# Patient Record
Sex: Male | Born: 1955
Health system: Southern US, Community
[De-identification: ages and names within clinical notes are randomized; demographics above are authoritative.]

## PROBLEM LIST (undated history)

## (undated) DIAGNOSIS — K219 Gastro-esophageal reflux disease without esophagitis: Secondary | ICD-10-CM

## (undated) DIAGNOSIS — E785 Hyperlipidemia, unspecified: Secondary | ICD-10-CM

## (undated) DIAGNOSIS — E1149 Type 2 diabetes mellitus with other diabetic neurological complication: Secondary | ICD-10-CM

## (undated) DIAGNOSIS — F419 Anxiety disorder, unspecified: Secondary | ICD-10-CM

## (undated) DIAGNOSIS — J3089 Other allergic rhinitis: Secondary | ICD-10-CM

## (undated) DIAGNOSIS — D61818 Other pancytopenia: Secondary | ICD-10-CM

## (undated) DIAGNOSIS — K8020 Calculus of gallbladder without cholecystitis without obstruction: Secondary | ICD-10-CM

## (undated) HISTORY — DX: Other pancytopenia: D61.818

## (undated) HISTORY — DX: Type 2 diabetes mellitus with other diabetic neurological complication: E11.49

## (undated) HISTORY — DX: Calculus of gallbladder without cholecystitis without obstruction: K80.20

## (undated) HISTORY — DX: Hyperlipidemia, unspecified: E78.5

## (undated) HISTORY — DX: Anxiety disorder, unspecified: F41.9

## (undated) HISTORY — DX: Gastro-esophageal reflux disease without esophagitis: K21.9

## (undated) HISTORY — PX: OTHER SURGICAL HISTORY: SHX169

## (undated) HISTORY — DX: Other allergic rhinitis: J30.89

## (undated) SURGERY — Surgical Case
Anesthesia: *Unknown

---

## 2004-12-12 ENCOUNTER — Ambulatory Visit: Payer: Self-pay | Admitting: Family Medicine

## 2004-12-15 ENCOUNTER — Ambulatory Visit: Payer: Self-pay | Admitting: Internal Medicine

## 2004-12-25 ENCOUNTER — Ambulatory Visit: Payer: Self-pay | Admitting: Rheumatology

## 2006-04-17 ENCOUNTER — Ambulatory Visit: Payer: Self-pay | Admitting: Gastroenterology

## 2008-04-14 ENCOUNTER — Ambulatory Visit: Payer: Self-pay | Admitting: Unknown Physician Specialty

## 2008-04-15 ENCOUNTER — Ambulatory Visit: Payer: Self-pay | Admitting: Gastroenterology

## 2009-07-29 ENCOUNTER — Emergency Department: Payer: Self-pay | Admitting: Emergency Medicine

## 2009-08-08 ENCOUNTER — Emergency Department: Payer: Self-pay | Admitting: Emergency Medicine

## 2010-05-29 IMAGING — CT CT HEAD WITHOUT CONTRAST
2 series · 16 of 30 positions shown, 20 images · non-contrast
Comparison: none

REASON FOR EXAM: fall AMS
COMMENTS:   May transport without cardiac monitor

[Series 2: without · axial · non-contrast · 0.45mm/px · z∈[-175,-55]mm · 13 of 30 slices shown, 17 images]
[im 3/30  brain]
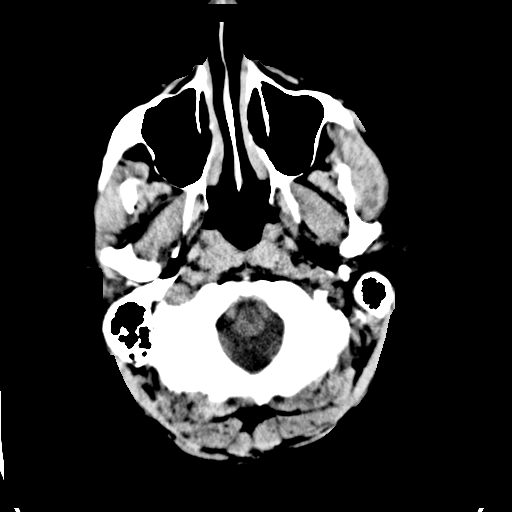
[im 3/30  bone]
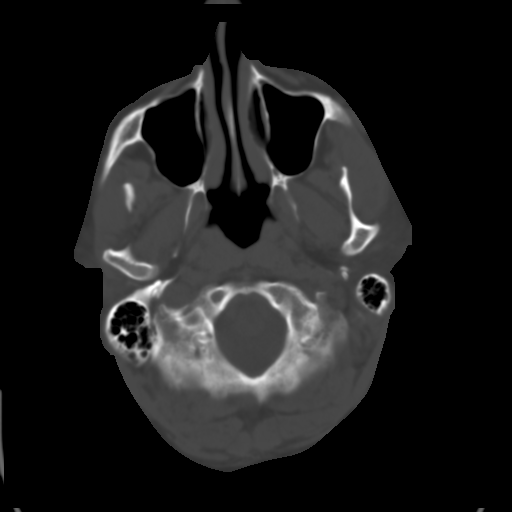
[im 5/30  brain]
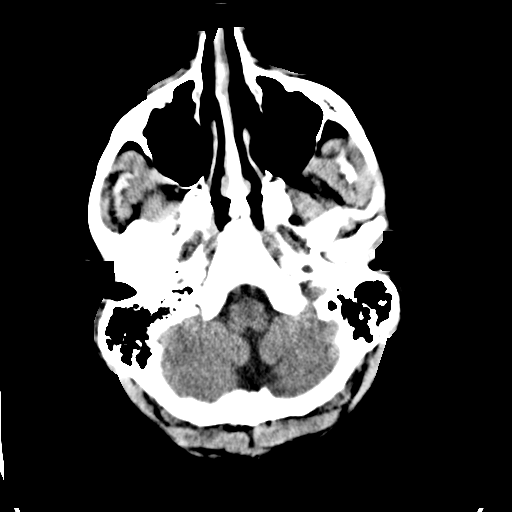
[im 7/30  brain]
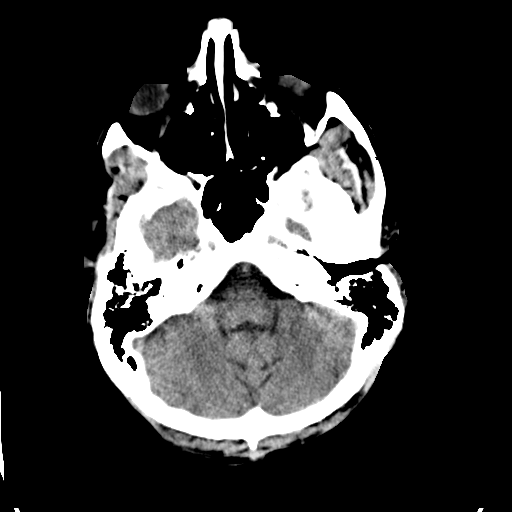
[im 9/30  brain]
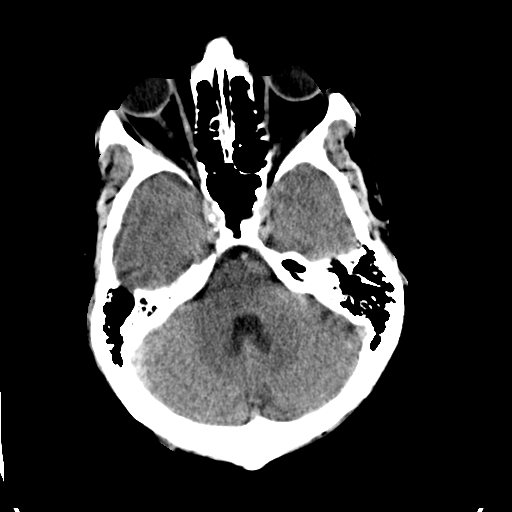
[im 11/30  brain]
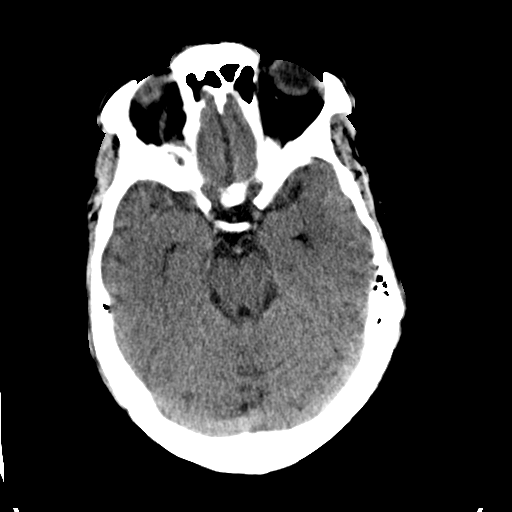
[im 11/30  bone]
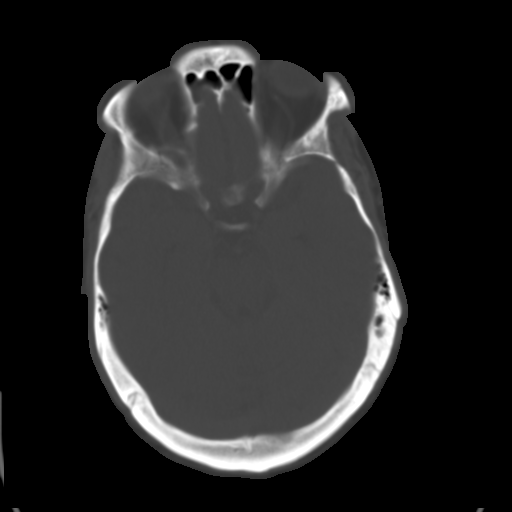
[im 13/30  brain]
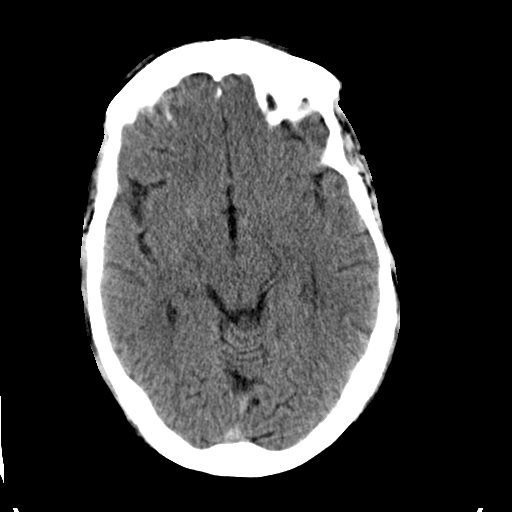
[im 15/30  brain]
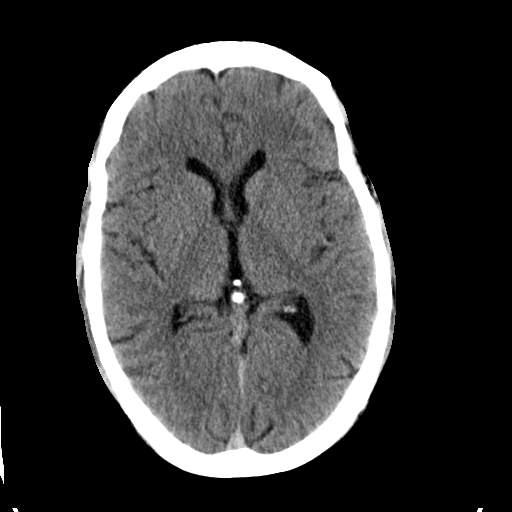
[im 17/30  brain]
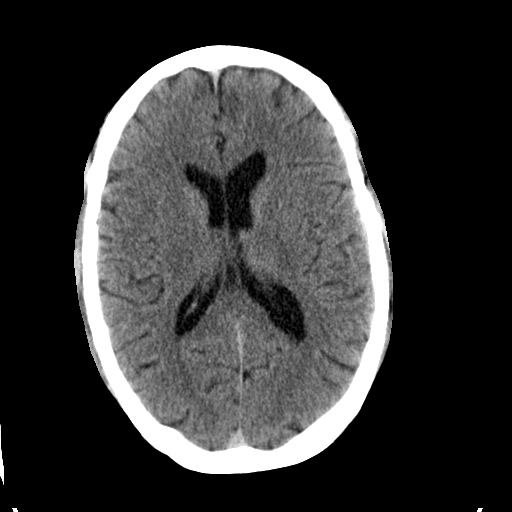
[im 19/30  brain]
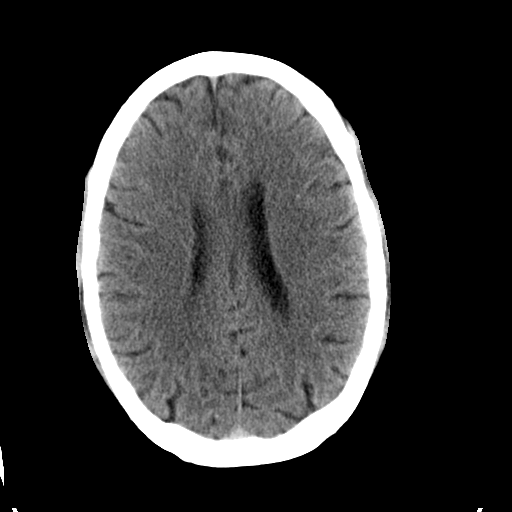
[im 19/30  bone]
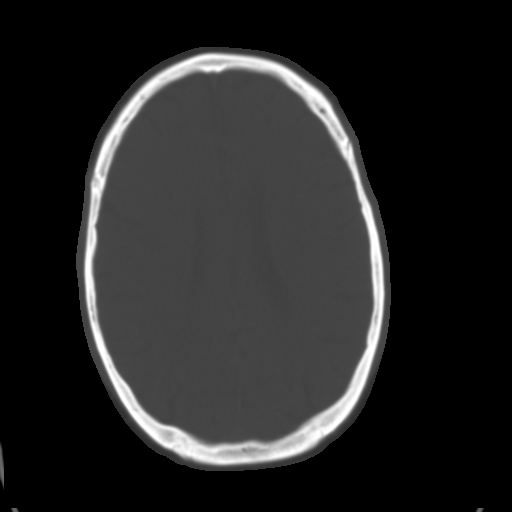
[im 21/30  brain]
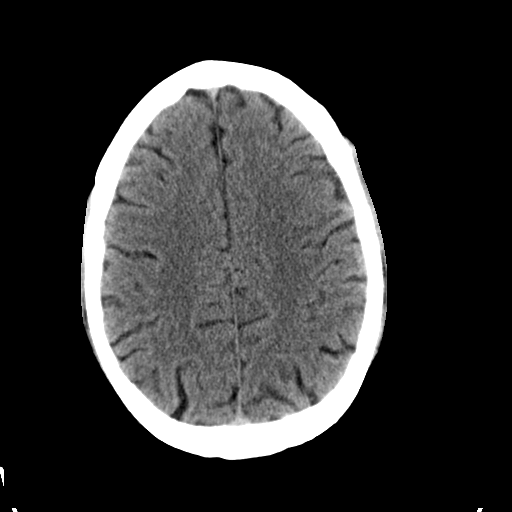
[im 23/30  brain]
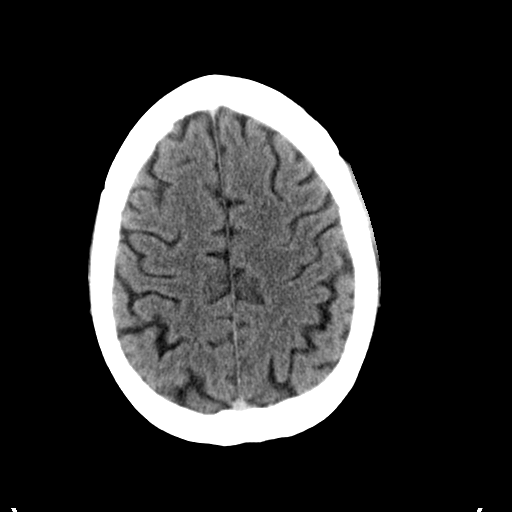
[im 25/30  brain]
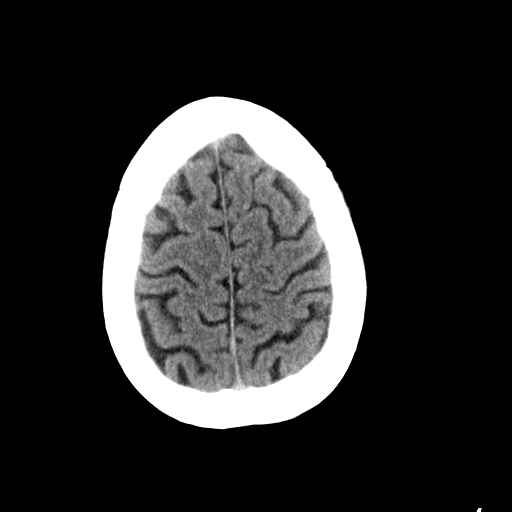
[im 27/30  brain]
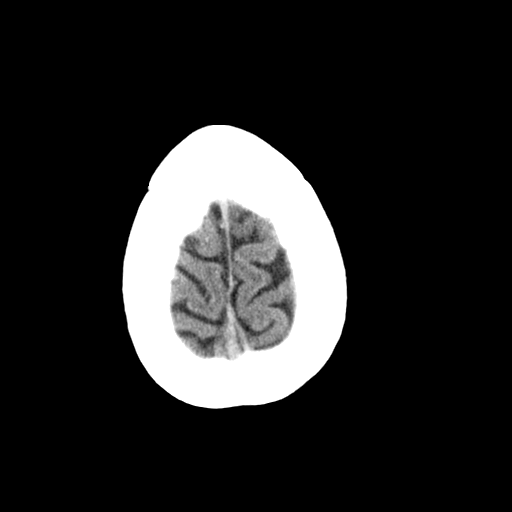
[im 27/30  bone]
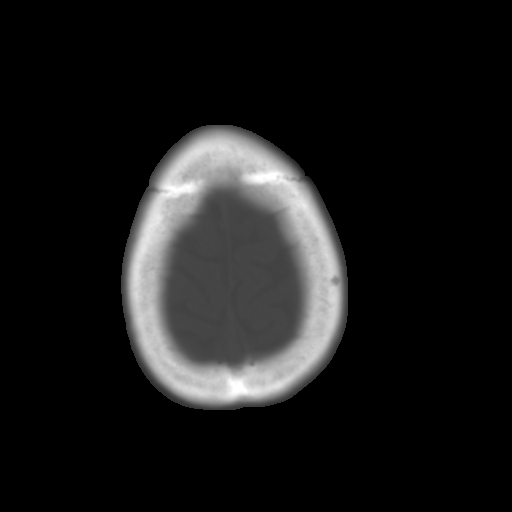

[Series 3: bone · axial · 0.45mm/px · z∈[-175,-135]mm · 3 of 30 slices shown]
[im 3/30  bone]
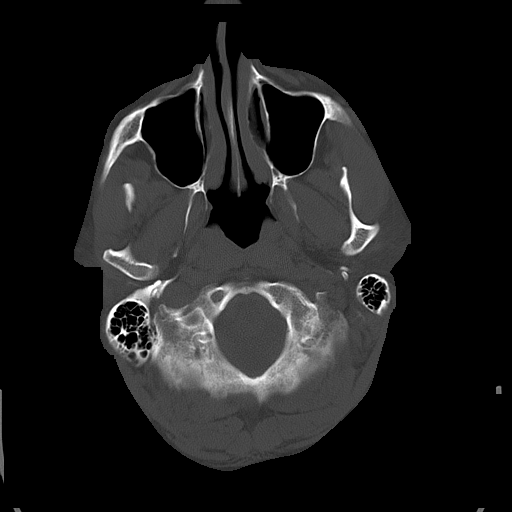
[im 7/30  bone]
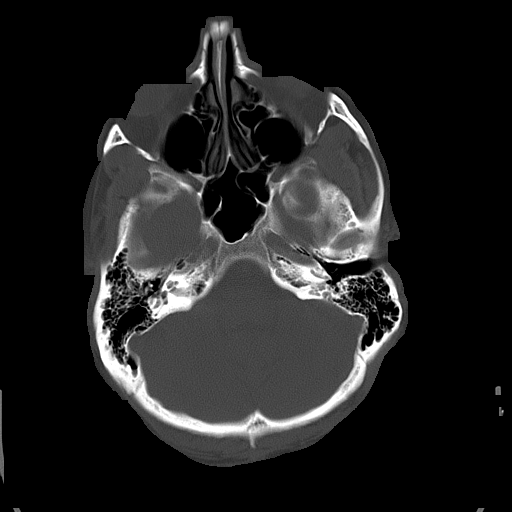
[im 11/30  bone]
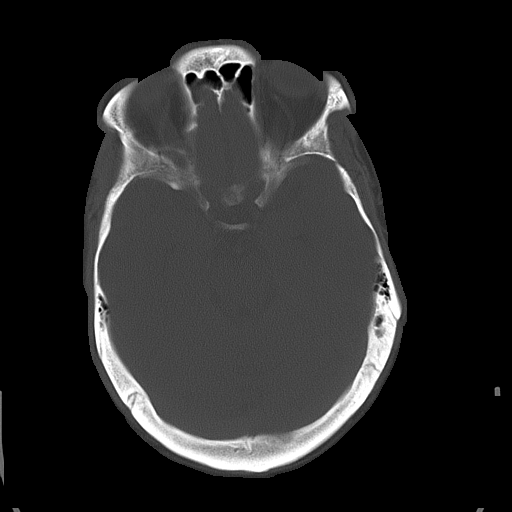

[16 of 30 positions shown; findings below may reference images not displayed]

PROCEDURE:     CT  - CT HEAD WITHOUT CONTRAST  - July 29, 2009  [DATE]

RESULT:     Noncontrast emergent CT of the brain is performed. The patient
has no previous exam for comparison.

The ventricles and sulci are normal. There is no hemorrhage. There is no
focal mass, mass-effect or midline shift. There is no evidence of edema or
territorial infarct. The bone windows demonstrate normal aeration of the
paranasal sinuses and mastoid air cells. There is no skull fracture
demonstrated.
IMPRESSION: 1. No acute intracranial abnormality.

## 2012-03-05 DIAGNOSIS — K4020 Bilateral inguinal hernia, without obstruction or gangrene, not specified as recurrent: Secondary | ICD-10-CM | POA: Insufficient documentation

## 2012-03-05 DIAGNOSIS — D400 Neoplasm of uncertain behavior of prostate: Secondary | ICD-10-CM | POA: Insufficient documentation

## 2012-03-05 DIAGNOSIS — R972 Elevated prostate specific antigen [PSA]: Secondary | ICD-10-CM | POA: Insufficient documentation

## 2012-03-05 DIAGNOSIS — N138 Other obstructive and reflux uropathy: Secondary | ICD-10-CM | POA: Insufficient documentation

## 2012-03-05 DIAGNOSIS — N200 Calculus of kidney: Secondary | ICD-10-CM | POA: Insufficient documentation

## 2012-12-13 DIAGNOSIS — F411 Generalized anxiety disorder: Secondary | ICD-10-CM | POA: Insufficient documentation

## 2014-10-08 LAB — LIPID PANEL
HDL: 37 mg/dL (ref 35–70)
LDL CALC: 99 mg/dL

## 2014-10-25 LAB — BASIC METABOLIC PANEL: POTASSIUM: 5.2 mmol/L (ref 3.4–5.3)

## 2014-11-01 ENCOUNTER — Ambulatory Visit
Admit: 2014-11-01 | Disposition: A | Discharge status: Home / Self Care | Payer: Self-pay | Attending: Family Medicine | Admitting: Family Medicine

## 2014-11-06 ENCOUNTER — Other Ambulatory Visit: Payer: Self-pay

## 2014-11-06 DIAGNOSIS — E875 Hyperkalemia: Secondary | ICD-10-CM | POA: Insufficient documentation

## 2014-11-06 DIAGNOSIS — N200 Calculus of kidney: Secondary | ICD-10-CM | POA: Insufficient documentation

## 2014-11-06 DIAGNOSIS — N529 Male erectile dysfunction, unspecified: Secondary | ICD-10-CM | POA: Insufficient documentation

## 2014-11-06 DIAGNOSIS — Z87442 Personal history of urinary calculi: Secondary | ICD-10-CM | POA: Insufficient documentation

## 2014-11-06 DIAGNOSIS — N401 Enlarged prostate with lower urinary tract symptoms: Secondary | ICD-10-CM

## 2014-11-06 DIAGNOSIS — K219 Gastro-esophageal reflux disease without esophagitis: Secondary | ICD-10-CM | POA: Insufficient documentation

## 2014-11-06 DIAGNOSIS — N138 Other obstructive and reflux uropathy: Secondary | ICD-10-CM | POA: Insufficient documentation

## 2014-11-06 DIAGNOSIS — J309 Allergic rhinitis, unspecified: Secondary | ICD-10-CM | POA: Insufficient documentation

## 2014-11-06 DIAGNOSIS — F1021 Alcohol dependence, in remission: Secondary | ICD-10-CM | POA: Insufficient documentation

## 2014-11-06 DIAGNOSIS — I10 Essential (primary) hypertension: Secondary | ICD-10-CM | POA: Insufficient documentation

## 2015-01-11 ENCOUNTER — Other Ambulatory Visit: Payer: Self-pay | Admitting: Family Medicine

## 2015-01-13 ENCOUNTER — Ambulatory Visit (INDEPENDENT_AMBULATORY_CARE_PROVIDER_SITE_OTHER): Payer: BLUE CROSS/BLUE SHIELD | Admitting: Family Medicine

## 2015-01-13 ENCOUNTER — Encounter: Payer: Self-pay | Admitting: Family Medicine

## 2015-01-13 ENCOUNTER — Other Ambulatory Visit: Payer: Self-pay | Admitting: Family Medicine

## 2015-01-13 VITALS — BP 133/81 | HR 89 | Temp 98.3°F | Resp 16 | Ht 72.0 in | Wt 204.6 lb

## 2015-01-13 DIAGNOSIS — E1149 Type 2 diabetes mellitus with other diabetic neurological complication: Secondary | ICD-10-CM

## 2015-01-13 DIAGNOSIS — K8020 Calculus of gallbladder without cholecystitis without obstruction: Secondary | ICD-10-CM

## 2015-01-13 DIAGNOSIS — E1140 Type 2 diabetes mellitus with diabetic neuropathy, unspecified: Secondary | ICD-10-CM

## 2015-01-13 DIAGNOSIS — I10 Essential (primary) hypertension: Secondary | ICD-10-CM | POA: Diagnosis not present

## 2015-01-13 DIAGNOSIS — E1169 Type 2 diabetes mellitus with other specified complication: Secondary | ICD-10-CM

## 2015-01-13 DIAGNOSIS — E785 Hyperlipidemia, unspecified: Secondary | ICD-10-CM

## 2015-01-13 LAB — POCT GLYCOSYLATED HEMOGLOBIN (HGB A1C): HEMOGLOBIN A1C: 6.7

## 2015-01-13 MED ORDER — INSULIN GLARGINE 100 UNIT/ML SOLOSTAR PEN: 28.0000 [IU] | PEN_INJECTOR | Freq: Every day | SUBCUTANEOUS | Status: DC

## 2015-01-13 NOTE — Progress Notes (Signed)
Subjective:    Patient ID: Richard Columbia Sr., male    DOB: 1955-11-19, 60 y.o.   MRN: [id number]  HPI: Richard DETERDING Sr. is a 59 y.o. male presenting on 01/13/2015 for Diabetes; Hyperlipidemia; and Hypertension  Pt was found to have a gallstones on liver ultrasound. He is having some nausea and vomitting after fatty meals. He would like a referral to a surgeon today.   Diabetes He presents for his follow-up diabetic visit. He has type 2 diabetes mellitus. His disease course has been improving. There are no hypoglycemic associated symptoms. Pertinent negatives for hypoglycemia include no headaches. Pertinent negatives for diabetes include no blurred vision, no chest pain, no polydipsia, no polyphagia, no polyuria and no visual change. His weight is stable. He is following a diabetic diet. His home blood glucose trend is decreasing steadily. His overall blood glucose range is 130-140 mg/dl. An ACE inhibitor/angiotensin II receptor blocker is being taken. He does not see a podiatrist.Eye exam is current.  Hyperlipidemia This is a chronic problem. Exacerbating diseases include liver disease. Pertinent negatives include no chest pain, leg pain, myalgias or shortness of breath.  Hypertension This is a chronic problem. The problem has been gradually improving since onset. The problem is controlled. Pertinent negatives include no blurred vision, chest pain, headaches, peripheral edema or shortness of breath. Past treatments include ACE inhibitors. The current treatment provides moderate improvement.    Past Medical History  Diagnosis Date  . Depression   . Hyperlipidemia     Current Outpatient Prescriptions on File Prior to Visit  Medication Sig  . allopurinol (ZYLOPRIM) 300 MG tablet Take 1 tablet by mouth daily.  Marland Kitchen aspirin 81 MG tablet Take 1 tablet by mouth daily at 6 (six) AM.  . benazepril (LOTENSIN) 40 MG tablet Take 1 tablet by mouth daily at 6 (six) AM.  . calcium carbonate (OS-CAL) 600  MG TABS tablet Take by mouth.  . cyanocobalamin 1000 MCG tablet Take by mouth.  . DULoxetine (CYMBALTA) 30 MG capsule Take by mouth.  . Liraglutide 18 MG/3ML SOPN Inject into the skin.  . Magnesium Gluconate 500 (27 MG) MG TABS Take by mouth.  . Melatonin 5 MG TABS Take by mouth.  . metFORMIN (GLUCOPHAGE) 1000 MG tablet Take by mouth.  . MULTIPLE VITAMIN PO Take by mouth.  . Omeprazole 20 MG TBEC Take by mouth.  . tamsulosin (FLOMAX) 0.4 MG CAPS capsule Take by mouth.  . benazepril (LOTENSIN) 40 MG tablet Take 1 tablet by mouth daily.   No current facility-administered medications on file prior to visit.    Review of Systems  Constitutional: Negative for fever and chills.  Eyes: Negative for blurred vision.  Respiratory: Negative for chest tightness, shortness of breath and wheezing.   Cardiovascular: Negative for chest pain.  Gastrointestinal: Positive for nausea and abdominal pain. Negative for vomiting.  Endocrine: Negative for cold intolerance, heat intolerance, polydipsia, polyphagia and polyuria.  Musculoskeletal: Negative for myalgias.  Neurological: Negative for light-headedness, numbness and headaches.  Psychiatric/Behavioral: Negative.    Per HPI unless specifically indicated above     Objective:    BP 133/81 mmHg  Pulse 89  Temp(Src) 98.3 F (36.8 C) (Oral)  Resp 16  Ht 6' (1.829 m)  Wt 204 lb 9.6 oz (92.806 kg)  BMI 27.74 kg/m2  Wt Readings from Last 3 Encounters:  01/13/15 204 lb 9.6 oz (92.806 kg)  10/04/14 208 lb 6.1 oz (94.521 kg)    Physical Exam  Constitutional:  He is oriented to person, place, and time. He appears well-developed and well-nourished. No distress.  Neck: Normal range of motion. Neck supple. No thyromegaly present.  Cardiovascular: Normal rate and regular rhythm.  Exam reveals no gallop and no friction rub.   No murmur heard. Pulmonary/Chest: Effort normal and breath sounds normal.  Abdominal: Soft. Bowel sounds are normal. There is no  splenomegaly or hepatomegaly. There is tenderness. There is positive Murphy's sign (mildy uncomfortable.). There is no rebound.  Musculoskeletal: Normal range of motion. He exhibits no edema or tenderness.  Lymphadenopathy:    He has no cervical adenopathy.  Neurological: He is alert and oriented to person, place, and time.  Skin: Skin is warm and dry. He is not diaphoretic.   Diabetic Foot Exam - Simple   Simple Foot Form  Diabetic Foot exam was performed with the following findings:  Yes 01/13/2015  8:58 AM  Visual Inspection  No deformities, no ulcerations, no other skin breakdown bilaterally:  Yes  Sensation Testing  See comments:  Yes  Pulse Check  Posterior Tibialis and Dorsalis pulse intact bilaterally:  Yes  Comments  Decreased sensation to vibration bilateral feet. Decreased sensation to monofilament, 3-5 toes bilateral.      Results for orders placed or performed in visit on 01/13/15  POCT HgB A1C  Result Value Ref Range   Hemoglobin A1C 6.7       Assessment & Plan:   Problem List Items Addressed This Visit      Cardiovascular and Mediastinum   Essential (primary) hypertension    Controlled. Continue current regimen. On ACE for renal protection.       Relevant Medications   furosemide (LASIX) 20 MG tablet     Endocrine   DM (diabetes mellitus), type 2 with neurological complications - Primary    Increase Lantus to 28 units. A1c is doing well at 6.7%. Continue diet and lifestyle changes.  Eye exam done in April.  Foot exam done in march and today.   Pt has peripheral neuropathy- discussed gabapentin but patient declined.  RTC 3 mos.       Relevant Medications   Insulin Glargine (LANTUS SOLOSTAR) 100 UNIT/ML Solostar Pen   Other Relevant Orders   POCT HgB A1C (Completed)   Comprehensive Metabolic Panel (CMET)   Lipid Profile     Other   Hyperlipidemia associated with type 2 diabetes mellitus    Statin stopped due to elevated liver enzymes and liver  disease. Check lipid panel today.       Relevant Medications   furosemide (LASIX) 20 MG tablet   Insulin Glargine (LANTUS SOLOSTAR) 100 UNIT/ML Solostar Pen    Other Visit Diagnoses    Calculus of gallbladder without cholecystitis without obstruction         Gallstones with some nausea after fatty meals. Pt requesting referral for surgical consult. Referred to [facility].    Relevant Orders    Ambulatory referral to General Surgery       Meds ordered this encounter  Medications  . acetaminophen (TYLENOL) 500 MG tablet    Sig: Frequency:   Dosage:0.0     Instructions:  Note:  . furosemide (LASIX) 20 MG tablet    Sig: Take 10 mg by mouth daily.    Refill:  0  . Insulin Glargine (LANTUS SOLOSTAR) 100 UNIT/ML Solostar Pen    Sig: Inject 28 Units into the skin at bedtime.    Dispense:  5 pen    Refill:  11    Order Specific Question:  Supervising Provider    Answer:  Arlis Porta [[id number]]      Follow up plan: Return in about 3 months (around 04/15/2015) for Dr. [name] for diabetes.Marland Kitchen

## 2015-01-13 NOTE — Assessment & Plan Note (Signed)
Controlled. Continue current regimen. On ACE for renal protection.  

## 2015-01-13 NOTE — Assessment & Plan Note (Signed)
Increase Lantus to 28 units. A1c is doing well at 6.7%. Continue diet and lifestyle changes.  Eye exam done in April.  Foot exam done in march and today.   Pt has peripheral neuropathy- discussed gabapentin but patient declined.  RTC 3 mos.

## 2015-01-13 NOTE — Assessment & Plan Note (Signed)
Statin stopped due to elevated liver enzymes and liver disease. Check lipid panel today.

## 2015-01-13 NOTE — Telephone Encounter (Signed)
Patient seen today

## 2015-01-13 NOTE — Patient Instructions (Addendum)
  Please check your blood glucose 2 times daily. If your glucose is < 70 mg/dl or you have symptoms of hypoglycemia confusion, dizziness, hunger, jitteriness and sweating please drink 4 oz of juice or soda.  Check blood glucose 15 minutes later. If it has not risen to >100, please seek medical attention. If > 100 please eat a snack containing protein such as peanut butter and crackers.    Cholelithiasis Cholelithiasis (also called gallstones) is a form of gallbladder disease in which gallstones form in your gallbladder. The gallbladder is an organ that stores bile made in the liver, which helps digest fats. Gallstones begin as small crystals and slowly grow into stones. Gallstone pain occurs when the gallbladder spasms and a gallstone is blocking the duct. Pain can also occur when a stone passes out of the duct.  RISK FACTORS  Being male.   Having multiple pregnancies. Health care providers sometimes advise removing diseased gallbladders before future pregnancies.   Being obese.  Eating a diet heavy in fried foods and fat.   Being older than 55 years and increasing age.   Prolonged use of medicines containing male hormones.   Having diabetes mellitus.   Rapidly losing weight.   Having a family history of gallstones (heredity).  SYMPTOMS  Nausea.   Vomiting.  Abdominal pain.   Yellowing of the skin (jaundice).   Sudden pain. It may persist from several minutes to several hours.  Fever.   Tenderness to the touch. In some cases, when gallstones do not move into the bile duct, people have no pain or symptoms. These are called "silent" gallstones.  TREATMENT Silent gallstones do not need treatment. In severe cases, emergency surgery may be required. Options for treatment include:  Surgery to remove the gallbladder. This is the most common treatment.  Medicines. These do not always work and may take 6-12 months or more to work.  Shock wave treatment  (extracorporeal biliary lithotripsy). In this treatment an ultrasound machine sends shock waves to the gallbladder to break gallstones into smaller pieces that can pass into the intestines or be dissolved by medicine. HOME CARE INSTRUCTIONS   Only take over-the-counter or prescription medicines for pain, discomfort, or fever as directed by your health care provider.   Follow a low-fat diet until seen again by your health care provider. Fat causes the gallbladder to contract, which can result in pain.   Follow up with your health care provider as directed. Attacks are almost always recurrent and surgery is usually required for permanent treatment.  SEEK IMMEDIATE MEDICAL CARE IF:   Your pain increases and is not controlled by medicines.   You have a fever or persistent symptoms for more than 2-3 days.   You have a fever and your symptoms suddenly get worse.   You have persistent nausea and vomiting.  MAKE SURE YOU:   Understand these instructions.  Will watch your condition.  Will get help right away if you are not doing well or get worse. Document Released: 06/21/2005 Document Revised: 02/25/2013 Document Reviewed: 12/17/2012 [facility] Patient Information 2015 [place], [place]. This information is not intended to replace advice given to you by your health care provider. Make sure you discuss any questions you have with your health care provider.

## 2015-01-14 ENCOUNTER — Telehealth: Payer: Self-pay | Admitting: Family Medicine

## 2015-01-14 NOTE — Telephone Encounter (Signed)
Richard Day called saying he was returning your call in regards to surgical information. He said he'll call back later unless you want to try him again. Thank you.

## 2015-01-14 NOTE — Telephone Encounter (Signed)
Pt has appointment on 01/20/2015 at 2:00 pm needs to arrive at 1:45 pm with ID, Insurance card, Specialist co pay, original medication bottle and Dr. Angie Fava phone number is [phone] just in case if you have to reschedule. Tx Nisha

## 2015-01-17 ENCOUNTER — Telehealth: Payer: Self-pay | Admitting: Family Medicine

## 2015-01-17 DIAGNOSIS — K8020 Calculus of gallbladder without cholecystitis without obstruction: Secondary | ICD-10-CM

## 2015-01-17 DIAGNOSIS — R110 Nausea: Secondary | ICD-10-CM

## 2015-01-17 MED ORDER — ONDANSETRON HCL 4 MG PO TABS: 4.0000 mg | ORAL_TABLET | Freq: Three times a day (TID) | ORAL | Status: DC | PRN

## 2015-01-17 NOTE — Telephone Encounter (Signed)
Pt has a consultation with surgeon on Thursday for gall bladder surgery.  He has been very nauseous and was taking OTC medication but its getting worse and ask if you could call something stronger in for him.  He uses [facility] in [place].

## 2015-01-17 NOTE — Telephone Encounter (Signed)
Gave pt Zofran 4mg  q8hrs PRN. Called pt go over prescription.

## 2015-01-20 ENCOUNTER — Ambulatory Visit (INDEPENDENT_AMBULATORY_CARE_PROVIDER_SITE_OTHER): Payer: BLUE CROSS/BLUE SHIELD | Admitting: General Surgery

## 2015-01-20 ENCOUNTER — Encounter: Payer: Self-pay | Admitting: General Surgery

## 2015-01-20 VITALS — BP 128/72 | HR 80 | Resp 14 | Ht 72.0 in | Wt 202.0 lb

## 2015-01-20 DIAGNOSIS — K8020 Calculus of gallbladder without cholecystitis without obstruction: Secondary | ICD-10-CM

## 2015-01-20 DIAGNOSIS — R110 Nausea: Secondary | ICD-10-CM | POA: Diagnosis not present

## 2015-01-20 MED ORDER — ONDANSETRON HCL 4 MG PO TABS: 4.0000 mg | ORAL_TABLET | Freq: Three times a day (TID) | ORAL | Status: DC | PRN

## 2015-01-20 NOTE — Progress Notes (Signed)
Patient ID: Richard Columbia Sr., male   DOB: 17-Aug-1955, 59 y.o.   MRN: [id number]  Chief Complaint  Patient presents with  . Other    gallstones    HPI Richard MAESE Sr. is a 59 y.o. male here for evalation of gallstones. He had an abdominal ultrasound done at [facility] on 11/01/14 to check his liver. He reports nausea in the last month that seems to be worsening. He reports no other GI problems. Patient has had GERD and had a prior endoscopy.  HPI  Past Medical History  Diagnosis Date  . Depression   . Hyperlipidemia   . Diabetes mellitus without complication     Past Surgical History  Procedure Laterality Date  . Lung collapse  59 years old.     Family History  Problem Relation Age of Onset  . Diabetes Father     complications of DM caused death  . Diabetes Sister   . Cancer Mother     Social History History  Substance Use Topics  . Smoking status: Former Smoker    Quit date: 08/08/1999  . Smokeless tobacco: Never Used  . Alcohol Use: 0.0 oz/week    0 Standard drinks or equivalent per week     Comment: Has requested Antabuse at previous PCP visit.     No Known Allergies  Current Outpatient Prescriptions  Medication Sig Dispense Refill  . allopurinol (ZYLOPRIM) 300 MG tablet Take 1 tablet by mouth daily.    Marland Kitchen aspirin 81 MG tablet Take 1 tablet by mouth daily at 6 (six) AM.    . benazepril (LOTENSIN) 40 MG tablet Take 1 tablet by mouth daily at 6 (six) AM.    . benazepril (LOTENSIN) 40 MG tablet Take 1 tablet by mouth daily.    . cyanocobalamin 1000 MCG tablet Take by mouth.    . DULoxetine (CYMBALTA) 30 MG capsule Take by mouth.    . Insulin Glargine (LANTUS SOLOSTAR) 100 UNIT/ML Solostar Pen Inject 28 Units into the skin at bedtime. 5 pen 11  . Liraglutide 18 MG/3ML SOPN Inject into the skin.    . Magnesium Gluconate 500 (27 MG) MG TABS Take by mouth.    . Melatonin 5 MG TABS Take by mouth.    . metFORMIN (GLUCOPHAGE) 1000 MG tablet TAKE 1 BY MOUTH TWICE DAILY 180  tablet 3  . MULTIPLE VITAMIN PO Take by mouth.    . Omeprazole 20 MG TBEC Take by mouth.    . ondansetron (ZOFRAN) 4 MG tablet Take 1 tablet (4 mg total) by mouth every 8 (eight) hours as needed for nausea or vomiting. 20 tablet 0  . tamsulosin (FLOMAX) 0.4 MG CAPS capsule Take by mouth.    . ondansetron (ZOFRAN) 4 MG tablet Take 1 tablet (4 mg total) by mouth every 8 (eight) hours as needed for nausea or vomiting. 20 tablet 0   No current facility-administered medications for this visit.    Review of Systems Review of Systems  Constitutional: Negative.   Respiratory: Negative.   Cardiovascular: Negative.   Gastrointestinal: Positive for nausea. Negative for vomiting, abdominal pain, diarrhea, constipation, blood in stool, abdominal distention, anal bleeding and rectal pain.    Blood pressure 128/72, pulse 80, resp. rate 14, height 6' (1.829 m), weight 202 lb (91.627 kg).  Physical Exam Physical Exam  Constitutional: He is oriented to person, place, and time. He appears well-developed and well-nourished.  Eyes: Conjunctivae are normal. No scleral icterus.  Neck: Neck supple.  Cardiovascular:  Normal rate, regular rhythm and normal heart sounds.   Pulmonary/Chest: Effort normal and breath sounds normal.  Abdominal: Soft. Normal appearance and bowel sounds are normal. There is no hepatomegaly. There is no tenderness.  Lymphadenopathy:    He has no cervical adenopathy.  Neurological: He is alert and oriented to person, place, and time.  Skin: Skin is warm and dry.  Psychiatric: He has a normal mood and affect.    Data Reviewed [place]  Assessment    Incidental finding of gallstones. Nausea alone does not suggest a gallbladder source. Pt has history of reflux, has had prior endoscopy yrs ago. Do not feel he needs cholecystectomy given current symptom of nausea alone. Endoscopy is reasonable.    Plan   Discussed upper endoscopy with pt and he is agreeable.Procedure, risks and  benefits discussed. Patient has been scheduled for an upper endoscopy on 02-02-15 at [facility]. This patient has been asked to decrease current 325 mg aspirin to 81 mg aspirin starting one week prior to procedure. He will hold all diabetic medications the day of procedure.       Ref: Corinne Ports 01/21/2015, 4:02 PM

## 2015-01-20 NOTE — Patient Instructions (Addendum)
Esophagogastroduodenoscopy Esophagogastroduodenoscopy (EGD) is a procedure to examine the lining of the esophagus, stomach, and first part of the small intestine (duodenum). A long, flexible, lighted tube with a camera attached (endoscope) is inserted down the throat to view these organs. This procedure is done to detect problems or abnormalities, such as inflammation, bleeding, ulcers, or growths, in order to treat them. The procedure lasts about 5-20 minutes. It is usually an outpatient procedure, but it may need to be performed in emergency cases in the hospital. LET YOUR CAREGIVER KNOW ABOUT:   Allergies to food or medicine.  All medicines you are taking, including vitamins, herbs, eyedrops, and over-the-counter medicines and creams.  Use of steroids (by mouth or creams).  Previous problems you or members of your family have had with the use of anesthetics.  Any blood disorders you have.  Previous surgeries you have had.  Other health problems you have.  Possibility of pregnancy, if this applies. RISKS AND COMPLICATIONS  Generally, EGD is a safe procedure. However, as with any procedure, complications can occur. Possible complications include:  Infection.  Bleeding.  Tearing (perforation) of the esophagus, stomach, or duodenum.  Difficulty breathing or not being able to breath.  Excessive sweating.  Spasms of the larynx.  Slowed heartbeat.  Low blood pressure. BEFORE THE PROCEDURE  Do not eat or drink anything for 6-8 hours before the procedure or as directed by your caregiver.  Ask your caregiver about changing or stopping your regular medicines.  If you wear dentures, be prepared to remove them before the procedure.  Arrange for someone to drive you home after the procedure. PROCEDURE   A vein will be accessed to give medicines and fluids. A medicine to relax you (sedative) and a pain reliever will be given through that access into the vein.  A numbing medicine  (local anesthetic) may be sprayed on your throat for comfort and to stop you from gagging or coughing.  A mouth guard may be placed in your mouth to protect your teeth and to keep you from biting on the endoscope.  You will be asked to lie on your left side.  The endoscope is inserted down your throat and into the esophagus, stomach, and duodenum.  Air is put through the endoscope to allow your caregiver to view the lining of your esophagus clearly.  The esophagus, stomach, and duodenum is then examined. During the exam, your caregiver may:  Remove tissue to be examined under a microscope (biopsy) for inflammation, infection, or other medical problems.  Remove growths.  Remove objects (foreign bodies) that are stuck.  Treat any bleeding with medicines or other devices that stop tissues from bleeding (hot cautery, clipping devices).  Widen (dilate) or stretch narrowed areas of the esophagus and stomach.  The endoscope will then be withdrawn. AFTER THE PROCEDURE  You will be taken to a recovery area to be monitored. You will be able to go home once you are stable and alert.  Do not eat or drink anything until the local anesthetic and numbing medicines have worn off. You may choke.  It is normal to feel bloated, have pain with swallowing, or have a sore throat for a short time. This will wear off.  Your caregiver should be able to discuss his or her findings with you. It will take longer to discuss the test results if any biopsies were taken. Document Released: 10/26/2004 Document Revised: 11/09/2013 Document Reviewed: 05/28/2012 [facility] Patient Information 2015 [place], [place]. This information is not  intended to replace advice given to you by your health care provider. Make sure you discuss any questions you have with your health care provider.  Patient has been scheduled for an upper endoscopy on 02-02-15 at [facility]. This patient has been asked to decrease current 325 mg aspirin to 81  mg aspirin starting one week prior to procedure. He will hold all diabetic medications the day of procedure.

## 2015-01-21 ENCOUNTER — Encounter: Payer: Self-pay | Admitting: General Surgery

## 2015-01-21 ENCOUNTER — Telehealth: Payer: Self-pay | Admitting: Family Medicine

## 2015-01-21 LAB — COMPREHENSIVE METABOLIC PANEL
A/G RATIO: 1.8 (ref 1.1–2.5)
ALBUMIN: 4.8 g/dL (ref 3.6–4.8)
ALK PHOS: 69 IU/L (ref 39–117)
ALT: 65 IU/L — ABNORMAL HIGH (ref 0–44)
AST: 64 IU/L — ABNORMAL HIGH (ref 0–40)
BILIRUBIN TOTAL: 0.6 mg/dL (ref 0.0–1.2)
BUN/Creatinine Ratio: 10 (ref 10–22)
BUN: 10 mg/dL (ref 8–27)
CHLORIDE: 95 mmol/L — AB (ref 97–108)
CO2: 25 mmol/L (ref 18–29)
Calcium: 9.9 mg/dL (ref 8.6–10.2)
Creatinine, Ser: 1.01 mg/dL (ref 0.76–1.27)
GFR calc Af Amer: 93 mL/min/{1.73_m2} (ref >59)
GFR calc non Af Amer: 80 mL/min/{1.73_m2} (ref >59)
Globulin, Total: 2.7 g/dL (ref 1.5–4.5)
Glucose: 115 mg/dL — ABNORMAL HIGH (ref 65–99)
POTASSIUM: 4.4 mmol/L (ref 3.5–5.2)
Sodium: 139 mmol/L (ref 134–144)
Total Protein: 7.5 g/dL (ref 6.0–8.5)

## 2015-01-21 LAB — LIPID PANEL
CHOL/HDL RATIO: 5.1 ratio — AB (ref 0.0–5.0)
Cholesterol, Total: 226 mg/dL — ABNORMAL HIGH (ref 100–199)
HDL: 44 mg/dL (ref >39)
LDL CALC: 153 mg/dL — AB (ref 0–99)
TRIGLYCERIDES: 145 mg/dL (ref 0–149)
VLDL CHOLESTEROL CAL: 29 mg/dL (ref 5–40)

## 2015-01-21 NOTE — Telephone Encounter (Signed)
Called pt to review recent labs. LMCTB.

## 2015-01-24 ENCOUNTER — Encounter: Payer: Self-pay | Admitting: Family Medicine

## 2015-01-24 NOTE — Telephone Encounter (Signed)
Called x2, nisha also called on Friday. Will mail patient a letter with results.

## 2015-01-25 ENCOUNTER — Encounter: Payer: Self-pay | Admitting: General Surgery

## 2015-01-25 ENCOUNTER — Telehealth: Payer: Self-pay | Admitting: Family Medicine

## 2015-01-25 ENCOUNTER — Telehealth: Payer: Self-pay | Admitting: General Surgery

## 2015-01-25 DIAGNOSIS — E785 Hyperlipidemia, unspecified: Secondary | ICD-10-CM

## 2015-01-25 MED ORDER — EZETIMIBE 10 MG PO TABS: 10.0000 mg | ORAL_TABLET | Freq: Every day | ORAL | Status: DC

## 2015-01-25 NOTE — Telephone Encounter (Signed)
Reviewed labs with patient. He would like to try Zetia or cholesterol. Recheck in 3 mos.

## 2015-01-25 NOTE — Telephone Encounter (Signed)
PT CALLED CANCELLED HIS UPPER ENDOSCOPY FOR 02-02-15.STATES DR Jamal Collin PRESCRIBED ANTI-NAUSEA MEDS & NOW IS FEELING BETTER AND HAS NO SYMPTOMS.HE DOESN'T WANT TO ACTION AT THIS TIME.  CARYL-LYN AWARE TO CX OF ENDO [facility].

## 2015-01-25 NOTE — Addendum Note (Signed)
Addended byVincenza Hews, Shabreka Coulon L on: 01/25/2015 01:15 PM   Modules accepted: Orders

## 2015-01-26 NOTE — Telephone Encounter (Signed)
Bonnita Nasuti is calling regarding what test was order for Richard Day so she can get 100 $ reimbursement from [employer] and I don't see designated party release from [place] for [employer] from our office so requested that If Richard Day call or sign paper for release information I will be happy to give her information or have Pt call here and will relay information because of privacy of pt .

## 2015-01-31 ENCOUNTER — Other Ambulatory Visit: Payer: Self-pay | Admitting: Family Medicine

## 2015-02-02 ENCOUNTER — Ambulatory Visit: Admit: 2015-02-02 | Payer: Self-pay | Admitting: General Surgery

## 2015-02-02 SURGERY — EGD (ESOPHAGOGASTRODUODENOSCOPY)
Anesthesia: General

## 2015-02-07 ENCOUNTER — Other Ambulatory Visit: Payer: Self-pay | Admitting: Family Medicine

## 2015-03-04 ENCOUNTER — Other Ambulatory Visit: Payer: Self-pay | Admitting: Family Medicine

## 2015-03-17 ENCOUNTER — Telehealth: Payer: Self-pay | Admitting: Family Medicine

## 2015-03-17 DIAGNOSIS — K8020 Calculus of gallbladder without cholecystitis without obstruction: Secondary | ICD-10-CM

## 2015-03-17 NOTE — Telephone Encounter (Signed)
Pt asked for a refill on Zofran 4 mg sent to [facility] on [address].  He has gall stones and needs a referral for surgery.  He does not want to see Dr. Jamal Collin.  His call back number is [phone]

## 2015-03-18 MED ORDER — ONDANSETRON HCL 4 MG PO TABS: 4.0000 mg | ORAL_TABLET | Freq: Three times a day (TID) | ORAL | Status: DC | PRN

## 2015-03-18 NOTE — Telephone Encounter (Signed)
Richard Day sent to [facility] in July and [facility] gave the Zofran and Richard Day has also. He now wants to see a different surgeon but should he be seen for all of this? Refill and Referral?

## 2015-03-18 NOTE — Telephone Encounter (Signed)
Refilled and ordered referral. Pat Richard Day will call them to schedule and I left detailed message to check pharmacy for meds that were sent and to look for a call from [place].[facility]

## 2015-03-18 NOTE — Telephone Encounter (Signed)
OK to refill Zofran x 1.  OK to refer to [facility].  Whichever surgeon can see him.  I have no preference.-jh

## 2015-03-21 NOTE — Telephone Encounter (Signed)
Called patient to give him [facility] contact info [phone]. They have been trying to call patient to schedule.

## 2015-03-31 ENCOUNTER — Encounter: Payer: Self-pay | Admitting: *Deleted

## 2015-04-04 ENCOUNTER — Ambulatory Visit: Payer: BLUE CROSS/BLUE SHIELD | Admitting: Surgery

## 2015-04-13 ENCOUNTER — Other Ambulatory Visit: Payer: Self-pay

## 2015-04-13 DIAGNOSIS — K219 Gastro-esophageal reflux disease without esophagitis: Secondary | ICD-10-CM

## 2015-04-13 DIAGNOSIS — F329 Major depressive disorder, single episode, unspecified: Secondary | ICD-10-CM

## 2015-04-13 DIAGNOSIS — I10 Essential (primary) hypertension: Secondary | ICD-10-CM

## 2015-04-13 DIAGNOSIS — F32A Depression, unspecified: Secondary | ICD-10-CM

## 2015-04-13 DIAGNOSIS — E119 Type 2 diabetes mellitus without complications: Secondary | ICD-10-CM

## 2015-04-13 MED ORDER — OMEPRAZOLE 20 MG PO CPDR: 1.0000 mg | DELAYED_RELEASE_CAPSULE | Freq: Every day | ORAL | Status: DC

## 2015-04-13 MED ORDER — INSULIN PEN NEEDLE 29G X 12.7MM MISC: 1.0000 "pen " | Freq: Two times a day (BID) | Status: DC

## 2015-04-13 MED ORDER — LIRAGLUTIDE 18 MG/3ML ~~LOC~~ SOPN: 1.8000 mg | PEN_INJECTOR | Freq: Every day | SUBCUTANEOUS | Status: DC

## 2015-04-13 MED ORDER — BENAZEPRIL HCL 40 MG PO TABS: 40.0000 mg | ORAL_TABLET | Freq: Every day | ORAL | Status: DC

## 2015-04-13 MED ORDER — METFORMIN HCL 1000 MG PO TABS: 1000.0000 mg | ORAL_TABLET | Freq: Two times a day (BID) | ORAL | Status: DC

## 2015-04-13 MED ORDER — DULOXETINE HCL 30 MG PO CPEP: 30.0000 mg | ORAL_CAPSULE | Freq: Every day | ORAL | Status: DC

## 2015-04-13 MED ORDER — INSULIN GLARGINE 100 UNIT/ML SOLOSTAR PEN: 28.0000 [IU] | PEN_INJECTOR | Freq: Every day | SUBCUTANEOUS | Status: DC

## 2015-04-13 MED ORDER — GLUCOSE BLOOD VI STRP: ORAL_STRIP | Status: DC

## 2015-04-13 NOTE — Telephone Encounter (Signed)
Jamie.   Try to res[facility]

## 2015-04-19 ENCOUNTER — Other Ambulatory Visit: Payer: Self-pay | Admitting: Family Medicine

## 2015-04-19 DIAGNOSIS — E119 Type 2 diabetes mellitus without complications: Secondary | ICD-10-CM

## 2015-04-19 MED ORDER — INSULIN GLARGINE 100 UNIT/ML SOLOSTAR PEN: 28.0000 [IU] | PEN_INJECTOR | Freq: Every day | SUBCUTANEOUS | Status: DC

## 2015-09-01 ENCOUNTER — Other Ambulatory Visit: Payer: Self-pay | Admitting: Family Medicine

## 2015-09-01 DIAGNOSIS — I10 Essential (primary) hypertension: Secondary | ICD-10-CM

## 2015-09-01 MED ORDER — BENAZEPRIL HCL 40 MG PO TABS: 40.0000 mg | ORAL_TABLET | Freq: Every day | ORAL | Status: DC

## 2015-09-01 NOTE — Telephone Encounter (Signed)
Pt needs a refill on benazepril to go to [facility] instead of [facility].  His call back number is [phone]

## 2015-11-28 ENCOUNTER — Other Ambulatory Visit: Payer: Self-pay | Admitting: Family Medicine

## 2016-03-09 ENCOUNTER — Telehealth: Payer: Self-pay | Admitting: Family Medicine

## 2016-03-09 NOTE — Telephone Encounter (Signed)
Pt lost his insurance and is out of victoza and lantus solostar pen.  Do we have any discount cards?  His call back number is [phone]

## 2016-03-09 NOTE — Telephone Encounter (Signed)
Pt has appointment scheduled

## 2016-03-13 ENCOUNTER — Ambulatory Visit (INDEPENDENT_AMBULATORY_CARE_PROVIDER_SITE_OTHER): Payer: Self-pay | Admitting: Family Medicine

## 2016-03-13 ENCOUNTER — Encounter: Payer: Self-pay | Admitting: Family Medicine

## 2016-03-13 VITALS — BP 137/75 | HR 101 | Temp 98.6°F | Resp 16 | Ht 71.0 in | Wt 207.0 lb

## 2016-03-13 DIAGNOSIS — K219 Gastro-esophageal reflux disease without esophagitis: Secondary | ICD-10-CM

## 2016-03-13 DIAGNOSIS — F329 Major depressive disorder, single episode, unspecified: Secondary | ICD-10-CM

## 2016-03-13 DIAGNOSIS — E1149 Type 2 diabetes mellitus with other diabetic neurological complication: Secondary | ICD-10-CM

## 2016-03-13 DIAGNOSIS — F32A Depression, unspecified: Secondary | ICD-10-CM

## 2016-03-13 DIAGNOSIS — F418 Other specified anxiety disorders: Secondary | ICD-10-CM

## 2016-03-13 DIAGNOSIS — E119 Type 2 diabetes mellitus without complications: Secondary | ICD-10-CM

## 2016-03-13 DIAGNOSIS — F419 Anxiety disorder, unspecified: Secondary | ICD-10-CM

## 2016-03-13 DIAGNOSIS — I10 Essential (primary) hypertension: Secondary | ICD-10-CM

## 2016-03-13 LAB — POCT GLYCOSYLATED HEMOGLOBIN (HGB A1C): HEMOGLOBIN A1C: 11.2

## 2016-03-13 MED ORDER — CITALOPRAM HYDROBROMIDE 20 MG PO TABS: 20.0000 mg | ORAL_TABLET | Freq: Every day | ORAL | 3 refills | Status: DC

## 2016-03-13 MED ORDER — LISINOPRIL 40 MG PO TABS: 40.0000 mg | ORAL_TABLET | Freq: Every day | ORAL | 3 refills | Status: DC

## 2016-03-13 MED ORDER — RANITIDINE HCL 150 MG PO CAPS: 150.0000 mg | ORAL_CAPSULE | Freq: Two times a day (BID) | ORAL | 3 refills | Status: DC

## 2016-03-13 MED ORDER — BENAZEPRIL HCL 40 MG PO TABS: 40.0000 mg | ORAL_TABLET | Freq: Every day | ORAL | 3 refills | Status: DC

## 2016-03-13 MED ORDER — "INSULIN SYRINGE 30G X 1/2"" 0.5 ML MISC": 1.0000 | Freq: Two times a day (BID) | 5 refills | Status: DC

## 2016-03-13 MED ORDER — GLIMEPIRIDE 1 MG PO TABS: 1.0000 mg | ORAL_TABLET | Freq: Every day | ORAL | 3 refills | Status: DC

## 2016-03-13 MED ORDER — INSULIN NPH (HUMAN) (ISOPHANE) 100 UNIT/ML ~~LOC~~ SUSP: 10.0000 [IU] | Freq: Two times a day (BID) | SUBCUTANEOUS | 11 refills | Status: DC

## 2016-03-13 MED ORDER — METFORMIN HCL 1000 MG PO TABS: 1000.0000 mg | ORAL_TABLET | Freq: Two times a day (BID) | ORAL | 3 refills | Status: DC

## 2016-03-13 NOTE — Patient Instructions (Signed)
Let's change to NPH insulin: Add 2 units of NPH every 7 days for a fast blood sugar > 150.  Please check sugars closely as the glimepiride can make your sugar low.   Please check your blood glucose 1-2 times daily. If your glucose is < 70 mg/dl or you have symptoms of hypoglycemia dizziness, headache, hunger, jitteriness and sweating please drink 4 oz of juice or soda.  Check blood glucose 15 minutes later. If it has not risen to >100, please seek medical attention. If > 100 please eat a snack containing protein such as peanut butter and crackers.   We will switch to celexa due to cost. Try breaking the tablet in 1/2 for the first 2-4 days to help with side effects.

## 2016-03-13 NOTE — Progress Notes (Signed)
Subjective:    Patient ID: Richard Columbia Sr., male    DOB: [birth year], 60 y.o.   MRN: [id number]  HPI: Richard HEBER Sr. is a 60 y.o. male presenting on 03/13/2016 for Medication Refill (Medication Update) and Diabetes (Follow up)   HPI  Pt presents for diabetes follow-up. He has not been here in over 1 year. His last A1c was 6.7%. His A1c today is 11.2% He lost his insurance and has been out of Lantus and Victoza for 2 weeks.  He is taking metformin regularly. He had noticed his sugars were increasing.  No change in vision. No numbness or tingling in his feet.  No chest pain. No shortness of breath. Taking benazepril daily.  Needs refills on all of his medications today.   Past Medical History:  Diagnosis Date  . Cholelithiasis   . Depression   . Diabetes mellitus without complication ([place])   . GERD (gastroesophageal reflux disease)   . Hyperlipidemia   . Hypertension     Current Outpatient Prescriptions on File Prior to Visit  Medication Sig  . allopurinol (ZYLOPRIM) 300 MG tablet Take 1 tablet by mouth daily.  Marland Kitchen aspirin 81 MG tablet Take 1 tablet by mouth daily at 6 (six) AM.  . cyanocobalamin 1000 MCG tablet Take by mouth.  Marland Kitchen glucose blood (BAYER CONTOUR TEST) test strip Use as instructed (Patient not taking: Reported on 03/13/2016)  . Magnesium Gluconate 500 (27 MG) MG TABS Take by mouth.  . Melatonin 5 MG TABS Take by mouth.  . MULTIPLE VITAMIN PO Take by mouth.  . tamsulosin (FLOMAX) 0.4 MG CAPS capsule Take by mouth.   No current facility-administered medications on file prior to visit.     Review of Systems  Constitutional: Negative for chills and fever.  HENT: Negative.   Respiratory: Negative for chest tightness, shortness of breath and wheezing.   Cardiovascular: Negative for chest pain, palpitations and leg swelling.  Gastrointestinal: Negative for abdominal pain, nausea and vomiting.  Endocrine: Negative.   Genitourinary: Negative for discharge, dysuria,  penile pain, testicular pain and urgency.  Musculoskeletal: Negative for arthralgias, back pain and joint swelling.  Skin: Negative.   Neurological: Negative for dizziness, weakness, numbness and headaches.  Psychiatric/Behavioral: Negative for dysphoric mood and sleep disturbance.   Per HPI unless specifically indicated above     Objective:    BP 137/75 (BP Location: Left Arm, Patient Position: Sitting, Cuff Size: Normal)   Pulse (!) 101   Temp 98.6 F (37 C)   Resp 16   Ht 5\' 11"  (1.803 m)   Wt 207 lb (93.9 kg)   BMI 28.87 kg/m   Wt Readings from Last 3 Encounters:  03/13/16 207 lb (93.9 kg)  01/20/15 202 lb (91.6 kg)  01/13/15 204 lb 9.6 oz (92.8 kg)    Physical Exam  Constitutional: He is oriented to person, place, and time. He appears well-developed and well-nourished. No distress.  HENT:  Head: Normocephalic and atraumatic.  Neck: Neck supple. No thyromegaly present.  Cardiovascular: Normal rate, regular rhythm and normal heart sounds.  Exam reveals no gallop and no friction rub.   No murmur heard. Pulmonary/Chest: Effort normal and breath sounds normal. He has no wheezes.  Abdominal: Soft. Bowel sounds are normal. He exhibits no distension. There is no tenderness. There is no rebound.  Musculoskeletal: Normal range of motion. He exhibits no edema or tenderness.  Neurological: He is alert and oriented to person, place, and time. He has normal  reflexes.  Skin: Skin is warm and dry. No rash noted. No erythema.  Psychiatric: He has a normal mood and affect. His behavior is normal. Thought content normal.   Diabetic Foot Exam - Simple   Simple Foot Form Diabetic Foot exam was performed with the following findings:  Yes 03/13/2016  4:09 PM  Visual Inspection No deformities, no ulcerations, no other skin breakdown bilaterally:  Yes Sensation Testing Intact to touch and monofilament testing bilaterally:  Yes Pulse Check Posterior Tibialis and Dorsalis pulse intact  bilaterally:  Yes Comments     Results for orders placed or performed in visit on 01/13/15  Comprehensive Metabolic Panel (CMET)  Result Value Ref Range   Glucose 115 (H) 65 - 99 mg/dL   BUN 10 8 - 27 mg/dL   Creatinine, Ser 1.01 0.76 - 1.27 mg/dL   GFR calc non Af Amer 80 >59 mL/min/1.73   GFR calc Af Amer 93 >59 mL/min/1.73   BUN/Creatinine Ratio 10 10 - 22   Sodium 139 134 - 144 mmol/L   Potassium 4.4 3.5 - 5.2 mmol/L   Chloride 95 (L) 97 - 108 mmol/L   CO2 25 18 - 29 mmol/L   Calcium 9.9 8.6 - 10.2 mg/dL   Total Protein 7.5 6.0 - 8.5 g/dL   Albumin 4.8 3.6 - 4.8 g/dL   Globulin, Total 2.7 1.5 - 4.5 g/dL   Albumin/Globulin Ratio 1.8 1.1 - 2.5   Bilirubin Total 0.6 0.0 - 1.2 mg/dL   Alkaline Phosphatase 69 39 - 117 IU/L   AST 64 (H) 0 - 40 IU/L   ALT 65 (H) 0 - 44 IU/L  Lipid Profile  Result Value Ref Range   Cholesterol, Total 226 (H) 100 - 199 mg/dL   Triglycerides 145 0 - 149 mg/dL   HDL 44 >39 mg/dL   VLDL Cholesterol Cal 29 5 - 40 mg/dL   LDL Calculated 153 (H) 0 - 99 mg/dL   Chol/HDL Ratio 5.1 (H) 0.0 - 5.0 ratio units  POCT HgB A1C  Result Value Ref Range   Hemoglobin A1C 6.7       Assessment & Plan:   Problem List Items Addressed This Visit      Cardiovascular and Mediastinum   BP (high blood pressure)   Relevant Medications   benazepril (LOTENSIN) 40 MG tablet     Digestive   Gastric reflux    Change to Zantac 2/2 cost.       Relevant Medications   ranitidine (ZANTAC) 150 MG capsule     Endocrine   DM (diabetes mellitus), type 2 with neurological complications (HCC) - Primary    A1c is not controlled. Due to cost will change to NPH insulin BID. Start at 12 units BID. Add glimepiride with close sugar monitoring. Continue metformin. Check CMP. Foot exam UTD.  Will hold off on urine micro-albumin due to cost concerns and pt is on ACE. Deferred eye exam at this time.       Relevant Medications   insulin NPH Human (HUMULIN N,NOVOLIN N) 100  UNIT/ML injection   glimepiride (AMARYL) 1 MG tablet   metFORMIN (GLUCOPHAGE) 1000 MG tablet   Insulin Syringe-Needle U-100 (INSULIN SYRINGE .5CC/30GX1/2") 30G X 1/2" 0.5 ML MISC   citalopram (CELEXA) 20 MG tablet   benazepril (LOTENSIN) 40 MG tablet   Other Relevant Orders   POCT HgB A1C   COMPLETE METABOLIC PANEL WITH GFR     Other   Anxiety and depression    Change  to celexa 2/2 cost.        Other Visit Diagnoses    Type 2 diabetes mellitus without complication, unspecified long term insulin use status (HCC)       Relevant Medications   insulin NPH Human (HUMULIN N,NOVOLIN N) 100 UNIT/ML injection   glimepiride (AMARYL) 1 MG tablet   metFORMIN (GLUCOPHAGE) 1000 MG tablet   benazepril (LOTENSIN) 40 MG tablet      Meds ordered this encounter  Medications  . insulin NPH Human (HUMULIN N,NOVOLIN N) 100 UNIT/ML injection    Sig: Inject 0.1 mLs (10 Units total) into the skin 2 (two) times daily before a meal.    Dispense:  10 mL    Refill:  11    Order Specific Question:   Supervising Provider    Answer:   Arlis Porta [phone]  . DISCONTD: lisinopril (PRINIVIL,ZESTRIL) 40 MG tablet    Sig: Take 1 tablet (40 mg total) by mouth daily.    Dispense:  90 tablet    Refill:  3    Order Specific Question:   Supervising Provider    Answer:   Arlis Porta [phone]  . glimepiride (AMARYL) 1 MG tablet    Sig: Take 1 tablet (1 mg total) by mouth daily with breakfast.    Dispense:  90 tablet    Refill:  3    Order Specific Question:   Supervising Provider    Answer:   Arlis Porta [phone]  . metFORMIN (GLUCOPHAGE) 1000 MG tablet    Sig: Take 1 tablet (1,000 mg total) by mouth 2 (two) times daily with a meal.    Dispense:  180 tablet    Refill:  3    Order Specific Question:   Supervising Provider    Answer:   Arlis Porta [phone]  . DISCONTD: benazepril (LOTENSIN) 40 MG tablet    Sig: Take 1 tablet (40 mg total) by mouth daily at 6 (six) AM.     Dispense:  90 tablet    Refill:  3    Order Specific Question:   Supervising Provider    Answer:   Arlis Porta [phone]  . DISCONTD: citalopram (CELEXA) 20 MG tablet    Sig: Take 1 tablet (20 mg total) by mouth daily.    Dispense:  90 tablet    Refill:  3    Order Specific Question:   Supervising Provider    Answer:   Arlis Porta [phone]  . DISCONTD: ranitidine (ZANTAC) 150 MG capsule    Sig: Take 1 capsule (150 mg total) by mouth 2 (two) times daily.    Dispense:  180 capsule    Refill:  3    Order Specific Question:   Supervising Provider    Answer:   Arlis Porta [phone]  . Insulin Syringe-Needle U-100 (INSULIN SYRINGE .5CC/30GX1/2") 30G X 1/2" 0.5 ML MISC    Sig: 1 each by Does not apply route 2 (two) times daily.    Dispense:  100 each    Refill:  5    Order Specific Question:   Supervising Provider    Answer:   Arlis Porta [phone]  . citalopram (CELEXA) 20 MG tablet    Sig: Take 1 tablet (20 mg total) by mouth daily.    Dispense:  90 tablet    Refill:  3    Order Specific Question:   Supervising Provider    Answer:  Arlis Porta [phone]  . ranitidine (ZANTAC) 150 MG capsule    Sig: Take 1 capsule (150 mg total) by mouth 2 (two) times daily.    Dispense:  180 capsule    Refill:  3    Order Specific Question:   Supervising Provider    Answer:   Arlis Porta [phone]  . benazepril (LOTENSIN) 40 MG tablet    Sig: Take 1 tablet (40 mg total) by mouth daily at 6 (six) AM.    Dispense:  90 tablet    Refill:  3    Order Specific Question:   Supervising Provider    Answer:   Arlis Porta [phone]      Follow up plan: Return in about 4 weeks (around 04/10/2016) for Sugar check. Marland Kitchen

## 2016-03-13 NOTE — Assessment & Plan Note (Signed)
Change to Zantac 2/2 cost.

## 2016-03-13 NOTE — Assessment & Plan Note (Signed)
A1c is not controlled. Due to cost will change to NPH insulin BID. Start at 12 units BID. Add glimepiride with close sugar monitoring. Continue metformin. Check CMP. Foot exam UTD.  Will hold off on urine micro-albumin due to cost concerns and pt is on ACE. Deferred eye exam at this time.

## 2016-03-13 NOTE — Assessment & Plan Note (Signed)
Change to celexa 2/2 cost.

## 2016-03-14 LAB — COMPLETE METABOLIC PANEL WITH GFR
ALBUMIN: 4.5 g/dL (ref 3.6–5.1)
ALK PHOS: 94 U/L (ref 40–115)
ALT: 63 U/L — AB (ref 9–46)
AST: 58 U/L — ABNORMAL HIGH (ref 10–35)
BILIRUBIN TOTAL: 0.9 mg/dL (ref 0.2–1.2)
BUN: 13 mg/dL (ref 7–25)
CO2: 24 mmol/L (ref 20–31)
CREATININE: 1.1 mg/dL (ref 0.70–1.25)
Calcium: 10.2 mg/dL (ref 8.6–10.3)
Chloride: 87 mmol/L — ABNORMAL LOW (ref 98–110)
GFR, EST AFRICAN AMERICAN: 83 mL/min (ref >=60)
GFR, EST NON AFRICAN AMERICAN: 72 mL/min (ref >=60)
Glucose, Bld: 411 mg/dL — ABNORMAL HIGH (ref 65–99)
Potassium: 5.1 mmol/L (ref 3.5–5.3)
Sodium: 128 mmol/L — ABNORMAL LOW (ref 135–146)
TOTAL PROTEIN: 8 g/dL (ref 6.1–8.1)

## 2016-04-12 ENCOUNTER — Other Ambulatory Visit: Payer: Self-pay | Admitting: *Deleted

## 2016-04-12 ENCOUNTER — Ambulatory Visit (INDEPENDENT_AMBULATORY_CARE_PROVIDER_SITE_OTHER): Payer: Self-pay | Admitting: Family Medicine

## 2016-04-12 ENCOUNTER — Encounter: Payer: Self-pay | Admitting: Family Medicine

## 2016-04-12 VITALS — BP 122/67 | HR 74 | Temp 98.6°F | Resp 16 | Ht 71.0 in | Wt 213.0 lb

## 2016-04-12 DIAGNOSIS — E119 Type 2 diabetes mellitus without complications: Secondary | ICD-10-CM

## 2016-04-12 DIAGNOSIS — Z23 Encounter for immunization: Secondary | ICD-10-CM

## 2016-04-12 DIAGNOSIS — I10 Essential (primary) hypertension: Secondary | ICD-10-CM

## 2016-04-12 DIAGNOSIS — E1149 Type 2 diabetes mellitus with other diabetic neurological complication: Secondary | ICD-10-CM

## 2016-04-12 MED ORDER — GLUCOSE BLOOD VI STRP: ORAL_STRIP | 12 refills | Status: DC

## 2016-04-12 MED ORDER — INSULIN NPH (HUMAN) (ISOPHANE) 100 UNIT/ML ~~LOC~~ SUSP: 10.0000 [IU] | Freq: Two times a day (BID) | SUBCUTANEOUS | 11 refills | Status: DC

## 2016-04-12 MED ORDER — METFORMIN HCL 1000 MG PO TABS: 1000.0000 mg | ORAL_TABLET | Freq: Two times a day (BID) | ORAL | 12 refills | Status: DC

## 2016-04-12 NOTE — Progress Notes (Signed)
Name: Richard ASBERRY Sr.   MRN: [id number]    DOB: 08/30/1955   Date:04/12/2016       Progress Note  Subjective  Chief Complaint  Chief Complaint  Patient presents with  . Diabetes    HPI Here for f/u of DM and HBP.  Off insurance and now getting meds at [facility] off $4 list.  On NPH insulin (18 units twice a daY).  BSs running 100-170.  He was inadvertantly stopped from his Metformin (did not get refilled).  No problem-specific Assessment & Plan notes found for this encounter.   Past Medical History:  Diagnosis Date  . Cholelithiasis   . Depression   . Diabetes mellitus without complication ([place])   . GERD (gastroesophageal reflux disease)   . Hyperlipidemia   . Hypertension     Past Surgical History:  Procedure Laterality Date  . Lung Collapse  60 years old.     Family History  Problem Relation Age of Onset  . Diabetes Father     complications of DM caused death  . Diabetes Sister   . Cancer Mother     Social History   Social History  . Marital status: Married    Spouse name: N/A  . Number of children: N/A  . Years of education: N/A   Occupational History  . Not on file.   Social History Main Topics  . Smoking status: Former Smoker    Quit date: 08/08/1999  . Smokeless tobacco: Never Used  . Alcohol use No     Comment: Has requested Antabuse at previous PCP visit.   . Drug use: No  . Sexual activity: Yes   Other Topics Concern  . Not on file   Social History Narrative  . No narrative on file     Current Outpatient Prescriptions:  .  aspirin 81 MG tablet, Take 1 tablet by mouth daily at 6 (six) AM., Disp: , Rfl:  .  benazepril (LOTENSIN) 40 MG tablet, Take 1 tablet (40 mg total) by mouth daily at 6 (six) AM., Disp: 90 tablet, Rfl: 3 .  citalopram (CELEXA) 20 MG tablet, Take 1 tablet (20 mg total) by mouth daily., Disp: 90 tablet, Rfl: 3 .  glimepiride (AMARYL) 1 MG tablet, Take 1 tablet (1 mg total) by mouth daily with breakfast., Disp: 90 tablet,  Rfl: 3 .  glucose blood (BAYER CONTOUR TEST) test strip, Use as instructed to check blood sugars twice a day., Disp: 100 each, Rfl: 12 .  insulin NPH Human (HUMULIN N,NOVOLIN N) 100 UNIT/ML injection, Inject 0.1 mLs (10 Units total) into the skin 2 (two) times daily before a meal., Disp: 10 mL, Rfl: 11 .  Insulin Syringe-Needle U-100 (INSULIN SYRINGE .5CC/30GX1/2") 30G X 1/2" 0.5 ML MISC, 1 each by Does not apply route 2 (two) times daily., Disp: 100 each, Rfl: 5 .  Magnesium Gluconate 500 (27 MG) MG TABS, Take by mouth., Disp: , Rfl:  .  MULTIPLE VITAMIN PO, Take by mouth., Disp: , Rfl:  .  tamsulosin (FLOMAX) 0.4 MG CAPS capsule, Take by mouth., Disp: , Rfl:  .  metFORMIN (GLUCOPHAGE) 1000 MG tablet, Take 1 tablet (1,000 mg total) by mouth 2 (two) times daily with a meal., Disp: 180 tablet, Rfl: 12 .  ranitidine (ZANTAC) 150 MG capsule, Take 1 capsule (150 mg total) by mouth 2 (two) times daily. (Patient not taking: Reported on 04/12/2016), Disp: 180 capsule, Rfl: 3  Allergies  Allergen Reactions  . Bee Venom Swelling  Itching      Review of Systems  Constitutional: Negative for chills, fever, malaise/fatigue and weight loss.  HENT: Negative for hearing loss.   Eyes: Negative for blurred vision and double vision.  Respiratory: Negative for cough, shortness of breath and wheezing.   Cardiovascular: Negative for chest pain, palpitations and leg swelling.  Gastrointestinal: Negative for abdominal pain, blood in stool and heartburn.  Genitourinary: Negative for dysuria and urgency.  Musculoskeletal: Negative for myalgias and neck pain.  Skin: Negative for rash.  Neurological: Negative for dizziness, tremors, weakness and headaches.      Objective  Vitals:   04/12/16 0915  BP: 122/67  Pulse: 74  Resp: 16  Temp: 98.6 F (37 C)  TempSrc: Oral  Weight: 213 lb (96.6 kg)  Height: 5\' 11"  (1.803 m)    Physical Exam  Constitutional: He is oriented to person, place, and time and  well-developed, well-nourished, and in no distress. No distress.  HENT:  Head: Normocephalic and atraumatic.  Eyes: Conjunctivae and EOM are normal. Pupils are equal, round, and reactive to light. No scleral icterus.  Neck: Normal range of motion. Neck supple. Carotid bruit is not present. No thyromegaly present.  Cardiovascular: Normal rate, regular rhythm and normal heart sounds.  Exam reveals no gallop and no friction rub.   No murmur heard. Pulmonary/Chest: Effort normal and breath sounds normal. No respiratory distress. He has no wheezes. He has no rales.  Musculoskeletal: He exhibits no edema.  Lymphadenopathy:    He has no cervical adenopathy.  Neurological: He is alert and oriented to person, place, and time.  Vitals reviewed.      Recent Results (from the past 2160 hour(s))  COMPLETE METABOLIC PANEL WITH GFR     Status: Abnormal   Collection Time: 03/13/16  3:35 PM  Result Value Ref Range   Sodium 128 (L) 135 - 146 mmol/L   Potassium 5.1 3.5 - 5.3 mmol/L   Chloride 87 (L) 98 - 110 mmol/L   CO2 24 20 - 31 mmol/L   Glucose, Bld 411 (H) 65 - 99 mg/dL    Comment: Result repeated and verified.   BUN 13 7 - 25 mg/dL   Creat 1.10 0.70 - 1.25 mg/dL    Comment:   For patients > or = 61 years of age: The upper reference limit for Creatinine is approximately 13% higher for people identified as African-American.      Total Bilirubin 0.9 0.2 - 1.2 mg/dL   Alkaline Phosphatase 94 40 - 115 U/L   AST 58 (H) 10 - 35 U/L   ALT 63 (H) 9 - 46 U/L   Total Protein 8.0 6.1 - 8.1 g/dL   Albumin 4.5 3.6 - 5.1 g/dL   Calcium 10.2 8.6 - 10.3 mg/dL   GFR, Est African American 83 >=60 mL/min   GFR, Est Non African American 72 >=60 mL/min  POCT HgB A1C     Status: Normal   Collection Time: 03/13/16  4:17 PM  Result Value Ref Range   Hemoglobin A1C 11.2      Assessment & Plan  Problem List Items Addressed This Visit      Cardiovascular and Mediastinum   Essential (primary)  hypertension - Primary     Endocrine   DM (diabetes mellitus), type 2 with neurological complications (HCC)   Relevant Medications   metFORMIN (GLUCOPHAGE) 1000 MG tablet   insulin NPH Human (HUMULIN N,NOVOLIN N) 100 UNIT/ML injection    Other Visit Diagnoses  Type 2 diabetes mellitus without complication, unspecified long term insulin use status (HCC)       Relevant Medications   glucose blood (BAYER CONTOUR TEST) test strip   metFORMIN (GLUCOPHAGE) 1000 MG tablet   insulin NPH Human (HUMULIN N,NOVOLIN N) 100 UNIT/ML injection      Meds ordered this encounter  Medications  . glucose blood (BAYER CONTOUR TEST) test strip    Sig: Use as instructed to check blood sugars twice a day.    Dispense:  100 each    Refill:  12  . metFORMIN (GLUCOPHAGE) 1000 MG tablet    Sig: Take 1 tablet (1,000 mg total) by mouth 2 (two) times daily with a meal.    Dispense:  180 tablet    Refill:  12  . insulin NPH Human (HUMULIN N,NOVOLIN N) 100 UNIT/ML injection    Sig: Inject 0.1 mLs (10 Units total) into the skin 2 (two) times daily before a meal.    Dispense:  10 mL    Refill:  11   1. Type 2 diabetes mellitus without complication, unspecified long term insulin use status (HCC)  - glucose blood (BAYER CONTOUR TEST) test strip; Use as instructed to check blood sugars twice a day.  Dispense: 100 each; Refill: 12  2. DM (diabetes mellitus), type 2 with neurological complications (HCC)  - metFORMIN (GLUCOPHAGE) 1000 MG tablet; Take 1 tablet (1,000 mg total) by mouth 2 (two) times daily with a meal.  Dispense: 180 tablet; Refill: 12 - insulin NPH Human (HUMULIN N,NOVOLIN N) 100 UNIT/ML injection; Inject 0.1 mLs (10 Units total) into the skin 2 (two) times daily before a meal.  Dispense: 10 mL; Refill: 11  (decreased from 18 units twice a day).  3. Essential (primary) hypertension Cont meds

## 2016-04-25 ENCOUNTER — Telehealth: Payer: Self-pay | Admitting: *Deleted

## 2016-04-25 NOTE — Telephone Encounter (Signed)
Dr. [name] has reviewed blood sugars. He wants patient to continue current medications and he will check A1C next office visit.

## 2016-07-19 ENCOUNTER — Ambulatory Visit: Payer: BLUE CROSS/BLUE SHIELD | Admitting: Family Medicine

## 2016-10-15 ENCOUNTER — Telehealth: Payer: Self-pay | Admitting: Family Medicine

## 2016-10-15 DIAGNOSIS — K219 Gastro-esophageal reflux disease without esophagitis: Secondary | ICD-10-CM

## 2016-10-15 MED ORDER — FAMOTIDINE 20 MG PO TABS: 20.0000 mg | ORAL_TABLET | Freq: Two times a day (BID) | ORAL | 0 refills | Status: DC

## 2016-10-15 NOTE — Telephone Encounter (Signed)
Patient aware.

## 2016-10-15 NOTE — Telephone Encounter (Signed)
Prescription for famotidine 20 mg every 12 hours sent to [facility] in [place].  However, not recommended for long term use greater than 12 weeks at this dosing.  Please schedule a follow-up visit for GERD and Diabetes (last A1C was 11.2) before any additional refills.

## 2016-10-15 NOTE — Telephone Encounter (Signed)
Called patient ranitidine is $150 but Pepcid is in [facility] 4 $ list can we send this Rx ?

## 2016-10-15 NOTE — Telephone Encounter (Signed)
Ranitidine is no longer on the $4 list at [facility] so pt asked for a prescription for  pepcid to be sent to [place].  Amy had given him a prescription before but he never had it filled.  His call back number is [phone]

## 2016-10-16 ENCOUNTER — Other Ambulatory Visit: Payer: Self-pay | Admitting: Nurse Practitioner

## 2016-10-16 DIAGNOSIS — K219 Gastro-esophageal reflux disease without esophagitis: Secondary | ICD-10-CM

## 2017-01-21 ENCOUNTER — Ambulatory Visit (INDEPENDENT_AMBULATORY_CARE_PROVIDER_SITE_OTHER): Payer: BLUE CROSS/BLUE SHIELD | Admitting: Family Medicine

## 2017-01-21 ENCOUNTER — Encounter: Payer: Self-pay | Admitting: Family Medicine

## 2017-01-21 ENCOUNTER — Other Ambulatory Visit: Payer: Self-pay | Admitting: Family Medicine

## 2017-01-21 VITALS — BP 130/80 | HR 82 | Temp 98.5°F | Resp 16 | Ht 71.0 in | Wt 211.6 lb

## 2017-01-21 DIAGNOSIS — K219 Gastro-esophageal reflux disease without esophagitis: Secondary | ICD-10-CM

## 2017-01-21 DIAGNOSIS — E1149 Type 2 diabetes mellitus with other diabetic neurological complication: Secondary | ICD-10-CM | POA: Diagnosis not present

## 2017-01-21 DIAGNOSIS — F418 Other specified anxiety disorders: Secondary | ICD-10-CM

## 2017-01-21 DIAGNOSIS — F1021 Alcohol dependence, in remission: Secondary | ICD-10-CM | POA: Diagnosis not present

## 2017-01-21 DIAGNOSIS — Z8719 Personal history of other diseases of the digestive system: Secondary | ICD-10-CM

## 2017-01-21 DIAGNOSIS — E785 Hyperlipidemia, unspecified: Secondary | ICD-10-CM

## 2017-01-21 DIAGNOSIS — D400 Neoplasm of uncertain behavior of prostate: Secondary | ICD-10-CM

## 2017-01-21 DIAGNOSIS — Z1211 Encounter for screening for malignant neoplasm of colon: Secondary | ICD-10-CM

## 2017-01-21 DIAGNOSIS — I10 Essential (primary) hypertension: Secondary | ICD-10-CM | POA: Diagnosis not present

## 2017-01-21 DIAGNOSIS — Z0000 Encounter for general adult medical examination without abnormal findings: Secondary | ICD-10-CM

## 2017-01-21 DIAGNOSIS — E1169 Type 2 diabetes mellitus with other specified complication: Secondary | ICD-10-CM

## 2017-01-21 LAB — POCT GLYCOSYLATED HEMOGLOBIN (HGB A1C): Hemoglobin A1C: 6.8 — AB (ref ?–5.7)

## 2017-01-21 MED ORDER — METFORMIN HCL 1000 MG PO TABS: 1000.0000 mg | ORAL_TABLET | Freq: Two times a day (BID) | ORAL | 3 refills | Status: DC

## 2017-01-21 MED ORDER — GLIMEPIRIDE 1 MG PO TABS: 1.0000 mg | ORAL_TABLET | Freq: Every day | ORAL | 3 refills | Status: DC

## 2017-01-21 MED ORDER — FAMOTIDINE 20 MG PO TABS: 20.0000 mg | ORAL_TABLET | Freq: Two times a day (BID) | ORAL | 3 refills | Status: DC

## 2017-01-21 MED ORDER — BENAZEPRIL HCL 40 MG PO TABS: 40.0000 mg | ORAL_TABLET | Freq: Every day | ORAL | 3 refills | Status: DC

## 2017-01-21 MED ORDER — LISINOPRIL 40 MG PO TABS: 40.0000 mg | ORAL_TABLET | Freq: Every day | ORAL | 3 refills | Status: DC

## 2017-01-21 MED ORDER — CITALOPRAM HYDROBROMIDE 20 MG PO TABS: 20.0000 mg | ORAL_TABLET | Freq: Every day | ORAL | 3 refills | Status: DC

## 2017-01-21 MED ORDER — INSULIN NPH (HUMAN) (ISOPHANE) 100 UNIT/ML ~~LOC~~ SUSP: 10.0000 [IU] | Freq: Two times a day (BID) | SUBCUTANEOUS | 3 refills | Status: DC

## 2017-01-21 NOTE — Assessment & Plan Note (Signed)
Stable without significant symptoms, only occasional nausea, without RUQ pain or colic Previously established with Gen Surg Dr Jamal Collin ([place]), prior imaging to confirm but no surgery Reassurance, advised return criteria when to follow-up

## 2017-01-21 NOTE — Assessment & Plan Note (Signed)
Well-controlled DM with A1c 6.8 (much improved from prior >11 with not on meds due to ins and cost) Complications - peripheral neuropathy  Plan:  1. Continue current therapy - Insulin NPH 10u BID, Metformin 1000mg  BID, Glimepiride 1mg  daily wc - refilled all today 90 day +3 refills 2. Encourage improved lifestyle - low carb, low sugar diet, reduce portion size, continue improving regular exercise 3. Check CBG, bring log to next visit for review 4. Continue ASA, ACEi - will review indication for statin therapy at future visit 5. DM Foot exam done today / Advised to schedule DM ophtho exam, send record 6. Follow-up 3 months labs annual phys

## 2017-01-21 NOTE — Assessment & Plan Note (Signed)
Due for routine colon cancer screening. Last colonoscopy (2008) normal, good for 10 years, no family history colon cancer. - Discussion today about recommendations for either Colonoscopy or Cologuard screening, benefits and risks of screening, interested in Cologuard, understands that if positive then recommendation is for diagnostic colonoscopy to follow-up.  - Patient advised to contact insurance first to learn cost, will notify us when ready for [place] to order Cologuard

## 2017-01-21 NOTE — Progress Notes (Signed)
Subjective:    Patient ID: Richard Columbia Sr., male    DOB: 1956-06-12, 61 y.o.   MRN: [id number]  Richard DAHER Sr. is a 61 y.o. male presenting on 01/21/2017 for Diabetes (highest 120 and lowest 96)   HPI   Specialist: Urology - Dr Edrick Oh [facility]) - on allopurinol for uric acid kidney stones, and sildenafil for ED.   Previously out of medications for 2-3 months, then back on insurance [employer]  CHRONIC DM, Type 2 with DM Neuropathy: Reports prior concerns with lack of ins out of meds for 2-3 months, in past had uncontrolled A1c up to >11. Now pleased with improved result back on medicine. CBGs: does not have cbg log Checks CBGs 1-3x daily Meds: Insulin NPH 10 unit twice daily, Metformin 1000mg  BID, Glimepiride 1mg  daily Reports good compliance. Tolerating well w/o side-effects Currently on ACEi Lifestyle: - Diet (balanced diet, tries to follow low carb DM diet)  - Exercise (regular exercise) No confirmed nerve damage but some clinical neuropathy, gradual worsening, both sides of feet, mostly L great toe, mild loss of sensitivity Taking Vitamin B12 Magnesium, MVI, Melatonin. Denies hypoglycemia, polyuria, visual changes, numbness.  CHRONIC HTN: Reports he is concerned with elevated BP today, otherwise in past has been controlled on meds. He does not check it routinely at home but can. Current Meds - Benazepril 40mg  - uncertain if other BP med, thought Lisinopril 40mg  Reports good compliance, took med today. Tolerating well, w/o complaints. Lifestyle: Denies CP, dyspnea, HA, edema, dizziness / lightheadedness  GERD: - Prior history EGD in 2008, reported no problem. Managed on Famotidine 20mg  BID regularly for GERD, well controlled, request refill today  Anxiety, without panic / History of Depression since resolved - Reviews prior history with mostly anxiety and "worrying" without clear diagnosis, states prior had issue with depression only related to certain  acute life stressors, since resolved. - Currently admits feeling well without significant mental health concerns. He is stable with regards to anxiety, taking Celexa 20mg  daily, request refill, has not run out of this med now.  Additional history (not focus of visit) - Cholelithiasis - Reports prior dx with RUQ [place] after chronic recurrent nausea episodes, saw gallstones, sent to Gen Surgery, Dr Jamal Collin, advised no indication for surgery at this time, watchful waiting, he currently has occasional minimal symptoms but no pain or other issues. He takes OTC meds nauseeze or dramamine PRN with relief, rarely - History of alcohol abuse and fatty liver disease, quit drinking years ago.  Health Maintenance: - Colon CA Screening: Last test done in 2008 approx with colonoscopy (also had EGD) was told colonoscopy negative without polyps or concern. No known family history of colon CA. Now it has been 10 years since last test, due for next. He is not interested in repeating colonoscopy, and would like to consider non-invasive cologuard testing   No prior PHQ/GAD7  No flowsheet data found.  No flowsheet data found.   Social History  Substance Use Topics  . Smoking status: Former Smoker    Quit date: 08/08/1999  . Smokeless tobacco: Never Used  . Alcohol use No     Comment: Has requested Antabuse at previous PCP visit.     Review of Systems Per HPI unless specifically indicated above     Objective:    BP 130/80 (BP Location: Left Arm, Cuff Size: Normal)   Pulse 82   Temp 98.5 F (36.9 C) (Oral)   Resp 16  Ht 5\' 11"  (1.803 m)   Wt 211 lb 9.6 oz (96 kg)   BMI 29.51 kg/m   Wt Readings from Last 3 Encounters:  01/21/17 211 lb 9.6 oz (96 kg)  04/12/16 213 lb (96.6 kg)  03/13/16 207 lb (93.9 kg)    Physical Exam  Constitutional: He is oriented to person, place, and time. He appears well-developed and well-nourished. No distress.  Well-appearing, comfortable, cooperative  HENT:  Head:  Normocephalic and atraumatic.  Mouth/Throat: Oropharynx is clear and moist.  Eyes: Conjunctivae are normal. Right eye exhibits no discharge. Left eye exhibits no discharge.  Neck: Normal range of motion.  Cardiovascular: Normal rate, regular rhythm, normal heart sounds and intact distal pulses.   No murmur heard. Pulmonary/Chest: Effort normal.  Musculoskeletal: Normal range of motion. He exhibits no edema.  Neurological: He is alert and oriented to person, place, and time.  Skin: Skin is warm and dry. No rash noted. He is not diaphoretic. No erythema.  Psychiatric: He has a normal mood and affect. His behavior is normal.  Well groomed, good eye contact, normal speech and thoughts  Nursing note and vitals reviewed.    Diabetic Foot Exam - Simple   Simple Foot Form Diabetic Foot exam was performed with the following findings:  Yes 01/21/2017  4:34 PM  Visual Inspection No deformities, no ulcerations, no other skin breakdown bilaterally:  Yes Sensation Testing Intact to touch and monofilament testing bilaterally:  Yes Pulse Check Posterior Tibialis and Dorsalis pulse intact bilaterally:  Yes Comments       Recent Labs  03/13/16 1617 01/21/17 1622  HGBA1C 11.2 6.8*    Results for orders placed or performed in visit on 01/21/17  POCT HgB A1C  Result Value Ref Range   Hemoglobin A1C 6.8 (A) 5.7      Assessment & Plan:   Problem List Items Addressed This Visit    Screening for colon cancer    Due for routine colon cancer screening. Last colonoscopy (2008) normal, good for 10 years, no family history colon cancer. - Discussion today about recommendations for either Colonoscopy or Cologuard screening, benefits and risks of screening, interested in Cologuard, understands that if positive then recommendation is for diagnostic colonoscopy to follow-up.  - Patient advised to contact insurance first to learn cost, will notify us when ready for [place] to order Cologuard      History  of cholelithiasis    Stable without significant symptoms, only occasional nausea, without RUQ pain or colic Previously established with Gen Surg Dr Jamal Collin ([place]), prior imaging to confirm but no surgery Reassurance, advised return criteria when to follow-up      RESOLVED: History of alcoholism ([place])   GERD (gastroesophageal reflux disease)    Stable, controlled on Famotidine 20mg  BID, refilled today Prior EGD 2008 negative      Relevant Medications   famotidine (PEPCID) 20 MG tablet   Essential hypertension    Mildly elevated initial BP, repeat manual check improved to SBP 130s. - Home BP readings none available  No known complications    Plan:  1. Continue current BP regimen - Benazepril 40mg  daily (refilled 90 day +3 refills), also incorrectly sent Lisinopril initially on patient request, and then realized he had been on 2 different ACEi, do not suspect he was on these both at same time, called patient after OV to review this and advised NOT to fill Lisinopril, only Benazepril, he does not have any other anti-HTN meds at home or  on chart review 2. Encourage improved lifestyle - low sodium diet, regular exercise 3. Start monitor BP outside office, bring readings to next visit, if persistently >140/90 or new symptoms notify office sooner 4. Follow-up 3 months annual phys + labs - if BP uncontrolled consider add CCB vs thiazide as next option      Relevant Medications   sildenafil (REVATIO) 20 MG tablet   benazepril (LOTENSIN) 40 MG tablet   DM (diabetes mellitus), type 2 with neurological complications ([place]) - Primary    Well-controlled DM with A1c 6.8 (much improved from prior >11 with not on meds due to ins and cost) Complications - peripheral neuropathy  Plan:  1. Continue current therapy - Insulin NPH 10u BID, Metformin 1000mg  BID, Glimepiride 1mg  daily wc - refilled all today 90 day +3 refills 2. Encourage improved lifestyle - low carb, low sugar diet, reduce  portion size, continue improving regular exercise 3. Check CBG, bring log to next visit for review 4. Continue ASA, ACEi - will review indication for statin therapy at future visit 5. DM Foot exam done today / Advised to schedule DM ophtho exam, send record 6. Follow-up 3 months labs annual phys      Relevant Medications   insulin NPH Human (HUMULIN N,NOVOLIN N) 100 UNIT/ML injection   metFORMIN (GLUCOPHAGE) 1000 MG tablet   benazepril (LOTENSIN) 40 MG tablet   glimepiride (AMARYL) 1 MG tablet   Other Relevant Orders   POCT HgB A1C (Completed)   Anxiety disorder, unspecified    Stable, controlled chronic anxiety without panic or comorbid depression Prior history suggestive of adjustment disorder Not established with psychiatry / therapy  Plan: 1. Refilled Celexa 20mg  daily 2. Follow-up PRN - will check GAD/PHQ at annual physical      Relevant Medications   citalopram (CELEXA) 20 MG tablet      Meds ordered this encounter  Medications  . sildenafil (REVATIO) 20 MG tablet    Sig: Take 3-5 tablets daily by mouth as needed  . allopurinol (ZYLOPRIM) 300 MG tablet    Sig: Take 300 mg by mouth.  . vitamin B-12 (CYANOCOBALAMIN) 1000 MCG tablet    Sig: Take by mouth.  . insulin NPH Human (HUMULIN N,NOVOLIN N) 100 UNIT/ML injection    Sig: Inject 0.1 mLs (10 Units total) into the skin 2 (two) times daily before a meal.    Dispense:  30 mL    Refill:  3  . metFORMIN (GLUCOPHAGE) 1000 MG tablet    Sig: Take 1 tablet (1,000 mg total) by mouth 2 (two) times daily with a meal.    Dispense:  180 tablet    Refill:  3  . benazepril (LOTENSIN) 40 MG tablet    Sig: Take 1 tablet (40 mg total) by mouth daily at 6 (six) AM.    Dispense:  90 tablet    Refill:  3  . citalopram (CELEXA) 20 MG tablet    Sig: Take 1 tablet (20 mg total) by mouth daily.    Dispense:  90 tablet    Refill:  3  . DISCONTD: lisinopril (PRINIVIL,ZESTRIL) 40 MG tablet    Sig: Take 1 tablet (40 mg total) by mouth  daily.    Dispense:  90 tablet    Refill:  3  . famotidine (PEPCID) 20 MG tablet    Sig: Take 1 tablet (20 mg total) by mouth 2 (two) times daily.    Dispense:  180 tablet    Refill:  3  .  glimepiride (AMARYL) 1 MG tablet    Sig: Take 1 tablet (1 mg total) by mouth daily with breakfast.    Dispense:  90 tablet    Refill:  3    Follow up plan: Return in about 3 months (around 04/23/2017) for Annual Physical.  Nobie Putnam, DO [facility] 01/21/2017, 6:20 PM

## 2017-01-21 NOTE — Assessment & Plan Note (Signed)
Mildly elevated initial BP, repeat manual check improved to SBP 130s. - Home BP readings none available  No known complications    Plan:  1. Continue current BP regimen - Benazepril 40mg  daily (refilled 90 day +3 refills), also incorrectly sent Lisinopril initially on patient request, and then realized he had been on 2 different ACEi, do not suspect he was on these both at same time, called patient after OV to review this and advised NOT to fill Lisinopril, only Benazepril, he does not have any other anti-HTN meds at home or on chart review 2. Encourage improved lifestyle - low sodium diet, regular exercise 3. Start monitor BP outside office, bring readings to next visit, if persistently >140/90 or new symptoms notify office sooner 4. Follow-up 3 months annual phys + labs - if BP uncontrolled consider add CCB vs thiazide as next option

## 2017-01-21 NOTE — Assessment & Plan Note (Signed)
Stable, controlled chronic anxiety without panic or comorbid depression Prior history suggestive of adjustment disorder Not established with psychiatry / therapy  Plan: 1. Refilled Celexa 20mg  daily 2. Follow-up PRN - will check GAD/PHQ at annual physical

## 2017-01-21 NOTE — Patient Instructions (Addendum)
Thank you for coming to the clinic today.  1. Refilled all meds to [facility], 90 day supply, with refills  Recommend scheduling with Eye Doctor for annual DM eye exam - have them fax [place] a copy of the result  Colon Cancer Screening: - For all adults age 62+ routine colon cancer screening is highly recommended.     - Recent guidelines from [place] recommend starting age of 61 - Early detection of colon cancer is important, because often there are no warning signs or symptoms, also if found early usually it can be cured. Late stage is hard to treat.  - If you are not interested in Colonoscopy screening (if done and normal you could be cleared for 5 to 10 years until next due), then Cologuard is an excellent alternative for screening test for Colon Cancer. It is highly sensitive for detecting DNA of colon cancer from even the earliest stages. Also, there is NO bowel prep required. - If Cologuard is NEGATIVE, then it is good for 3 years before next due - If Cologuard is POSITIVE, then it is strongly advised to get a Colonoscopy, which allows the GI doctor to locate the source of the cancer or polyp (even very early stage) and treat it by removing it. ------------------------- If you would like to proceed with Cologuard (stool DNA test) - FIRST, call your insurance company and tell them you want to check cost of Cologuard tell them CPT Code [phone] (it may be completely covered and you could get for no cost, OR max cost without any coverage is about $600). Also, keep in mind if you do NOT open the kit, and decide not to do the test, you will NOT be charged, you should contact the company if you decide not to do the test. - If you want to proceed, you can notify us (phone message, [place], or at next visit) and we will order it for you. The test kit will be delivered to you house within about 1 week. Follow instructions to collect sample, you may call the company for any help  or questions, 24/7 telephone support at [phone].  You will be due for FASTING BLOOD WORK (no food or drink after midnight before, only water or coffee without cream/sugar on the morning of)  - Please go ahead and schedule a "Lab Only" visit in the morning at the clinic for lab draw in 3 months  - before next Annual Physical - Make sure Lab Only appointment is at least 1-2 weeks before your next appointment, so that results will be available  For Lab Results, once available within 2-3 days of blood draw, you can can log in to MyChart online to view your results and a brief explanation. Also, we can discuss results at next follow-up visit.   Please schedule a Follow-up Appointment to: Return in about 3 months (around 04/23/2017) for Annual Physical.  If you have any other questions or concerns, please feel free to call the clinic or send a message through [place]. You may also schedule an earlier appointment if necessary.  Additionally, you may be receiving a survey about your experience at our clinic within a few days to 1 week by e-mail or mail. We value your feedback.  Nobie Putnam, DO [facility]

## 2017-01-21 NOTE — Assessment & Plan Note (Signed)
Stable, controlled on Famotidine 20mg  BID, refilled today Prior EGD 2008 negative

## 2017-04-30 ENCOUNTER — Other Ambulatory Visit: Payer: Self-pay

## 2017-04-30 DIAGNOSIS — E785 Hyperlipidemia, unspecified: Secondary | ICD-10-CM

## 2017-04-30 DIAGNOSIS — D400 Neoplasm of uncertain behavior of prostate: Secondary | ICD-10-CM

## 2017-04-30 DIAGNOSIS — E1169 Type 2 diabetes mellitus with other specified complication: Secondary | ICD-10-CM

## 2017-04-30 DIAGNOSIS — R7989 Other specified abnormal findings of blood chemistry: Secondary | ICD-10-CM

## 2017-04-30 DIAGNOSIS — E1149 Type 2 diabetes mellitus with other diabetic neurological complication: Secondary | ICD-10-CM

## 2017-04-30 DIAGNOSIS — R945 Abnormal results of liver function studies: Secondary | ICD-10-CM

## 2017-04-30 DIAGNOSIS — Z0000 Encounter for general adult medical examination without abnormal findings: Secondary | ICD-10-CM

## 2017-04-30 DIAGNOSIS — I10 Essential (primary) hypertension: Secondary | ICD-10-CM

## 2017-05-01 DIAGNOSIS — R945 Abnormal results of liver function studies: Secondary | ICD-10-CM

## 2017-05-01 DIAGNOSIS — R7989 Other specified abnormal findings of blood chemistry: Secondary | ICD-10-CM | POA: Insufficient documentation

## 2017-05-01 LAB — COMPLETE METABOLIC PANEL WITH GFR
AG Ratio: 1.2 (calc) (ref 1.0–2.5)
ALKALINE PHOSPHATASE (APISO): 74 U/L (ref 40–115)
ALT: 73 U/L — ABNORMAL HIGH (ref 9–46)
AST: 61 U/L — AB (ref 10–35)
Albumin: 4 g/dL (ref 3.6–5.1)
BUN: 12 mg/dL (ref 7–25)
CALCIUM: 9.6 mg/dL (ref 8.6–10.3)
CO2: 25 mmol/L (ref 20–32)
CREATININE: 0.96 mg/dL (ref 0.70–1.25)
Chloride: 100 mmol/L (ref 98–110)
GFR, EST NON AFRICAN AMERICAN: 84 mL/min/{1.73_m2} (ref >=60)
GFR, Est African American: 98 mL/min/{1.73_m2} (ref >=60)
GLOBULIN: 3.3 g/dL (ref 1.9–3.7)
GLUCOSE: 150 mg/dL — AB (ref 65–99)
Potassium: 4.7 mmol/L (ref 3.5–5.3)
Sodium: 136 mmol/L (ref 135–146)
Total Bilirubin: 0.5 mg/dL (ref 0.2–1.2)
Total Protein: 7.3 g/dL (ref 6.1–8.1)

## 2017-05-01 LAB — CBC WITH DIFFERENTIAL/PLATELET
Basophils Absolute: 89 cells/uL (ref 0–200)
Basophils Relative: 1.9 %
EOS PCT: 8 %
Eosinophils Absolute: 376 cells/uL (ref 15–500)
HEMATOCRIT: 42.2 % (ref 38.5–50.0)
Hemoglobin: 15 g/dL (ref 13.2–17.1)
Lymphs Abs: 1283 cells/uL (ref 850–3900)
MCH: 35.1 pg — ABNORMAL HIGH (ref 27.0–33.0)
MCHC: 35.5 g/dL (ref 32.0–36.0)
MCV: 98.8 fL (ref 80.0–100.0)
MONOS PCT: 16.3 %
MPV: 10.5 fL (ref 7.5–12.5)
NEUTROS PCT: 46.5 %
Neutro Abs: 2186 cells/uL (ref 1500–7800)
PLATELETS: 158 10*3/uL (ref 140–400)
RBC: 4.27 10*6/uL (ref 4.20–5.80)
RDW: 13.1 % (ref 11.0–15.0)
TOTAL LYMPHOCYTE: 27.3 %
WBC: 4.7 10*3/uL (ref 3.8–10.8)
WBCMIX: 766 {cells}/uL (ref 200–950)

## 2017-05-01 LAB — LIPID PANEL
CHOL/HDL RATIO: 5.9 (calc) — AB (ref <5.0)
Cholesterol: 236 mg/dL — ABNORMAL HIGH (ref <200)
HDL: 40 mg/dL — AB (ref >40)
LDL CHOLESTEROL (CALC): 150 mg/dL — AB
NON-HDL CHOLESTEROL (CALC): 196 mg/dL — AB (ref <130)
TRIGLYCERIDES: 300 mg/dL — AB (ref <150)

## 2017-05-01 LAB — HEMOGLOBIN A1C
EAG (MMOL/L): 7.4 (calc)
HEMOGLOBIN A1C: 6.3 %{Hb} — AB (ref <5.7)
MEAN PLASMA GLUCOSE: 134 (calc)

## 2017-05-06 ENCOUNTER — Ambulatory Visit (INDEPENDENT_AMBULATORY_CARE_PROVIDER_SITE_OTHER): Payer: BLUE CROSS/BLUE SHIELD | Admitting: Family Medicine

## 2017-05-06 ENCOUNTER — Encounter: Payer: Self-pay | Admitting: Family Medicine

## 2017-05-06 VITALS — BP 134/78 | HR 75 | Temp 98.6°F | Resp 16 | Ht 71.0 in | Wt 215.0 lb

## 2017-05-06 DIAGNOSIS — Z1211 Encounter for screening for malignant neoplasm of colon: Secondary | ICD-10-CM

## 2017-05-06 DIAGNOSIS — F418 Other specified anxiety disorders: Secondary | ICD-10-CM | POA: Diagnosis not present

## 2017-05-06 DIAGNOSIS — I10 Essential (primary) hypertension: Secondary | ICD-10-CM

## 2017-05-06 DIAGNOSIS — K219 Gastro-esophageal reflux disease without esophagitis: Secondary | ICD-10-CM

## 2017-05-06 DIAGNOSIS — F1020 Alcohol dependence, uncomplicated: Secondary | ICD-10-CM | POA: Insufficient documentation

## 2017-05-06 DIAGNOSIS — R7989 Other specified abnormal findings of blood chemistry: Secondary | ICD-10-CM

## 2017-05-06 DIAGNOSIS — R945 Abnormal results of liver function studies: Secondary | ICD-10-CM | POA: Diagnosis not present

## 2017-05-06 DIAGNOSIS — E785 Hyperlipidemia, unspecified: Secondary | ICD-10-CM | POA: Diagnosis not present

## 2017-05-06 DIAGNOSIS — E1169 Type 2 diabetes mellitus with other specified complication: Secondary | ICD-10-CM | POA: Diagnosis not present

## 2017-05-06 DIAGNOSIS — Z23 Encounter for immunization: Secondary | ICD-10-CM

## 2017-05-06 DIAGNOSIS — Z0000 Encounter for general adult medical examination without abnormal findings: Secondary | ICD-10-CM

## 2017-05-06 DIAGNOSIS — F1021 Alcohol dependence, in remission: Secondary | ICD-10-CM

## 2017-05-06 DIAGNOSIS — E1149 Type 2 diabetes mellitus with other diabetic neurological complication: Secondary | ICD-10-CM | POA: Diagnosis not present

## 2017-05-06 MED ORDER — CITALOPRAM HYDROBROMIDE 20 MG PO TABS: 20.0000 mg | ORAL_TABLET | Freq: Every day | ORAL | 3 refills | Status: DC

## 2017-05-06 MED ORDER — ROSUVASTATIN CALCIUM 10 MG PO TABS: 10.0000 mg | ORAL_TABLET | Freq: Every day | ORAL | 3 refills | Status: DC

## 2017-05-06 MED ORDER — METFORMIN HCL 1000 MG PO TABS: 1000.0000 mg | ORAL_TABLET | Freq: Two times a day (BID) | ORAL | 3 refills | Status: DC

## 2017-05-06 MED ORDER — BENAZEPRIL HCL 40 MG PO TABS: 40.0000 mg | ORAL_TABLET | Freq: Every day | ORAL | 3 refills | Status: DC

## 2017-05-06 MED ORDER — FAMOTIDINE 20 MG PO TABS: 20.0000 mg | ORAL_TABLET | Freq: Two times a day (BID) | ORAL | 3 refills | Status: DC

## 2017-05-06 NOTE — Progress Notes (Signed)
Subjective:    Patient ID: Richard Columbia Sr., male    DOB: Nov 20, 1955, 61 y.o.   MRN: [id number]  Richard SUDOL Sr. is a 61 y.o. male presenting on 05/06/2017 for Annual Exam   HPI   Here for Annual Physical and Lab Review  Specialist: Urology - Dr Edrick Oh [facility]) - on allopurinol for uric acid kidney stones, and sildenafil for ED.   CHRONIC DM, Type 2 with DM Neuropathy: No new concern. Improved A1c CBGs: Checks CBGs 1-3x daily, has reduced checking recently due to cost of test supplies (>$100 for month of test supplies, unsure which brand is preferred) - Episode of hypoglycemia CBG in 50s rarely 1-2x episodes, improve promptly with PO Meds: Insulin NPH 10 unit twice daily, Metformin 1000mg  BID, Glimepiride 1mg  daily Reports good compliance. Tolerating well w/o side-effects Currently on ACEi Lifestyle: - Diet (balanced diet, tries to follow low carb DM diet)  - Exercise (regular exercise) No confirmed nerve damage but some clinical neuropathy, gradual worsening, both sides of feet, mostly L great toe, mild loss of sensitivity Taking Vitamin B12 Magnesium, MVI, Melatonin. - Admits still need to schedule eye doctor for DM, reduced R eye vision,  CHRONIC HTN: Not checking BP regularly Current Meds - Benazepril 40mg  Reports good compliance, took med today. Tolerating well, w/o complaints.  Anxiety / History of Alcohol Dependence in remission - Reports chronic problem, without depression. Controlled on Celexa, request refill. - Former alcohol abuse, whiskey liquor, quit >4-5 years now, no longer attending [place] meetings, doing well  HYPERLIPIDEMIA / Elevated LFTs - Reports no concerns. Last lipid panel 04/2017 - Not on Statin currently. Previous history of on Simvastatin 40mg  but stopped in 01/2016 by prior PCP due to concern with elevated LFTs, however chart review and per patient this was due to alcohol and not Statin. He has remained with elevated LFTs  >1 year despite off statin therapy. Quit alcohol approx >4 years ago  History of OA/DJD - Reports R hip and low back pain. No confirmed x-rays  Health Maintenance: - Due for Flu Shot, will receive today  - Colon CA Screening: Last test done in 2008 approx with colonoscopy (also had EGD) was told colonoscopy negative without polyps or concern. No known family history of colon CA. Now it has been 10 years since last test, due for next. He is not interested in repeating colonoscopy, and now has decided against Cologuard testing for now, declines this at the moment. - UTD pneumonia vaccine until age 45 - UTD Hep C and HIV screen - UTD Tdap  No flowsheet data found.  Depression screen [facility] 2/9 05/06/2017 05/06/2017 05/06/2017  Decreased Interest 0 0 0  Down, Depressed, Hopeless 0 0 0  PHQ - 2 Score 0 0 0  Altered sleeping 0 - -  Tired, decreased energy 0 - -  Change in appetite 0 - -  Feeling bad or failure about yourself  0 - -  Trouble concentrating 0 - -  Moving slowly or fidgety/restless 0 - -  Suicidal thoughts 0 - -  PHQ-9 Score 0 - -  Difficult doing work/chores Not difficult at all Not difficult at all -    Past Medical History:  Diagnosis Date  . Cholelithiasis   . GERD (gastroesophageal reflux disease)   . Hyperlipidemia   . Hypertension    Past Surgical History:  Procedure Laterality Date  . Lung Collapse  61 years old.    Social History  Social History  . Marital status: Married    Spouse name: N/A  . Number of children: N/A  . Years of education: N/A   Occupational History  . Not on file.   Social History Main Topics  . Smoking status: Former Smoker    Quit date: 08/08/1999  . Smokeless tobacco: Never Used  . Alcohol use No     Comment: Has requested Antabuse at previous PCP visit.   . Drug use: No  . Sexual activity: Yes   Other Topics Concern  . Not on file   Social History Narrative  . No narrative on file   Family History  Problem Relation  Age of Onset  . Diabetes Father        complications of DM caused death  . Heart attack Father   . Diabetes Sister   . Cancer Mother        lung  . Lung cancer Mother    Current Outpatient Prescriptions on File Prior to Visit  Medication Sig  . allopurinol (ZYLOPRIM) 300 MG tablet Take 300 mg by mouth.  Marland Kitchen aspirin 81 MG tablet Take 1 tablet by mouth daily at 6 (six) AM.  . glucose blood (BAYER CONTOUR TEST) test strip Use as instructed to check blood sugars twice a day.  . insulin NPH Human (HUMULIN N,NOVOLIN N) 100 UNIT/ML injection Inject 0.1 mLs (10 Units total) into the skin 2 (two) times daily before a meal.  . Insulin Syringe-Needle U-100 (INSULIN SYRINGE .5CC/30GX1/2") 30G X 1/2" 0.5 ML MISC 1 each by Does not apply route 2 (two) times daily.  . Magnesium Gluconate 500 (27 MG) MG TABS Take by mouth.  . MULTIPLE VITAMIN PO Take by mouth.  . sildenafil (REVATIO) 20 MG tablet Take 3-5 tablets daily by mouth as needed  . tamsulosin (FLOMAX) 0.4 MG CAPS capsule Take by mouth.  . vitamin B-12 (CYANOCOBALAMIN) 1000 MCG tablet Take by mouth.   No current facility-administered medications on file prior to visit.     Review of Systems  Constitutional: Negative for activity change, appetite change, chills, diaphoresis, fatigue and fever.  HENT: Negative for congestion, hearing loss and sinus pressure.   Eyes: Negative for visual disturbance.  Respiratory: Negative for apnea, cough, choking, chest tightness, shortness of breath and wheezing.   Cardiovascular: Negative for chest pain, palpitations and leg swelling.  Gastrointestinal: Negative for abdominal pain, anal bleeding, blood in stool, constipation, diarrhea, nausea and vomiting.  Endocrine: Negative for cold intolerance and polyuria.  Genitourinary: Negative for decreased urine volume, difficulty urinating, dysuria, frequency, hematuria, testicular pain and urgency.       Nocturia  Musculoskeletal: Positive for back pain (mild low  back discomfort recently but none actively). Negative for arthralgias, gait problem and neck pain.  Skin: Negative for rash.  Allergic/Immunologic: Negative for environmental allergies.  Neurological: Negative for dizziness, weakness, light-headedness, numbness and headaches.       Bilateral foot tingling and burning, neuropathy  Hematological: Negative for adenopathy.  Psychiatric/Behavioral: Negative for behavioral problems, dysphoric mood and sleep disturbance. The patient is not nervous/anxious.    Per HPI unless specifically indicated above     Objective:    BP 134/78 (BP Location: Left Arm, Cuff Size: Normal)   Pulse 75   Temp 98.6 F (37 C) (Oral)   Resp 16   Ht 5\' 11"  (1.803 m)   Wt 215 lb (97.5 kg)   BMI 29.99 kg/m   Wt Readings from Last 3 Encounters:  05/06/17  215 lb (97.5 kg)  01/21/17 211 lb 9.6 oz (96 kg)  04/12/16 213 lb (96.6 kg)    Physical Exam  Constitutional: He is oriented to person, place, and time. He appears well-developed and well-nourished. No distress.  Well-appearing, comfortable, cooperative  HENT:  Head: Normocephalic and atraumatic.  Mouth/Throat: Oropharynx is clear and moist.  Nares patent without purulence or edema. Bilateral TMs clear without erythema, effusion or bulging, L TM appears mildly sunken without perforation or other abnormality. Oropharynx clear without erythema, exudates, edema or asymmetry.  Eyes: Pupils are equal, round, and reactive to light. Conjunctivae and EOM are normal. Right eye exhibits no discharge. Left eye exhibits no discharge.  Neck: Normal range of motion. Neck supple. No thyromegaly present.  No carotid bruits  Cardiovascular: Normal rate, regular rhythm, normal heart sounds and intact distal pulses.   No murmur heard. Pulmonary/Chest: Effort normal and breath sounds normal. No respiratory distress. He has no wheezes. He has no rales.  Abdominal: Soft. Bowel sounds are normal. He exhibits no distension and no  mass. There is no tenderness.  Musculoskeletal: Normal range of motion. He exhibits no edema or tenderness.  Upper / Lower Extremities: - Normal muscle tone, strength bilateral upper extremities 5/5, lower extremities 5/5  Lymphadenopathy:    He has no cervical adenopathy.  Neurological: He is alert and oriented to person, place, and time.  Distal sensation intact to light touch all extremities  Skin: Skin is warm and dry. No rash noted. He is not diaphoretic. No erythema.  Psychiatric: He has a normal mood and affect. His behavior is normal.  Well groomed, good eye contact, normal speech and thoughts  Nursing note and vitals reviewed.   Recent Labs  01/21/17 1622 04/30/17 0756  HGBA1C 6.8* 6.3*    Results for orders placed or performed in visit on 04/30/17  COMPLETE METABOLIC PANEL WITH GFR  Result Value Ref Range   Glucose, Bld 150 (H) 65 - 99 mg/dL   BUN 12 7 - 25 mg/dL   Creat 0.96 0.70 - 1.25 mg/dL   GFR, Est Non African American 84 > OR = 60 mL/min/1.41m2   GFR, Est African American 98 > OR = 60 mL/min/1.43m2   BUN/Creatinine Ratio NOT APPLICABLE 6 - 22 (calc)   Sodium 136 135 - 146 mmol/L   Potassium 4.7 3.5 - 5.3 mmol/L   Chloride 100 98 - 110 mmol/L   CO2 25 20 - 32 mmol/L   Calcium 9.6 8.6 - 10.3 mg/dL   Total Protein 7.3 6.1 - 8.1 g/dL   Albumin 4.0 3.6 - 5.1 g/dL   Globulin 3.3 1.9 - 3.7 g/dL (calc)   AG Ratio 1.2 1.0 - 2.5 (calc)   Total Bilirubin 0.5 0.2 - 1.2 mg/dL   Alkaline phosphatase (APISO) 74 40 - 115 U/L   AST 61 (H) 10 - 35 U/L   ALT 73 (H) 9 - 46 U/L  CBC with Differential/Platelet  Result Value Ref Range   WBC 4.7 3.8 - 10.8 Thousand/uL   RBC 4.27 4.20 - 5.80 Million/uL   Hemoglobin 15.0 13.2 - 17.1 g/dL   HCT 42.2 38.5 - 50.0 %   MCV 98.8 80.0 - 100.0 fL   MCH 35.1 (H) 27.0 - 33.0 pg   MCHC 35.5 32.0 - 36.0 g/dL   RDW 13.1 11.0 - 15.0 %   Platelets 158 140 - 400 Thousand/uL   MPV 10.5 7.5 - 12.5 fL   Neutro Abs 2,186 1,500 - 7,800  cells/uL   Lymphs Abs 1,283 850 - 3,900 cells/uL   WBC mixed population 766 200 - 950 cells/uL   Eosinophils Absolute 376 15 - 500 cells/uL   Basophils Absolute 89 0 - 200 cells/uL   Neutrophils Relative % 46.5 %   Total Lymphocyte 27.3 %   Monocytes Relative 16.3 %   Eosinophils Relative 8.0 %   Basophils Relative 1.9 %  Lipid panel  Result Value Ref Range   Cholesterol 236 (H) <200 mg/dL   HDL 40 (L) >40 mg/dL   Triglycerides 300 (H) <150 mg/dL   LDL Cholesterol (Calc) 150 (H) mg/dL (calc)   Total CHOL/HDL Ratio 5.9 (H) <5.0 (calc)   Non-HDL Cholesterol (Calc) 196 (H) <130 mg/dL (calc)  Hemoglobin A1c  Result Value Ref Range   Hgb A1c MFr Bld 6.3 (H) <5.7 % of total Hgb   Mean Plasma Glucose 134 (calc)   eAG (mmol/L) 7.4 (calc)      Assessment & Plan:   Problem List Items Addressed This Visit    Alcohol dependence in remission ([place])    Stable, without any recurrence of alcohol intake >4-5 years Likely etiology for elevated LFTs chronically      Anxiety disorder, unspecified    Stable, controlled chronic anxiety without panic or comorbid depression Prior history suggestive of adjustment disorder Not established with psychiatry / therapy  Plan: 1. Refilled Celexa 20mg  daily 2. Follow-up PRN       Relevant Medications   citalopram (CELEXA) 20 MG tablet   DM (diabetes mellitus), type 2 with neurological complications (HCC)    Well-controlled DM improved A1c 6.3 (from 6.8, still much improved from >21 off meds) Complications - peripheral neuropathy. Hypoglycemia  Plan:  1. STOP Glimepiride 1mg  daily due to hypoglycemia episodes - Anticipate would benefit from transition off NPH insulin and switch to GLP1 agent, list of meds given to consider and check with new ins cost/coverage, notify me of preference - CONTINUE Metformin 1000mg  BID, temporarily Insulin NPH 10u BID 2. Encourage improved lifestyle - low carb, low sugar diet, reduce portion size, continue improving  regular exercise 3. Check CBG, bring log to next visit for review - need new glucometer/test strips that are covered 4. Continue ASA, ACEi - new start Statin therapy 5. Advised to schedule DM ophtho exam, send record 6. Follow-up 3 months DM A1c med adjust      Relevant Medications   rosuvastatin (CRESTOR) 10 MG tablet   metFORMIN (GLUCOPHAGE) 1000 MG tablet   benazepril (LOTENSIN) 40 MG tablet   Elevated LFTs    Mild elevated AST / ALT for few years likely secondary to alcohol intake among other factors possibly some fatty liver. - Seems unlikely related to Statin since elevated before started med and still elevated >1 year after DC'd simvastatin - Start new Statin now low dose - Follow-up 6 months CMET LFT trend - if still elevated consider abdominal US      Essential hypertension    Controlled HTN - Home BP readings none  No known complications    Plan:  1. Continue current BP regimen Benazepril 40mg  daily - refilled 2. Encourage improved lifestyle - low sodium diet, regular exercise 3. Start monitor BP outside office, bring readings to next visit, if persistently >140/90 or new symptoms notify office sooner 4. Follow-up 3 months      Relevant Medications   rosuvastatin (CRESTOR) 10 MG tablet   benazepril (LOTENSIN) 40 MG tablet   GERD (gastroesophageal reflux disease)  Stable, controlled on Famotidine 20mg  BID, refilled today to new mail order Prior EGD 2008 negative      Relevant Medications   famotidine (PEPCID) 20 MG tablet   Hyperlipidemia associated with type 2 diabetes mellitus ([place])    Previously slightly uncontrolled cholesterol with elevated TC, LDL, TG Last lipid panel 04/2017 Calculated ASCVD 10 yr risk score 28.4% and DM Prior statin failed Simvastatin (stopped due to LFTs, however chart review shows unlikely d/t statin since still elevated >1 year off statin, likely from prior alcohol history)  Plan: 1. Discussed ASCVD risk in future - agree to start  back on Statin therapy with Rosuvastatin 10mg  nightly new rx sent, reviewed side effects and precautions 2. Continue ASA 81mg  for primary ASCVD risk reduction 3. Encourage improved lifestyle - low carb/cholesterol, reduce portion size, continue improving regular exercise 4. Follow-up 3 months - review statin HLD - next lipids CMET for LFTs in 6 months      Relevant Medications   rosuvastatin (CRESTOR) 10 MG tablet   metFORMIN (GLUCOPHAGE) 1000 MG tablet   benazepril (LOTENSIN) 40 MG tablet   Screening for colon cancer    Declined cologuard testing at this time Counseling provided Follow-up       Other Visit Diagnoses    Annual physical exam    -  Primary   Needs flu shot       Relevant Orders   Flu Vaccine QUAD 36+ mos IM (Completed)      Meds ordered this encounter  Medications  . Melatonin 10 MG TABS  . rosuvastatin (CRESTOR) 10 MG tablet    Sig: Take 1 tablet (10 mg total) by mouth at bedtime.    Dispense:  90 tablet    Refill:  3  . citalopram (CELEXA) 20 MG tablet    Sig: Take 1 tablet (20 mg total) by mouth daily.    Dispense:  90 tablet    Refill:  3  . famotidine (PEPCID) 20 MG tablet    Sig: Take 1 tablet (20 mg total) by mouth 2 (two) times daily.    Dispense:  180 tablet    Refill:  3  . metFORMIN (GLUCOPHAGE) 1000 MG tablet    Sig: Take 1 tablet (1,000 mg total) by mouth 2 (two) times daily with a meal.    Dispense:  180 tablet    Refill:  3  . benazepril (LOTENSIN) 40 MG tablet    Sig: Take 1 tablet (40 mg total) by mouth daily at 6 (six) AM.    Dispense:  90 tablet    Refill:  3   Follow up plan: Return in about 3 months (around 08/06/2017) for DM A1c med adjust, HLD on statin.  Nobie Putnam, DO [facility] 05/06/2017, 10:28 PM

## 2017-05-06 NOTE — Assessment & Plan Note (Signed)
Stable, without any recurrence of alcohol intake >4-5 years Likely etiology for elevated LFTs chronically

## 2017-05-06 NOTE — Assessment & Plan Note (Signed)
Previously slightly uncontrolled cholesterol with elevated TC, LDL, TG Last lipid panel 04/2017 Calculated ASCVD 10 yr risk score 28.4% and DM Prior statin failed Simvastatin (stopped due to LFTs, however chart review shows unlikely d/t statin since still elevated >1 year off statin, likely from prior alcohol history)  Plan: 1. Discussed ASCVD risk in future - agree to start back on Statin therapy with Rosuvastatin 10mg  nightly new rx sent, reviewed side effects and precautions 2. Continue ASA 81mg  for primary ASCVD risk reduction 3. Encourage improved lifestyle - low carb/cholesterol, reduce portion size, continue improving regular exercise 4. Follow-up 3 months - review statin HLD - next lipids CMET for LFTs in 6 months

## 2017-05-06 NOTE — Assessment & Plan Note (Signed)
Stable, controlled chronic anxiety without panic or comorbid depression Prior history suggestive of adjustment disorder Not established with psychiatry / therapy  Plan: 1. Refilled Celexa 20mg  daily 2. Follow-up PRN

## 2017-05-06 NOTE — Patient Instructions (Addendum)
Thank you for coming to the clinic today.  1. With new insurance - check the preferred option for Diabetic Testing supplies - need to know brand of test strips and may need a new glucometer with a new brand. Also find out if you can how many times a day are you allowed or covered to test, how many strips per month, most insurance for diabetics on Insulin will allow up to 3 times a day testing - Let me know and I can phone these refills in  2.  Call insurance to find out the cost and coverage of the following medications  1. Bydureon BCise (Exenatide ER) - once weekly - this is my preference, very good medicine well tolerated, less side effects of nausea, upset stomach. No dose changes. Cost and coverage is the problem, but we may be able to get it with the coupon card  2. Trulicity (Dulaglutide) - once weekly - this is very good one, usually one of my top choices as well, two doses, 0.75 (likely we would start) and 1.5 max dose. We can use coupon card here too  3. Victoza (Liraglutide) - once DAILY - 3 dose changes 0.6, 1.2 and 1.8, side effects nausea, upset stomach higher on this one but it is still very effective medicine  -----------------------------------------  Oral Medications - SGLT2  1. Wilder Glade - these meds cause you to pee out extra urine, can slightly increase risk of urinary tract infection, need to stay very well hydrated on these  2. Jardiance -------------------------------------  If need a manufacturer's copay coupon discount let me know and we can have it available for pick up at the office, or can mail.  3. Start new cholesterol med Rosuvastatin 10mg  nightly - as discussed if you get muscle cramps and aches, due to medication we may need to stop and re-adjust dose, reducing it to every other night.  Meds sent through [facility]  Please schedule a Follow-up Appointment to: Return in about 3 months (around 08/06/2017) for DM A1c med adjust, HLD on statin.  If you have  any other questions or concerns, please feel free to call the clinic or send a message through [place]. You may also schedule an earlier appointment if necessary.  Additionally, you may be receiving a survey about your experience at our clinic within a few days to 1 week by e-mail or mail. We value your feedback.  Nobie Putnam, DO [place]

## 2017-05-06 NOTE — Assessment & Plan Note (Addendum)
Mild elevated AST / ALT for few years likely secondary to alcohol intake among other factors possibly some fatty liver. - Seems unlikely related to Statin since elevated before started med and still elevated >1 year after DC'd simvastatin - Start new Statin now low dose - Follow-up 6 months CMET LFT trend - if still elevated consider abdominal US

## 2017-05-06 NOTE — Assessment & Plan Note (Signed)
Controlled HTN - Home BP readings none  No known complications    Plan:  1. Continue current BP regimen Benazepril 40mg  daily - refilled 2. Encourage improved lifestyle - low sodium diet, regular exercise 3. Start monitor BP outside office, bring readings to next visit, if persistently >140/90 or new symptoms notify office sooner 4. Follow-up 3 months

## 2017-05-06 NOTE — Assessment & Plan Note (Signed)
Stable, controlled on Famotidine 20mg  BID, refilled today to new mail order Prior EGD 2008 negative

## 2017-05-06 NOTE — Assessment & Plan Note (Signed)
Well-controlled DM improved A1c 6.3 (from 6.8, still much improved from >45 off meds) Complications - peripheral neuropathy. Hypoglycemia  Plan:  1. STOP Glimepiride 1mg  daily due to hypoglycemia episodes - Anticipate would benefit from transition off NPH insulin and switch to GLP1 agent, list of meds given to consider and check with new ins cost/coverage, notify me of preference - CONTINUE Metformin 1000mg  BID, temporarily Insulin NPH 10u BID 2. Encourage improved lifestyle - low carb, low sugar diet, reduce portion size, continue improving regular exercise 3. Check CBG, bring log to next visit for review - need new glucometer/test strips that are covered 4. Continue ASA, ACEi - new start Statin therapy 5. Advised to schedule DM ophtho exam, send record 6. Follow-up 3 months DM A1c med adjust

## 2017-05-06 NOTE — Assessment & Plan Note (Signed)
Declined cologuard testing at this time Counseling provided Follow-up

## 2017-05-09 DIAGNOSIS — K4020 Bilateral inguinal hernia, without obstruction or gangrene, not specified as recurrent: Secondary | ICD-10-CM | POA: Diagnosis not present

## 2017-05-09 DIAGNOSIS — R972 Elevated prostate specific antigen [PSA]: Secondary | ICD-10-CM | POA: Diagnosis not present

## 2017-05-09 DIAGNOSIS — N403 Nodular prostate with lower urinary tract symptoms: Secondary | ICD-10-CM | POA: Diagnosis not present

## 2017-05-09 DIAGNOSIS — R339 Retention of urine, unspecified: Secondary | ICD-10-CM | POA: Insufficient documentation

## 2017-05-09 DIAGNOSIS — N200 Calculus of kidney: Secondary | ICD-10-CM | POA: Diagnosis not present

## 2017-07-15 ENCOUNTER — Other Ambulatory Visit: Payer: Self-pay | Admitting: Family Medicine

## 2017-07-15 DIAGNOSIS — S0500XA Injury of conjunctiva and corneal abrasion without foreign body, unspecified eye, initial encounter: Secondary | ICD-10-CM | POA: Diagnosis not present

## 2017-07-15 DIAGNOSIS — E1149 Type 2 diabetes mellitus with other diabetic neurological complication: Secondary | ICD-10-CM

## 2017-07-15 MED ORDER — METFORMIN HCL 1000 MG PO TABS: 1000.0000 mg | ORAL_TABLET | Freq: Two times a day (BID) | ORAL | 3 refills | Status: DC

## 2017-07-15 NOTE — Telephone Encounter (Signed)
Pt. Called requesting refill on  Metformin called into  [facility] [address]

## 2017-07-16 DIAGNOSIS — S0500XA Injury of conjunctiva and corneal abrasion without foreign body, unspecified eye, initial encounter: Secondary | ICD-10-CM | POA: Diagnosis not present

## 2017-09-13 ENCOUNTER — Telehealth: Payer: Self-pay | Admitting: Family Medicine

## 2017-09-13 DIAGNOSIS — E1149 Type 2 diabetes mellitus with other diabetic neurological complication: Secondary | ICD-10-CM

## 2017-09-13 DIAGNOSIS — I10 Essential (primary) hypertension: Secondary | ICD-10-CM

## 2017-09-13 NOTE — Telephone Encounter (Signed)
Received request from [facility] via fax that patient has been on duplicate therapy for ACEi with Benazepril 40mg  and Lisinopril 40mg  for few weeks.  Reviewed chart, prior visit in 01/2017, has the explanation. He advised [place] at that time he was taking Lisinopril, and this rx was sent, but then later it was determined he was actually on Benazepril, therefore he was advised to NOT even fill the Lisinopril and only take the Benazepril.  However it seems that there was some breakdown in communication and he has taken both.  Our current chart and med rec does not even have Lisinopril listed as this was discontinued back in 01/2017.  --------------------------------  Earlier today, he was notified by Frederich Cha CMA to continue only Benazepril and stop Lisinopril.  I called him to confirm and he understands the current plan.  We will fax note to pharmacy to discontinue Lisinopril rx.  I advised him to follow-up, as he is overdue, he can schedule fasting lab only in next 2-4 weeks and a follow-up OV for PreDM A1c and HTN and lab review at that time, to review K and Cr after some period of duplicate ACEi treatment.  He will call back to schedule.  Nobie Putnam, [facility] 09/13/2017, 6:02 PM

## 2017-09-16 NOTE — Telephone Encounter (Signed)
Called the pharmacy and confirm about deactivation for Lisinopril.

## 2017-11-21 ENCOUNTER — Other Ambulatory Visit: Payer: BLUE CROSS/BLUE SHIELD

## 2017-11-21 DIAGNOSIS — E1149 Type 2 diabetes mellitus with other diabetic neurological complication: Secondary | ICD-10-CM

## 2017-11-21 DIAGNOSIS — I10 Essential (primary) hypertension: Secondary | ICD-10-CM

## 2017-11-22 LAB — BASIC METABOLIC PANEL
BUN: 8 mg/dL (ref 7–25)
CALCIUM: 9.5 mg/dL (ref 8.6–10.3)
CHLORIDE: 97 mmol/L — AB (ref 98–110)
CO2: 23 mmol/L (ref 20–32)
Creat: 0.85 mg/dL (ref 0.70–1.25)
Glucose, Bld: 163 mg/dL — ABNORMAL HIGH (ref 65–99)
Potassium: 4 mmol/L (ref 3.5–5.3)
Sodium: 137 mmol/L (ref 135–146)

## 2017-11-22 LAB — HEMOGLOBIN A1C
Hgb A1c MFr Bld: 6.5 % of total Hgb — ABNORMAL HIGH (ref <5.7)
MEAN PLASMA GLUCOSE: 140 (calc)
eAG (mmol/L): 7.7 (calc)

## 2017-11-25 ENCOUNTER — Ambulatory Visit: Payer: BLUE CROSS/BLUE SHIELD | Admitting: Family Medicine

## 2017-11-25 ENCOUNTER — Encounter: Payer: Self-pay | Admitting: Family Medicine

## 2017-11-25 VITALS — BP 120/75 | HR 76 | Temp 98.6°F | Resp 16 | Ht 71.0 in | Wt 198.6 lb

## 2017-11-25 DIAGNOSIS — E1149 Type 2 diabetes mellitus with other diabetic neurological complication: Secondary | ICD-10-CM | POA: Diagnosis not present

## 2017-11-25 DIAGNOSIS — F1021 Alcohol dependence, in remission: Secondary | ICD-10-CM | POA: Diagnosis not present

## 2017-11-25 DIAGNOSIS — F339 Major depressive disorder, recurrent, unspecified: Secondary | ICD-10-CM | POA: Diagnosis not present

## 2017-11-25 DIAGNOSIS — F411 Generalized anxiety disorder: Secondary | ICD-10-CM

## 2017-11-25 MED ORDER — CITALOPRAM HYDROBROMIDE 40 MG PO TABS: 40.0000 mg | ORAL_TABLET | Freq: Every day | ORAL | 3 refills | Status: DC

## 2017-11-25 MED ORDER — TRAZODONE HCL 50 MG PO TABS: 50.0000 mg | ORAL_TABLET | Freq: Every day | ORAL | 3 refills | Status: DC

## 2017-11-25 NOTE — Assessment & Plan Note (Signed)
Concern with acute worsening anxiety with GAD in setting of recurrence of MDD, secondary insomnia Seems financial stressor is key trigger Affecting him physically see HPI Not established with Psychiatry or counseling/therapy Failed Duloxetine (Cymbalta), Buspar  Plan - INCREASE dose Citalopram from 20 to 40mg  daily - new rx sent- may take 4-6 week for full effect - ADD Trazodone 50mg  nightly for mood and insomnia, adjust dose over 2-4 week if need to 75 (x 1.5 tab) or to 100mg  (x 2) - Encourage continue trying to resolve financial stressor as he is attempting to locate appropriate work - Recommend future psychiatry or therapist if need - defer referral for now - Follow-up 4-6 week if not improve - med adjust, consider alternatives adjunctive therapy with either Wellbutrin, Buspar, Mirtazapine - reviewed options today

## 2017-11-25 NOTE — Assessment & Plan Note (Signed)
Stable, without any recurrence of alcohol intake >5 years

## 2017-11-25 NOTE — Assessment & Plan Note (Signed)
Well-controlled DM stable A1c 6.5, previously 6.8 to 6.3, past history >96 Complications - peripheral neuropathy - Resolved hypoglycemia  Plan:  1. Again advised DISCONTINUE Glimepiride 1mg  daily due to history of hypoglycemia episodes - CONTINUE Metformin 1000mg  BID, Insulin NPH Relion 10u BID - Future may consider GLP1 - did not review specifically today but have discussed before 2. Encourage improved lifestyle - low carb, low sugar diet, reduce portion size, continue improving regular exercise 3. Check CBG, bring log to next visit for review 4. Continue ASA, ACEi, Statin therapy 5. Advised to schedule DM ophtho exam, send record 6. Follow-up 3 months DM A1c med adjust

## 2017-11-25 NOTE — Progress Notes (Signed)
Subjective:    Patient ID: Richard Columbia Sr., male    DOB: 11-Oct-1955, 62 y.o.   MRN: [id number]  Richard HENRIQUES Sr. is a 62 y.o. male presenting on 11/25/2017 for Diabetes (ranges 120) and Depression   HPI   CHRONIC DM, Type 2 with DM Neuropathy: Recent A1c trend stable from 6.3 to 6.8 - now today he has recent lab A1c 6.5 CBGs: Checks CBGs 1-3x daily, has reduced checking recently due to cost of test supplies Meds: Insulin NPH (Relion brand) 10 unit twice daily, Metformin 1000mg  BID, Glimepiride 1mg  daily Reports good compliance. Tolerating well w/o side-effects Currently on ACEi Lifestyle: - Diet (balanced diet, tries to follow low carb DM diet - now recently with mood, reduced appetite and less intake) - Exercise (regular exercise) Bilateral feet, mild loss of sensitivity Taking Vitamin B12 Magnesium, MVI, Melatonin. - Admits still need to schedule eye doctor for DM Eye Exam overdue in 2019 Denies hypoglycemia, polyuria, visual changes or tingling.  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, he has possible job in near future - Taking Citalopram 20mg  daily and doing well but now current stressor too much for this med, looking for temporary solution - Not followed by Psychiatry or therapist / counselor - He was able to sleep occasionally with melatonin double x 2 for 10mg  and benadryl - Former alcohol abuse, quit >5 years now, has not resumed alcohol intake -  Past medications tried Duloxetine (Cymbalta), Buspar - uncertain results on these meds See PHQ GAD for ROS Denies any suicidal or homicidal ideation   Depression screen [facility] 2/9 11/25/2017 11/25/2017 05/06/2017  Decreased Interest 3 3 0  Down, Depressed, Hopeless 3 3 0  PHQ - 2 Score 6 6 0  Altered sleeping 3 3 0  Tired, decreased energy 3 3 0  Change in appetite 3 3 0  Feeling bad or failure about yourself  3 3 0  Trouble concentrating 2 2 0  Moving slowly or fidgety/restless 1 1 0  Suicidal thoughts 0 0 0  PHQ-9 Score 21 21 0  Difficult doing work/chores Very difficult Very difficult Not difficult at all   GAD 7 : Generalized Anxiety Score 11/25/2017  Nervous, Anxious, on Edge 3  Control/stop worrying 3  Worry too much - different things 3  Trouble relaxing 2  Restless 1  Easily annoyed or irritable 1  Afraid - awful might happen 2  Total GAD 7 Score 15  Anxiety Difficulty Very difficult    Social History   Tobacco Use  . Smoking status: Former Smoker    Last attempt to quit: 08/08/1999    Years since quitting: 18.3  . Smokeless tobacco: Never Used  Substance Use Topics  . Alcohol use: No    Alcohol/week: 0.0 oz    Comment: Has requested Antabuse at previous PCP visit.   . Drug use: No    Review of Systems Per HPI unless specifically indicated above     Objective:    BP 120/75  Pulse 76   Temp 98.6 F (37 C) (Oral)   Resp 16   Ht 5\' 11"  (1.803 m)   Wt 198 lb 9.6 oz (90.1 kg)   BMI 27.70 kg/m   Wt Readings from Last 3 Encounters:  11/25/17 198 lb 9.6 oz (90.1 kg)  05/06/17 215 lb (97.5 kg)  01/21/17 211 lb 9.6 oz (96 kg)    Physical Exam  Constitutional: He is oriented to person, place, and time. He appears well-developed and well-nourished. No distress.  Well-appearing, comfortable, cooperative  HENT:  Head: Normocephalic and atraumatic.  Mouth/Throat: Oropharynx is clear and moist.  Eyes: Conjunctivae are normal. Right eye exhibits no discharge.  Left eye exhibits no discharge.  Neck: Normal range of motion.  Cardiovascular: Normal rate, regular rhythm, normal heart sounds and intact distal pulses.  No murmur heard. Pulmonary/Chest: Effort normal. No respiratory distress.  Musculoskeletal: Normal range of motion. He exhibits no edema.  Neurological: He is alert and oriented to person, place, and time.  Skin: Skin is warm and dry. No rash noted. He is not diaphoretic. No erythema.  Psychiatric: He has a normal mood and affect. His behavior is normal.  Well groomed, good eye contact, normal speech and thoughts. Does not appear anxious. Good insight into health and mental health. No crying spells today.  Nursing note and vitals reviewed.  Recent Labs    01/21/17 1622 04/30/17 0756 11/21/17 0804  HGBA1C 6.8* 6.3* 6.5*    Results for orders placed or performed in visit on 76/19/50  Basic Metabolic Panel (BMET)  Result Value Ref Range   Glucose, Bld 163 (H) 65 - 99 mg/dL   BUN 8 7 - 25 mg/dL   Creat 0.85 0.70 - 1.25 mg/dL   BUN/Creatinine Ratio NOT APPLICABLE 6 - 22 (calc)   Sodium 137 135 - 146 mmol/L   Potassium 4.0 3.5 - 5.3 mmol/L   Chloride 97 (L) 98 - 110 mmol/L   CO2 23 20 - 32 mmol/L   Calcium 9.5 8.6 - 10.3 mg/dL  Hemoglobin A1c  Result Value Ref Range   Hgb A1c MFr Bld 6.5 (H) <5.7 % of total Hgb   Mean Plasma Glucose 140 (calc)   eAG (mmol/L) 7.7 (calc)      Assessment & Plan:   Problem List Items Addressed This Visit    Alcohol dependence in remission ([place])    Stable, without any recurrence of alcohol intake >5 years      DM (diabetes mellitus), type 2 with neurological complications ([place]) - Primary    Well-controlled DM stable A1c 6.5, previously 6.8 to 6.3, past history >93 Complications - peripheral neuropathy - Resolved hypoglycemia  Plan:  1. Again advised DISCONTINUE Glimepiride 1mg  daily due to history of hypoglycemia episodes - CONTINUE Metformin 1000mg  BID, Insulin NPH Relion 10u BID -  Future may consider GLP1 - did not review specifically today but have discussed before 2. Encourage improved lifestyle - low carb, low sugar diet, reduce portion size, continue improving regular exercise 3. Check CBG, bring log to next visit for review 4. Continue ASA, ACEi, Statin therapy 5. Advised to schedule DM ophtho exam, send record 6. Follow-up 3 months DM A1c med adjust      GAD (generalized anxiety disorder)    Concern with acute worsening anxiety with GAD in setting of recurrence of MDD, secondary insomnia Seems financial stressor is key trigger Affecting him physically see HPI Not established with Psychiatry or counseling/therapy Failed Duloxetine (Cymbalta), Buspar  Plan -  INCREASE dose Citalopram from 20 to 40mg  daily - new rx sent- may take 4-6 week for full effect - ADD Trazodone 50mg  nightly for mood and insomnia, adjust dose over 2-4 week if need to 75 (x 1.5 tab) or to 100mg  (x 2) - Encourage continue trying to resolve financial stressor as he is attempting to locate appropriate work - Recommend future psychiatry or therapist if need - defer referral for now - Follow-up 4-6 week if not improve - med adjust, consider alternatives adjunctive therapy with either Wellbutrin, Buspar, Mirtazapine - reviewed options today      Relevant Medications   traZODone (DESYREL) 50 MG tablet   citalopram (CELEXA) 40 MG tablet   Major depression, recurrent, chronic ([place])    Concern with acute recurrence of MDD, also comorbid anxiety and secondary insomnia Seems financial stressor is key trigger Affecting him physically see HPI Not established with Psychiatry or counseling/therapy Failed Duloxetine (Cymbalta), Buspar  Plan - INCREASE dose Citalopram from 20 to 40mg  daily - new rx sent- may take 4-6 week for full effect - ADD Trazodone 50mg  nightly for mood and insomnia, adjust dose over 2-4 week if need to 75 (x 1.5 tab) or to 100mg  (x 2) - Encourage continue trying to resolve  financial stressor as he is attempting to locate appropriate work - Recommend future psychiatry or therapist if need - defer referral for now - Follow-up 4-6 week if not improve - med adjust, consider alternatives adjunctive therapy with either Wellbutrin, Buspar, Mirtazapine - reviewed options today      Relevant Medications   traZODone (DESYREL) 50 MG tablet   citalopram (CELEXA) 40 MG tablet      Meds ordered this encounter  Medications  . traZODone (DESYREL) 50 MG tablet    Sig: Take 1 tablet (50 mg total) by mouth at bedtime. If needed can take 1.5 tabs or 2 tabs for dose 75 to 100mg  if not effective after 4 weeks    Dispense:  30 tablet    Refill:  3  . citalopram (CELEXA) 40 MG tablet    Sig: Take 1 tablet (40 mg total) by mouth daily.    Dispense:  90 tablet    Refill:  3    Follow up plan: Return in about 3 months (around 02/25/2018) for Diabetes A1c, Mood/Anxiety/Insom PHQ/GAD med adjust.  Nobie Putnam, DO [facility] 11/25/2017, 1:01 PM

## 2017-11-25 NOTE — Patient Instructions (Addendum)
Thank you for coming to the office today.  Start higher dose Citalopram now 40mg  - once daily - may take few days to weeks to get full effect or more stable  Goal to use higher dose for at least 3 to 6 months, then taper back to half dose 20mg   Added Trazodone 50mg  nightly for sleep and mood - after 2-4 weeks, may increase dose to 1.5 pills = 75mg  or eventually can try 2 pills for 100mg  if need  May still take Melatonin, and rarely Benadryl but try to avoid  If not effective for anxiety let me know we can still add the Buspar medicine  STOP Glimepiride and we will follow-up sugar in 3 months  Sleep Hygiene Recommendations to promote healthy sleep in all patients, especially if symptoms of insomnia are worsening. Due to the nature of sleep rhythms, if your body gets "out of rhythm", it may take some time before your sleep cycle can be "reset".  Please try to follow as many of the following tips as you can, usually there are only a few of these are the primary cause of the problem.  ?To reset your sleep rhythm, go to bed and get up at the same time every day ?Sleep only long enough to feel rested and then get out of bed ?Do not try to force yourself to sleep. If you can't sleep, get out of bed and try again later. ?Avoid naps during the day, unless excessively tired. The more sleeping during the day, then the less sleep your body needs at night.  ?Have coffee, tea, and other foods that have caffeine only in the morning ?Exercise several days a week, but not right before bed ?If you drink alcohol, prefer to have appropriate drink with one meal, but prefer to avoid alcohol in the evening, and bedtime ?If you smoke, avoid smoking, especially in the evening  ?Avoid watching TV or looking at phones, computers, or reading devices ("e-books") that give off light at least 30 minutes before bed. This artificial light sends "awake signals" to your brain and can make it harder to fall asleep. ?Make  your bedroom a comfortable place where it is easy to fall asleep: ? Put up shades or special blackout curtains to block light from outside. ? Use a white noise machine to block noise. ? Keep the temperature cool. ?Try your best to solve or at least address your problems before you go to bed ?Use relaxation techniques to manage stress. Ask your health care provider to suggest some techniques that may work well for you. These may include: ? Breathing exercises. ? Routines to release muscle tension. ? Visualizing peaceful scenes.   Please schedule a Follow-up Appointment to: Return in about 3 months (around 02/25/2018) for Diabetes A1c, Mood/Anxiety/Insom PHQ/GAD med adjust.    sooner 3-6 week if not improve  If you have any other questions or concerns, please feel free to call the office or send a message through [place]. You may also schedule an earlier appointment if necessary.  Additionally, you may be receiving a survey about your experience at our office within a few days to 1 week by e-mail or mail. We value your feedback.  Nobie Putnam, DO [facility]

## 2017-11-25 NOTE — Assessment & Plan Note (Signed)
Concern with acute recurrence of MDD, also comorbid anxiety and secondary insomnia Seems financial stressor is key trigger Affecting him physically see HPI Not established with Psychiatry or counseling/therapy Failed Duloxetine (Cymbalta), Buspar  Plan - INCREASE dose Citalopram from 20 to 40mg  daily - new rx sent- may take 4-6 week for full effect - ADD Trazodone 50mg  nightly for mood and insomnia, adjust dose over 2-4 week if need to 75 (x 1.5 tab) or to 100mg  (x 2) - Encourage continue trying to resolve financial stressor as he is attempting to locate appropriate work - Recommend future psychiatry or therapist if need - defer referral for now - Follow-up 4-6 week if not improve - med adjust, consider alternatives adjunctive therapy with either Wellbutrin, Buspar, Mirtazapine - reviewed options today

## 2017-11-26 ENCOUNTER — Encounter: Payer: Self-pay | Admitting: Family Medicine

## 2017-11-26 DIAGNOSIS — F411 Generalized anxiety disorder: Secondary | ICD-10-CM

## 2017-11-26 MED ORDER — CITALOPRAM HYDROBROMIDE 20 MG PO TABS: 20.0000 mg | ORAL_TABLET | Freq: Every day | ORAL | 3 refills | Status: DC

## 2018-01-01 ENCOUNTER — Encounter: Payer: Self-pay | Admitting: Family Medicine

## 2018-01-01 DIAGNOSIS — F339 Major depressive disorder, recurrent, unspecified: Secondary | ICD-10-CM

## 2018-01-01 MED ORDER — TRAZODONE HCL 50 MG PO TABS: 75.0000 mg | ORAL_TABLET | Freq: Every day | ORAL | 3 refills | Status: DC

## 2018-01-03 ENCOUNTER — Encounter: Payer: Self-pay | Admitting: Urology

## 2018-01-03 ENCOUNTER — Ambulatory Visit (INDEPENDENT_AMBULATORY_CARE_PROVIDER_SITE_OTHER): Payer: BLUE CROSS/BLUE SHIELD | Admitting: Urology

## 2018-01-03 VITALS — BP 127/79 | HR 97 | Ht 71.0 in | Wt 200.2 lb

## 2018-01-03 DIAGNOSIS — N5201 Erectile dysfunction due to arterial insufficiency: Secondary | ICD-10-CM

## 2018-01-03 DIAGNOSIS — R350 Frequency of micturition: Secondary | ICD-10-CM

## 2018-01-03 DIAGNOSIS — N401 Enlarged prostate with lower urinary tract symptoms: Secondary | ICD-10-CM | POA: Diagnosis not present

## 2018-01-03 DIAGNOSIS — Z87898 Personal history of other specified conditions: Secondary | ICD-10-CM

## 2018-01-03 MED ORDER — TAMSULOSIN HCL 0.4 MG PO CAPS: 0.8000 mg | ORAL_CAPSULE | Freq: Every day | ORAL | 3 refills | Status: DC

## 2018-01-03 NOTE — Progress Notes (Signed)
01/03/2018 4:10 PM   Cooper Render Sprankle Sr. Dec 11, 1955 [id number]  Referring provider: [name], DO [address] [place], [place] [address]  Chief Complaint  Patient presents with  . Establish Care    HPI: Quashaun Lazalde is a 62 year old male who presents for transfer of urologic care.  He has seen Dr. Jacqlyn Larsen for many years and the last saw him at [facility] in November 2018.  He underwent a prostate biopsy in 2007 for an elevated PSA with findings of ASAP.  A rebiopsy was performed which was negative.  He does not know his PSA level at the time of this biopsy and I was unable to locate in reviewing his records.  His last PSA in November 2018 was 1.27.  He  He has been on tamsulosin for BPH.  Since his last visit he has noted worsening urinary frequency, intermittency, hesitancy.  He denies dysuria or gross hematuria.  Denies flank, abdominal, pelvic or scrotal pain.  He has ED and is using generic sildenafil 20 mg with good results.  He has a history of uric acid urolithiasis and his last stone was over 10 years ago.  He is currently on allopurinol.   PMH: Past Medical History:  Diagnosis Date  . Anxiety   . Cholelithiasis   . Depression   . GERD (gastroesophageal reflux disease)   . Hyperlipidemia   . Hypertension     Surgical History: Past Surgical History:  Procedure Laterality Date  . Lung Collapse  62 years old.     Home Medications:  Allergies as of 01/03/2018      Reactions   Bee Venom Swelling   Itching       Medication List        Accurate as of 01/03/18  4:10 PM. Always use your most recent med list.          allopurinol 300 MG tablet Commonly known as:  ZYLOPRIM Take 300 mg by mouth.   aspirin 81 MG tablet Take 1 tablet by mouth daily at 6 (six) AM.   benazepril 40 MG tablet Commonly known as:  LOTENSIN Take 1 tablet (40 mg total) by mouth daily at 6 (six) AM.   CALCIUM ACETATE PO Take by mouth.   citalopram 20 MG tablet Commonly known as:   CELEXA Take 1 tablet (20 mg total) by mouth daily.   glucose blood test strip Commonly known as:  BAYER CONTOUR TEST Use as instructed to check blood sugars twice a day.   insulin NPH Human 100 UNIT/ML injection Commonly known as:  HUMULIN N,NOVOLIN N Inject 0.1 mLs (10 Units total) into the skin 2 (two) times daily before a meal.   INSULIN SYRINGE .5CC/30GX1/2" 30G X 1/2" 0.5 ML Misc 1 each by Does not apply route 2 (two) times daily.   Magnesium Gluconate 500 (27 Mg) MG Tabs Take by mouth.   Melatonin 10 MG Tabs   metFORMIN 1000 MG tablet Commonly known as:  GLUCOPHAGE Take 1 tablet (1,000 mg total) by mouth 2 (two) times daily with a meal.   MULTIPLE VITAMIN PO Take by mouth.   omeprazole 20 MG capsule Commonly known as:  PRILOSEC Take 20 mg by mouth daily.   sildenafil 20 MG tablet Commonly known as:  REVATIO Take 3-5 tablets daily by mouth as needed   tamsulosin 0.4 MG Caps capsule Commonly known as:  FLOMAX Take by mouth.   traZODone 50 MG tablet Commonly known as:  DESYREL Take 1.5  tablets (75 mg total) by mouth at bedtime.   vitamin B-12 1000 MCG tablet Commonly known as:  CYANOCOBALAMIN Take by mouth.       Allergies:  Allergies  Allergen Reactions  . Bee Venom Swelling    Itching     Family History: Family History  Problem Relation Age of Onset  . Diabetes Father        complications of DM caused death  . Heart attack Father   . Diabetes Sister   . Cancer Mother        lung  . Lung cancer Mother     Social History:  reports that he quit smoking about 18 years ago. He quit after 10.00 years of use. He has never used smokeless tobacco. He reports that he does not drink alcohol or use drugs.  ROS: UROLOGY Frequent Urination?: Yes Hard to postpone urination?: Yes Burning/pain with urination?: Yes Get up at night to urinate?: Yes Leakage of urine?: Yes Urine stream starts and stops?: Yes Trouble starting stream?: Yes Do you have to  strain to urinate?: No Blood in urine?: No Urinary tract infection?: No Sexually transmitted disease?: No Injury to kidneys or bladder?: No Painful intercourse?: No Weak stream?: Yes Erection problems?: Yes Penile pain?: No  Gastrointestinal Nausea?: No Vomiting?: No Indigestion/heartburn?: Yes Diarrhea?: No Constipation?: No  Constitutional Fever: No Night sweats?: Yes Weight loss?: No Fatigue?: No  Skin Skin rash/lesions?: No Itching?: No  Eyes Blurred vision?: No Double vision?: No  Ears/Nose/Throat Sore throat?: No Sinus problems?: No  Hematologic/Lymphatic Swollen glands?: No Easy bruising?: No  Cardiovascular Leg swelling?: No Chest pain?: No  Respiratory Cough?: No Shortness of breath?: No  Endocrine Excessive thirst?: Yes  Musculoskeletal Back pain?: Yes Joint pain?: No  Neurological Headaches?: No Dizziness?: No  Psychologic Depression?: Yes Anxiety?: Yes  Physical Exam: BP 127/79 (BP Location: Left Arm, Patient Position: Sitting, Cuff Size: Large)   Pulse 97   Ht 5\' 11"  (1.803 m)   Wt 200 lb 3.2 oz (90.8 kg)   BMI 27.92 kg/m   Constitutional:  Alert and oriented, No acute distress. HEENT: [place] AT, moist mucus membranes.  Trachea midline, no masses. Cardiovascular: No clubbing, cyanosis, or edema. Respiratory: Normal respiratory effort, no increased work of breathing. GI: Abdomen is soft, nontender, nondistended, no abdominal masses GU: No CVA tenderness.  Prostate 35 g, smooth without nodules Lymph: No cervical or inguinal lymphadenopathy. Skin: No rashes, bruises or suspicious lesions. Neurologic: Grossly intact, no focal deficits, moving all 4 extremities. Psychiatric: Normal mood and affect.   Assessment & Plan:   62 year old male with a history of an elevated PSA and ASAP with negative rebiopsy.  Stable ED with good efficacy on sildenafil.  History of uric acid nephrolithiasis symptom-free for at least a decade.  He has  worsening lower urinary tract symptoms on tamsulosin and will initially increased to 0.8 mg. Follow-up 4-6 weeks for symptom reassessment, bladder scan for PVR and urinalysis.  Return 4-6 weeks, for Recheck, Bladder scan,ua.   [name], [place] [address], [place], [place] [address] [phone]

## 2018-01-08 ENCOUNTER — Encounter: Payer: Self-pay | Admitting: Urology

## 2018-02-07 ENCOUNTER — Ambulatory Visit (INDEPENDENT_AMBULATORY_CARE_PROVIDER_SITE_OTHER): Payer: BLUE CROSS/BLUE SHIELD

## 2018-02-07 ENCOUNTER — Other Ambulatory Visit: Payer: Self-pay

## 2018-02-07 DIAGNOSIS — R350 Frequency of micturition: Secondary | ICD-10-CM

## 2018-02-07 DIAGNOSIS — N401 Enlarged prostate with lower urinary tract symptoms: Secondary | ICD-10-CM | POA: Diagnosis not present

## 2018-02-07 LAB — URINALYSIS, COMPLETE
BILIRUBIN UA: NEGATIVE
Glucose, UA: NEGATIVE
Ketones, UA: NEGATIVE
Leukocytes, UA: NEGATIVE
Nitrite, UA: NEGATIVE
Protein, UA: NEGATIVE
RBC UA: NEGATIVE
SPEC GRAV UA: 1.02 (ref 1.005–1.030)
UUROB: 0.2 mg/dL (ref 0.2–1.0)
pH, UA: 7 (ref 5.0–7.5)

## 2018-02-07 LAB — BLADDER SCAN AMB NON-IMAGING: SCAN RESULT: 0

## 2018-02-07 NOTE — Progress Notes (Signed)
Richard Day is present in office today for a nurse visit.  He saw Dr. Bernardo Heater back on 01/03/18 for BPH with worsening lower urinary tract symptoms. Per Dr. Bernardo Heater, pt was to return in 4-6 weeks for recheck of urina and bladder scan. Pt voided in office and a bladder scan was performed, noting 4ml of urine in the bladder. Pt urine sample looked completely clear of any abnormalities.

## 2018-02-20 ENCOUNTER — Other Ambulatory Visit: Payer: Self-pay

## 2018-02-20 DIAGNOSIS — F339 Major depressive disorder, recurrent, unspecified: Secondary | ICD-10-CM

## 2018-02-20 MED ORDER — TRAZODONE HCL 50 MG PO TABS: 75.0000 mg | ORAL_TABLET | Freq: Every day | ORAL | 3 refills | Status: DC

## 2018-02-20 NOTE — Telephone Encounter (Signed)
Received pharmacy request

## 2018-02-26 ENCOUNTER — Ambulatory Visit: Payer: BLUE CROSS/BLUE SHIELD | Admitting: Family Medicine

## 2018-02-26 ENCOUNTER — Encounter: Payer: Self-pay | Admitting: Family Medicine

## 2018-02-26 VITALS — BP 112/65 | HR 62 | Temp 98.1°F | Resp 16 | Ht 71.0 in | Wt 196.8 lb

## 2018-02-26 DIAGNOSIS — E1149 Type 2 diabetes mellitus with other diabetic neurological complication: Secondary | ICD-10-CM

## 2018-02-26 DIAGNOSIS — L219 Seborrheic dermatitis, unspecified: Secondary | ICD-10-CM

## 2018-02-26 DIAGNOSIS — F339 Major depressive disorder, recurrent, unspecified: Secondary | ICD-10-CM

## 2018-02-26 MED ORDER — KETOCONAZOLE 2 % EX SHAM: 1.0000 "application " | MEDICATED_SHAMPOO | CUTANEOUS | 0 refills | Status: DC

## 2018-02-26 MED ORDER — TRIAMCINOLONE ACETONIDE 0.5 % EX CREA: 1.0000 "application " | TOPICAL_CREAM | Freq: Two times a day (BID) | CUTANEOUS | 0 refills | Status: DC

## 2018-02-26 NOTE — Progress Notes (Signed)
Subjective:    Patient ID: Richard Columbia Sr., male    DOB: 10/16/1955, 62 y.o.   MRN: [id number]  Richard EMPEY Sr. is a 62 y.o. male presenting on 02/26/2018 for Diabetes   HPI   CHRONIC DM, Type 2 with DM Neuropathy: Last visit for DM 11/2017, he was doing better with controlled A1c 6.5 w/ insulin NPH and metformin, and was advised to limit or stop glimepiride due to history of hypoglycemia. - in interval he recently had significantly elevated CBGs reported in a mychart message to me on 02/18/18 with sugars avg 300 to 500, he admitted to being out of town 4 days and vacation did not have any insulin and he was eating more sugar than usual. - Today here with now back on insulin and medications. He has not noticed significant improvement in A1c. CBGs:Checks CBGs 1-3x daily,has reduced checkingrecently due to cost of test supplies Meds: Insulin NPH (Relion brand) 10 unit twice daily, Metformin 1000mg  BID, Glimepiride 1mg  daily - Failed Victoza in past Reports good compliance. Tolerating well w/o side-effects Currently on ACEi Lifestyle: - Diet (balanced diet, tries to follow low carb DM diet - now recently with mood, reduced appetite and less intake) - Exercise (regular exercise) Bilateral feet, mild loss of sensitivity Taking Vitamin B12 Magnesium, MVI, Melatonin. Admits blurry vision when had hyperglycemia and it seems to have improved now - admits still need to schedule eye doctor for DM Eye Exam overdue in 2019 - Admits Gradual worsening tingling and neuropathy Denies hypoglycemia, polyuria, visual changes or tingling.   Depression, chronic recurrent / Insomnia Not focus of visit today. Update since last visit, he was treated with increased dose of Trazodone from 50 up to 75mg  nightly. This has significantly improved his sleep and function, he was doing better at first and then seemed slight reduced effectiveness. He was considering if may need dose up to 100mg  in future, but he  does not think quite needs it yet.  Scalp dermatitis Reports multiples areas of "bumps" or raised areas within scalp and back of neck, describes itching, without dandruff or flaking dry skin. He has tried different soaps and changing products. No improvement. Has not seen Dermatology Denies pain, drainage of pus, fever, redness  Additional update - Followed by Urology Dr Bernardo Heater doubled Tamsulosin 0.4mg  BID now doing well.  Health Maintenance: Due for Flu Shot will return  Depression screen [facility] 2/9 02/26/2018 11/25/2017 11/25/2017  Decreased Interest 0 3 3  Down, Depressed, Hopeless 0 3 3  PHQ - 2 Score 0 6 6  Altered sleeping 0 3 3  Tired, decreased energy 1 3 3   Change in appetite 1 3 3   Feeling bad or failure about yourself  0 3 3  Trouble concentrating 1 2 2   Moving slowly or fidgety/restless 0 1 1  Suicidal thoughts 0 0 0  PHQ-9 Score 3 21 21   Difficult doing work/chores Not difficult at all Very difficult Very difficult    Social History   Tobacco Use  . Smoking status: Former Smoker    Years: 10.00    Last attempt to quit: 08/08/1999    Years since quitting: 18.5  . Smokeless tobacco: Former Network engineer Use Topics  . Alcohol use: No    Alcohol/week: 0.0 standard drinks    Comment: Has requested Antabuse at previous PCP visit.   . Drug use: No    Review of Systems Per HPI unless specifically indicated above  Objective:    BP 112/65   Pulse 62   Temp 98.1 F (36.7 C) (Oral)   Resp 16   Ht 5\' 11"  (1.803 m)   Wt 196 lb 12.8 oz (89.3 kg)   BMI 27.45 kg/m   Wt Readings from Last 3 Encounters:  02/26/18 196 lb 12.8 oz (89.3 kg)  01/03/18 200 lb 3.2 oz (90.8 kg)  11/25/17 198 lb 9.6 oz (90.1 kg)    Physical Exam  Constitutional: He is oriented to person, place, and time. He appears well-developed and well-nourished. No distress.  Well-appearing, comfortable, cooperative  HENT:  Head: Normocephalic and atraumatic.  Mouth/Throat: Oropharynx is clear  and moist.  Eyes: Conjunctivae are normal. Right eye exhibits no discharge. Left eye exhibits no discharge.  Neck: Normal range of motion. Neck supple. No thyromegaly present.  Cardiovascular: Normal rate, regular rhythm, normal heart sounds and intact distal pulses.  No murmur heard. Pulmonary/Chest: Effort normal and breath sounds normal. No respiratory distress. He has no wheezes. He has no rales.  Musculoskeletal: Normal range of motion. He exhibits no edema.  Lymphadenopathy:    He has no cervical adenopathy.  Neurological: He is alert and oriented to person, place, and time.  Skin: Skin is warm and dry. He is not diaphoretic. No erythema.  Few scattered areas on scalp and neck, posteriorly with small < 0.5 cm to 1-2 mm papular lesions, non tender, no erythema. No ulceration.  Psychiatric: He has a normal mood and affect. His behavior is normal.  Well groomed, good eye contact, normal speech and thoughts  Nursing note and vitals reviewed.    Recent Labs    04/30/17 0756 11/21/17 0804 02/26/18 0849  HGBA1C 6.3* 6.5* 12.7*    Diabetic Foot Exam - Simple   Simple Foot Form Diabetic Foot exam was performed with the following findings:  Yes 02/26/2018  8:36 AM  Visual Inspection No deformities, no ulcerations, no other skin breakdown bilaterally:  Yes Sensation Testing See comments:  Yes Pulse Check Posterior Tibialis and Dorsalis pulse intact bilaterally:  Yes Comments Left foot plantar MTP great toe area with reduced sensation otherwise all other areas intact. No deformity.    Results for orders placed or performed in visit on 02/26/18  Hemoglobin A1c  Result Value Ref Range   Hgb A1c MFr Bld 12.7 (H) <5.7 % of total Hgb   Mean Plasma Glucose 318 (calc)   eAG (mmol/L) 17.6 (calc)      Assessment & Plan:   Problem List Items Addressed This Visit    DM (diabetes mellitus), type 2 with neurological complications ([place]) - Primary    Now recent acutely uncontrolled DM  with Hyperglycemia A1c POC >14 and then repeat serum A1c 12.7, previously A1c 6s 3 months ago Complications - peripheral neuropathy, other including hyperlipidemia, GERD, depression - increases risk of future cardiovascular complications poor glucose control due to reduced lifestyle diet/exercise with low energy mood and fatigue  Plan:  1. After review of A1c lab (after visit, next day) - NEW START GLP1 - Ozempic titrate up from 0.25mg  weekly x 4 weeks to 0.5mg  weekly for up to 3 month then likely inc to 1mg . He may pick up copay card from office. - Referral to [facility] Endocrinology for DM management - may need other diagnostic and eval - INCREASE insulin NPH Relion to 11u BID for now titrate up 1-2 units q 1 week if fasting CBG >170 consistently - CONTINUE Metformin 1000mg  BID, Insulin NPH Relion 10u BID  2. Encourage improved lifestyle - low carb, low sugar diet, reduce portion size, continue improving regular exercise 3. Check CBG, bring log to next visit for review 4. Continue ASA, ACEi, Statin therapy 5. Advised to schedule DM ophtho exam, send record 6. Follow-up 3 months annual and review endocrine      Relevant Medications   glimepiride (AMARYL) 1 MG tablet   Semaglutide (OZEMPIC) 0.25 or 0.5 MG/DOSE SOPN   Other Relevant Orders   Hemoglobin A1c (Completed)   Ambulatory referral to Endocrinology   Major depression, recurrent, chronic ([place])    See last note A&P for details Update today, doing well with improved insomnia sleep now on Trazodone 75mg  nightly (1.5 tabs) - future reconsider dose inc up to 100mg        Other Visit Diagnoses    Seborrheic dermatitis of scalp       Clinically uncertain exact diagnosis, seems to have various scalp superficial lesions, will treat as seb derm - topical ketoconazole for scalp / steroid topical   Relevant Medications   triamcinolone cream (KENALOG) 0.5 %   ketoconazole (NIZORAL) 2 % shampoo      Orders Placed This Encounter  Procedures   . Hemoglobin A1c  . Ambulatory referral to Endocrinology    Referral Priority:   Routine    Referral Type:   Consultation    Referral Reason:   Specialty Services Required    Referred to Provider:   Judi Cong, MD    Requested Specialty:   Endocrinology    Number of Visits Requested:   1     Meds ordered this encounter  Medications  . triamcinolone cream (KENALOG) 0.5 %    Sig: Apply 1 application topically 2 (two) times daily. To affected areas, for up to 2 weeks.    Dispense:  30 g    Refill:  0  . ketoconazole (NIZORAL) 2 % shampoo    Sig: Apply 1 application topically 2 (two) times a week. As needed for scalp dermatitis    Dispense:  120 mL    Refill:  0  . Semaglutide (OZEMPIC) 0.25 or 0.5 MG/DOSE SOPN    Sig: Inject 0.25 mg into the skin once a week. For first 4 weeks. Then increase dose to 0.5mg  weekly    Dispense:  1 pen    Refill:  2    Follow up plan: Return in about 3 months (around 05/29/2018) for Annual Physical (DM updates).  Future labs ordered for 05/26/18  Nobie Putnam, [facility] 02/26/2018, 8:36 AM

## 2018-02-26 NOTE — Patient Instructions (Addendum)
Thank you for coming to the office today.  A1c today on fingerstick >14 - abnormal reading, as discussed uncertain if this current trend is accurate  We will re-check a serum A1c today - stay tuned for result.  Keep checking sugars.  You may need to increase Relion insulin and likely we will add a new GLP1 medication listed below either before referral or done by Endocrine specialist  If high reading - referral will be sent to Diabetes specialist - stay tuned for referral, call them if not heard back  [facility] [place], [place]  [address] Phone: [phone]  ----------  Call insurance find cost and coverage of the following  1. Ozempic (Semaglutide injection) - start 0.25mg  weekly for 4 weeks then increase to 0.5mg  weekly - This one has best benefit of weight loss and reducing Cardiovascular events  2. Bydureon BCise (Exenatide ER) - once weekly - this is my preference, very good medicine well tolerated, less side effects of nausea, upset stomach. No dose changes. Cost and coverage is the problem, but we may be able to get it with the coupon card  3. Trulicity (Dulaglutide) - once weekly - this is very good one, usually one of my top choices as well, two doses, 0.75 (likely we would start) and 1.5 max dose. We can use coupon card here too  4. Victoza (Liraglutide) - once DAILY - 3 dose changes 0.6, 1.2 and 1.8, side effects nausea, upset stomach higher on this one but it is still very effective medicine  ----------------------------------------  Please schedule Diabetic Eye Exam - send [place] a copy of the report.  Please schedule and return for a NURSE ONLY VISIT for VACCINE - Approximately around 04/2018 - Need Dose Flu Vaccine  ----------------------------------------------- DUE for FASTING BLOOD WORK (no food or drink after midnight before the lab appointment, only water or coffee without cream/sugar on the morning of)  SCHEDULE "Lab Only"  visit in the morning at the clinic for lab draw in 3 MONTHS   - Make sure Lab Only appointment is at about 1 week before your next appointment, so that results will be available  For Lab Results, once available within 2-3 days of blood draw, you can can log in to MyChart online to view your results and a brief explanation. Also, we can discuss results at next follow-up visit.    Please schedule a Follow-up Appointment to: Return in about 3 months (around 05/29/2018) for Annual Physical (DM updates).  If you have any other questions or concerns, please feel free to call the office or send a message through [place]. You may also schedule an earlier appointment if necessary.  Additionally, you may be receiving a survey about your experience at our office within a few days to 1 week by e-mail or mail. We value your feedback.  Nobie Putnam, DO [place]

## 2018-02-27 ENCOUNTER — Other Ambulatory Visit: Payer: Self-pay | Admitting: Family Medicine

## 2018-02-27 ENCOUNTER — Encounter: Payer: Self-pay | Admitting: Family Medicine

## 2018-02-27 DIAGNOSIS — I10 Essential (primary) hypertension: Secondary | ICD-10-CM

## 2018-02-27 DIAGNOSIS — E1169 Type 2 diabetes mellitus with other specified complication: Secondary | ICD-10-CM

## 2018-02-27 DIAGNOSIS — E785 Hyperlipidemia, unspecified: Secondary | ICD-10-CM

## 2018-02-27 DIAGNOSIS — F411 Generalized anxiety disorder: Secondary | ICD-10-CM

## 2018-02-27 DIAGNOSIS — Z0000 Encounter for general adult medical examination without abnormal findings: Secondary | ICD-10-CM

## 2018-02-27 DIAGNOSIS — E1149 Type 2 diabetes mellitus with other diabetic neurological complication: Secondary | ICD-10-CM

## 2018-02-27 DIAGNOSIS — F339 Major depressive disorder, recurrent, unspecified: Secondary | ICD-10-CM

## 2018-02-27 LAB — HEMOGLOBIN A1C
EAG (MMOL/L): 17.6 (calc)
Hgb A1c MFr Bld: 12.7 % of total Hgb — ABNORMAL HIGH (ref <5.7)
MEAN PLASMA GLUCOSE: 318 (calc)

## 2018-02-27 MED ORDER — SEMAGLUTIDE(0.25 OR 0.5MG/DOS) 2 MG/1.5ML ~~LOC~~ SOPN: 0.2500 mg | PEN_INJECTOR | SUBCUTANEOUS | 2 refills | Status: DC

## 2018-02-27 NOTE — Assessment & Plan Note (Signed)
See last note A&P for details Update today, doing well with improved insomnia sleep now on Trazodone 75mg  nightly (1.5 tabs) - future reconsider dose inc up to 100mg 

## 2018-02-27 NOTE — Assessment & Plan Note (Signed)
Now recent acutely uncontrolled DM with Hyperglycemia A1c POC >14 and then repeat serum A1c 12.7, previously A1c 6s 3 months ago Complications - peripheral neuropathy, other including hyperlipidemia, GERD, depression - increases risk of future cardiovascular complications poor glucose control due to reduced lifestyle diet/exercise with low energy mood and fatigue  Plan:  1. After review of A1c lab (after visit, next day) - NEW START GLP1 - Ozempic titrate up from 0.25mg  weekly x 4 weeks to 0.5mg  weekly for up to 3 month then likely inc to 1mg . He may pick up copay card from office. - Referral to [facility] Endocrinology for DM management - may need other diagnostic and eval - INCREASE insulin NPH Relion to 11u BID for now titrate up 1-2 units q 1 week if fasting CBG >170 consistently - CONTINUE Metformin 1000mg  BID, Insulin NPH Relion 10u BID 2. Encourage improved lifestyle - low carb, low sugar diet, reduce portion size, continue improving regular exercise 3. Check CBG, bring log to next visit for review 4. Continue ASA, ACEi, Statin therapy 5. Advised to schedule DM ophtho exam, send record 6. Follow-up 3 months annual and review endocrine

## 2018-02-28 ENCOUNTER — Other Ambulatory Visit: Payer: Self-pay | Admitting: Family Medicine

## 2018-02-28 DIAGNOSIS — E1149 Type 2 diabetes mellitus with other diabetic neurological complication: Secondary | ICD-10-CM

## 2018-03-04 DIAGNOSIS — E113393 Type 2 diabetes mellitus with moderate nonproliferative diabetic retinopathy without macular edema, bilateral: Secondary | ICD-10-CM | POA: Diagnosis not present

## 2018-03-04 LAB — HM DIABETES EYE EXAM

## 2018-03-07 ENCOUNTER — Encounter: Payer: Self-pay | Admitting: Family Medicine

## 2018-04-23 ENCOUNTER — Telehealth: Payer: Self-pay | Admitting: Urology

## 2018-04-23 NOTE — Telephone Encounter (Signed)
Patient is requesting a new Rx for allopurinol 300mg  to be sent to [facility] on [address].  He has run out of the Rx that Dr. Jacqlyn Larsen sent in for him.  Patient was last seen on 01/03/18 by Dr. Bernardo Heater.

## 2018-04-25 ENCOUNTER — Other Ambulatory Visit: Payer: Self-pay | Admitting: Family Medicine

## 2018-04-25 MED ORDER — ALLOPURINOL 300 MG PO TABS: 300.0000 mg | ORAL_TABLET | Freq: Every day | ORAL | 11 refills | Status: DC

## 2018-04-25 NOTE — Telephone Encounter (Signed)
RX sent

## 2018-05-26 ENCOUNTER — Other Ambulatory Visit: Payer: BLUE CROSS/BLUE SHIELD

## 2018-05-26 DIAGNOSIS — I10 Essential (primary) hypertension: Secondary | ICD-10-CM

## 2018-05-26 DIAGNOSIS — F339 Major depressive disorder, recurrent, unspecified: Secondary | ICD-10-CM

## 2018-05-26 DIAGNOSIS — E785 Hyperlipidemia, unspecified: Secondary | ICD-10-CM | POA: Diagnosis not present

## 2018-05-26 DIAGNOSIS — E1169 Type 2 diabetes mellitus with other specified complication: Secondary | ICD-10-CM

## 2018-05-26 DIAGNOSIS — E1149 Type 2 diabetes mellitus with other diabetic neurological complication: Secondary | ICD-10-CM

## 2018-05-26 DIAGNOSIS — Z0000 Encounter for general adult medical examination without abnormal findings: Secondary | ICD-10-CM

## 2018-05-27 LAB — COMPLETE METABOLIC PANEL WITH GFR
AG RATIO: 1.5 (calc) (ref 1.0–2.5)
ALBUMIN MSPROF: 4.4 g/dL (ref 3.6–5.1)
ALT: 21 U/L (ref 9–46)
AST: 22 U/L (ref 10–35)
Alkaline phosphatase (APISO): 77 U/L (ref 40–115)
BILIRUBIN TOTAL: 0.4 mg/dL (ref 0.2–1.2)
BUN: 19 mg/dL (ref 7–25)
CO2: 29 mmol/L (ref 20–32)
Calcium: 10 mg/dL (ref 8.6–10.3)
Chloride: 99 mmol/L (ref 98–110)
Creat: 1 mg/dL (ref 0.70–1.25)
GFR, Est African American: 92 mL/min/{1.73_m2} (ref >=60)
GFR, Est Non African American: 80 mL/min/{1.73_m2} (ref >=60)
GLOBULIN: 2.9 g/dL (ref 1.9–3.7)
Glucose, Bld: 140 mg/dL — ABNORMAL HIGH (ref 65–99)
POTASSIUM: 5 mmol/L (ref 3.5–5.3)
Sodium: 138 mmol/L (ref 135–146)
TOTAL PROTEIN: 7.3 g/dL (ref 6.1–8.1)

## 2018-05-27 LAB — LIPID PANEL
Cholesterol: 173 mg/dL (ref <200)
HDL: 29 mg/dL — AB (ref >40)
LDL Cholesterol (Calc): 115 mg/dL (calc) — ABNORMAL HIGH
Non-HDL Cholesterol (Calc): 144 mg/dL (calc) — ABNORMAL HIGH (ref <130)
TRIGLYCERIDES: 172 mg/dL — AB (ref <150)
Total CHOL/HDL Ratio: 6 (calc) — ABNORMAL HIGH (ref <5.0)

## 2018-05-27 LAB — CBC WITH DIFFERENTIAL/PLATELET
BASOS ABS: 88 {cells}/uL (ref 0–200)
Basophils Relative: 1.1 %
EOS PCT: 6.5 %
Eosinophils Absolute: 520 cells/uL — ABNORMAL HIGH (ref 15–500)
HEMATOCRIT: 41.8 % (ref 38.5–50.0)
Hemoglobin: 14.6 g/dL (ref 13.2–17.1)
Lymphs Abs: 1928 cells/uL (ref 850–3900)
MCH: 32.2 pg (ref 27.0–33.0)
MCHC: 34.9 g/dL (ref 32.0–36.0)
MCV: 92.3 fL (ref 80.0–100.0)
MPV: 10.2 fL (ref 7.5–12.5)
Monocytes Relative: 8.8 %
NEUTROS PCT: 59.5 %
Neutro Abs: 4760 cells/uL (ref 1500–7800)
Platelets: 208 10*3/uL (ref 140–400)
RBC: 4.53 10*6/uL (ref 4.20–5.80)
RDW: 13.2 % (ref 11.0–15.0)
Total Lymphocyte: 24.1 %
WBC mixed population: 704 cells/uL (ref 200–950)
WBC: 8 10*3/uL (ref 3.8–10.8)

## 2018-05-27 LAB — HEMOGLOBIN A1C
EAG (MMOL/L): 9.3 (calc)
HEMOGLOBIN A1C: 7.5 %{Hb} — AB (ref <5.7)
Mean Plasma Glucose: 169 (calc)

## 2018-05-27 LAB — TSH: TSH: 3.17 mIU/L (ref 0.40–4.50)

## 2018-05-27 LAB — T4, FREE: Free T4: 1.1 ng/dL (ref 0.8–1.8)

## 2018-06-02 ENCOUNTER — Encounter: Payer: Self-pay | Admitting: Family Medicine

## 2018-06-02 ENCOUNTER — Ambulatory Visit (INDEPENDENT_AMBULATORY_CARE_PROVIDER_SITE_OTHER): Payer: BLUE CROSS/BLUE SHIELD | Admitting: Family Medicine

## 2018-06-02 VITALS — BP 121/73 | HR 73 | Temp 98.3°F | Resp 16 | Ht 71.0 in | Wt 195.8 lb

## 2018-06-02 DIAGNOSIS — Z0000 Encounter for general adult medical examination without abnormal findings: Secondary | ICD-10-CM | POA: Diagnosis not present

## 2018-06-02 DIAGNOSIS — B360 Pityriasis versicolor: Secondary | ICD-10-CM

## 2018-06-02 DIAGNOSIS — E1149 Type 2 diabetes mellitus with other diabetic neurological complication: Secondary | ICD-10-CM

## 2018-06-02 DIAGNOSIS — F339 Major depressive disorder, recurrent, unspecified: Secondary | ICD-10-CM

## 2018-06-02 DIAGNOSIS — Z23 Encounter for immunization: Secondary | ICD-10-CM

## 2018-06-02 DIAGNOSIS — I10 Essential (primary) hypertension: Secondary | ICD-10-CM

## 2018-06-02 DIAGNOSIS — E1169 Type 2 diabetes mellitus with other specified complication: Secondary | ICD-10-CM

## 2018-06-02 DIAGNOSIS — N401 Enlarged prostate with lower urinary tract symptoms: Secondary | ICD-10-CM

## 2018-06-02 DIAGNOSIS — F1021 Alcohol dependence, in remission: Secondary | ICD-10-CM

## 2018-06-02 DIAGNOSIS — E785 Hyperlipidemia, unspecified: Secondary | ICD-10-CM

## 2018-06-02 DIAGNOSIS — N138 Other obstructive and reflux uropathy: Secondary | ICD-10-CM

## 2018-06-02 MED ORDER — SEMAGLUTIDE (1 MG/DOSE) 2 MG/1.5ML ~~LOC~~ SOPN: 1.0000 mg | PEN_INJECTOR | SUBCUTANEOUS | 5 refills | Status: DC

## 2018-06-02 MED ORDER — FLUCONAZOLE 150 MG PO TABS: 300.0000 mg | ORAL_TABLET | ORAL | 1 refills | Status: DC

## 2018-06-02 MED ORDER — GLUCOSE BLOOD VI STRP: ORAL_STRIP | 12 refills | Status: DC

## 2018-06-02 MED ORDER — TRAZODONE HCL 50 MG PO TABS: 75.0000 mg | ORAL_TABLET | Freq: Every day | ORAL | 3 refills | Status: DC

## 2018-06-02 MED ORDER — SAW PALMETTO (SERENOA REPENS) 160 MG PO CAPS: 160.0000 mg | ORAL_CAPSULE | Freq: Two times a day (BID) | ORAL | Status: AC

## 2018-06-02 MED ORDER — BENAZEPRIL HCL 40 MG PO TABS: 40.0000 mg | ORAL_TABLET | Freq: Every day | ORAL | 3 refills | Status: DC

## 2018-06-02 NOTE — Patient Instructions (Addendum)
Thank you for coming to the office today.  Great job with A1c control now. Keep up the great diet plan and lifestyle.  Increase Ozempic from 0.5 up to 74m WEEKLY injection - new rx higher dose pen sent - 2 doses per pen = 2 pens per month.  Continue Metformin  Remain off Glimepiride  GO AHEAD AND REDUCE Insulin - down to 5 units twice a day - after first 1-2 weeks, may go ahead and STOP completely or reduce to single dose, especially if low sugar < 90 more than twice a week.  -------------------------------------  Flu Shot today  -------------------------  Colon Cancer Screening: - For all adults age 63+routine colon cancer screening is highly recommended.     - [place] 453- Early detection of colon cancer is important, because often there are no warning signs or symptoms, also if found early usually it can be cured. Late stage is hard to treat.  - If you are not interested in Colonoscopy screening (if done and normal you could be cleared for 5 to 10 years until next due), then Cologuard is an excellent alternative for screening test for Colon Cancer. It is highly sensitive for detecting DNA of colon cancer from even the earliest stages. Also, there is NO bowel prep required. - If Cologuard is NEGATIVE, then it is good for 3 years before next due - If Cologuard is POSITIVE, then it is strongly advised to get a Colonoscopy, which allows the GI doctor to locate the source of the cancer or polyp (even very early stage) and treat it by removing it. ------------------------- If you would like to proceed with Cologuard (stool DNA test) - FIRST, call your insurance company and tell them you want to check cost of Cologuard tell them CPT Code [id number](it may be completely covered and you could get for no cost, OR max cost without any coverage is about $600). Also, keep in mind if you do NOT open the kit, and decide not to do the test,  you will NOT be charged, you should contact the company if you decide not to do the test. - If you want to proceed, you can notify [place](phone message, [place] or at next visit) and we will order it for you. The test kit will be delivered to you house within about 1 week. Follow instructions to collect sample, you may call the company for any help or questions, 24/7 telephone support at [phone]    Please schedule a Follow-up Appointment to: Return in about 6 months (around 12/01/2018) for DM A1c, med adjust.  If you have any other questions or concerns, please feel free to call the office or send a message through [place] You may also schedule an earlier appointment if necessary.  Additionally, you may be receiving a survey about your experience at our office within a few days to 1 week by e-mail or mail. We value your feedback.  ANobie Putnam DO [place]

## 2018-06-02 NOTE — Progress Notes (Signed)
Subjective:    Patient ID: Richard Columbia Sr., male    DOB: 01-02-1956, 62 y.o.   MRN: [id number]  Richard BROUILLARD Sr. is a 62 y.o. male presenting on 06/02/2018 for Annual Exam   HPI  Here for Annual Physical and Lab Review  CHRONIC DM, Type 2 with DM Neuropathy: Last visit 02/26/18 started Ozempic 0.25 then up to 0.5mg  - Interval - shows A1c from 12 to 7.5 - Today reports overall he has done well on Ozempic. He has remained OFF Glimpeiride. Still taking insulin. Noticed that if skipped breakfast, on updated diet low carb plan he had a low sugar 80s, now avg fasting AM sugar of 120s, previously 180s. Also he states learned more on carbohydrates and has overhauled diet improved in past few weeks more so. CBGs:Checks CBGs 1-3x daily Meds: - Ozempic 0.5mg  weekly - Insulin NPH(Relion brand)10 unit twice daily - Metformin 1000mg  BID - Failed Victoza in past, OFF Glimepiride Reports good compliance. Tolerating well w/o side-effects Currently on ACEi Lifestyle: - Diet (balanced diet, tries to follow low carb DM diet- now recently with mood, reduced appetite and less intake) - Exercise (regular exercise) Bilateral feet, mild loss of sensitivity Taking Vitamin B12 Magnesium, MVI, Melatonin. Admits blurry vision when had hyperglycemia and it seems to have improved now - admits still need to schedule eye doctor for [facility] Exam overdue in 2019 - Admits Gradual worsening tingling and neuropathy - admits inc sensitivity in feet bilateral affecting part of lower leg now. No pain. Also has localized injury to foot slower healing over 3 weeks without drainage or redness. Declines gabapentin, takes OTC medicine for neuropathy Denies hypoglycemia, polyuria, visual changes or tingling.  BPH Followed by Dr Gemma Payor Weaned off tamsulosin Now on [facility], doing well. He has improved urination and ED symptoms Off cytobetasterol - diarrhea  Major Depression, recurrent / Insomnia Reports his  mood has been good overall without new concerns. Continued on medication. He has improved on Trazodone 1.5 pills for 75mg  nightly for insomnia.  Additional complaints - Hoarse voice - new problem, no clear onset or trigger - duration now about few months. He feels only partial improvement, not significant - Tinea veriscolor- multiple areas now on scalp and upper back arms, with itchy rash, previously improved on topical ketoconazole anti fungal now recurrent   Health Maintenance: Due for Flu Shot, will receive today   Last colonoscopy approx 2008. He has declined in interval due to ins coverage and other concerns. Now he plans to change insurance - may consider Cologuard testing now once switch and check cost.   Depression screen [facility] 2/9 06/02/2018 02/26/2018 11/25/2017  Decreased Interest 0 0 3  Down, Depressed, Hopeless 0 0 3  PHQ - 2 Score 0 0 6  Altered sleeping 0 0 3  Tired, decreased energy 0 1 3  Change in appetite 0 1 3  Feeling bad or failure about yourself  0 0 3  Trouble concentrating 0 1 2  Moving slowly or fidgety/restless 0 0 1  Suicidal thoughts 0 0 0  PHQ-9 Score 0 3 21  Difficult doing work/chores Not difficult at all Not difficult at all Very difficult    Past Medical History:  Diagnosis Date  . Anxiety   . Cholelithiasis   . GERD (gastroesophageal reflux disease)   . Hyperlipidemia    Past Surgical History:  Procedure Laterality Date  . Lung Collapse  62 years old.    Social History  Socioeconomic History  . Marital status: Married    Spouse name: Not on file  . Number of children: Not on file  . Years of education: Not on file  . Highest education level: Not on file  Occupational History  . Not on file  Social Needs  . Financial resource strain: Not on file  . Food insecurity:    Worry: Not on file    Inability: Not on file  . Transportation needs:    Medical: Not on file    Non-medical: Not on file  Tobacco Use  . Smoking status: Former  Smoker    Years: 10.00    Last attempt to quit: 08/08/1999    Years since quitting: 18.8  . Smokeless tobacco: Former Network engineer and Sexual Activity  . Alcohol use: No    Alcohol/week: 0.0 standard drinks    Comment: Has requested Antabuse at previous PCP visit.   . Drug use: No  . Sexual activity: Yes  Lifestyle  . Physical activity:    Days per week: Not on file    Minutes per session: Not on file  . Stress: Not on file  Relationships  . Social connections:    Talks on phone: Not on file    Gets together: Not on file    Attends religious service: Not on file    Active member of club or organization: Not on file    Attends meetings of clubs or organizations: Not on file    Relationship status: Not on file  . Intimate partner violence:    Fear of current or ex partner: Not on file    Emotionally abused: Not on file    Physically abused: Not on file    Forced sexual activity: Not on file  Other Topics Concern  . Not on file  Social History Narrative  . Not on file   Family History  Problem Relation Age of Onset  . Diabetes Father        complications of DM caused death  . Heart attack Father   . Diabetes Sister   . Cancer Mother        lung  . Lung cancer Mother    Current Outpatient Medications on File Prior to Visit  Medication Sig  . allopurinol (ZYLOPRIM) 300 MG tablet Take 1 tablet (300 mg total) by mouth daily.  Marland Kitchen aspirin 81 MG tablet Take 1 tablet by mouth daily at 6 (six) AM.  . Calcium Acetate, Phos Binder, (CALCIUM ACETATE PO) Take by mouth.  . citalopram (CELEXA) 20 MG tablet Take 1 tablet (20 mg total) by mouth daily.  . Insulin Syringe-Needle U-100 (INSULIN SYRINGE .5CC/30GX1/2") 30G X 1/2" 0.5 ML MISC 1 each by Does not apply route 2 (two) times daily.  Marland Kitchen ketoconazole (NIZORAL) 2 % shampoo Apply 1 application topically 2 (two) times a week. As needed for scalp dermatitis  . Magnesium Gluconate 500 (27 MG) MG TABS Take by mouth.  . Melatonin 10 MG  TABS   . metFORMIN (GLUCOPHAGE) 1000 MG tablet Take 1 tablet (1,000 mg total) by mouth 2 (two) times daily with a meal.  . MULTIPLE VITAMIN PO Take by mouth.  Marland Kitchen NOVOLIN N RELION 100 UNIT/ML injection INJECT 10 UNITS SUBCUTANEOUSLY TWICE DAILY BEFORE MEAL(S)  . sildenafil (REVATIO) 20 MG tablet Take 3-5 tablets daily by mouth as needed  . triamcinolone cream (KENALOG) 0.5 % Apply 1 application topically 2 (two) times daily. To affected areas, for up to 2  weeks.  . vitamin B-12 (CYANOCOBALAMIN) 1000 MCG tablet Take by mouth.   No current facility-administered medications on file prior to visit.     Review of Systems  Constitutional: Negative for activity change, appetite change, chills, diaphoresis, fatigue and fever.  HENT: Negative for congestion and hearing loss.   Eyes: Negative for visual disturbance.  Respiratory: Negative for apnea, cough, choking, chest tightness, shortness of breath and wheezing.   Cardiovascular: Negative for chest pain, palpitations and leg swelling.  Gastrointestinal: Negative for abdominal pain, constipation, diarrhea, nausea and vomiting.  Endocrine: Negative for cold intolerance.  Genitourinary: Negative for decreased urine volume, difficulty urinating, dysuria, frequency, hematuria and urgency.  Musculoskeletal: Negative for arthralgias, back pain and neck pain.  Skin: Negative for rash.  Allergic/Immunologic: Negative for environmental allergies.  Neurological: Negative for dizziness, weakness, light-headedness, numbness and headaches.  Hematological: Negative for adenopathy.  Psychiatric/Behavioral: Negative for behavioral problems, dysphoric mood and sleep disturbance. The patient is not nervous/anxious.    Per HPI unless specifically indicated above     Objective:    BP 121/73   Pulse 73   Temp 98.3 F (36.8 C) (Oral)   Resp 16   Ht 5\' 11"  (1.803 m)   Wt 195 lb 12.8 oz (88.8 kg)   BMI 27.31 kg/m   Wt Readings from Last 3 Encounters:    06/02/18 195 lb 12.8 oz (88.8 kg)  02/26/18 196 lb 12.8 oz (89.3 kg)  01/03/18 200 lb 3.2 oz (90.8 kg)    Physical Exam  Constitutional: He is oriented to person, place, and time. He appears well-developed and well-nourished. No distress.  Well-appearing, comfortable, cooperative  HENT:  Head: Normocephalic and atraumatic.  Mouth/Throat: Oropharynx is clear and moist.  Frontal / maxillary sinuses non-tender. Nares patent without purulence or edema. Bilateral TMs clear without erythema, effusion or bulging. Oropharynx clear without erythema, exudates, edema or asymmetry.  Eyes: Pupils are equal, round, and reactive to light. Conjunctivae and EOM are normal. Right eye exhibits no discharge. Left eye exhibits no discharge.  Neck: Normal range of motion. Neck supple. No thyromegaly present.  No carotid bruits  Cardiovascular: Normal rate, regular rhythm, normal heart sounds and intact distal pulses.  No murmur heard. Pulmonary/Chest: Effort normal and breath sounds normal. No respiratory distress. He has no wheezes. He has no rales.  Abdominal: Soft. Bowel sounds are normal. He exhibits no distension and no mass. There is no tenderness.  Musculoskeletal: Normal range of motion. He exhibits no edema or tenderness.  Upper / Lower Extremities: - Normal muscle tone, strength bilateral upper extremities 5/5, lower extremities 5/5  Lymphadenopathy:    He has no cervical adenopathy.  Neurological: He is alert and oriented to person, place, and time.  Distal sensation intact to light touch all extremities  Skin: Skin is warm and dry. No rash noted. He is not diaphoretic. No erythema.  Psychiatric: He has a normal mood and affect. His behavior is normal.  Well groomed, good eye contact, normal speech and thoughts  Nursing note and vitals reviewed.  Results for orders placed or performed in visit on 05/26/18  T4, free  Result Value Ref Range   Free T4 1.1 0.8 - 1.8 ng/dL  TSH  Result Value Ref  Range   TSH 3.17 0.40 - 4.50 mIU/L  Lipid panel  Result Value Ref Range   Cholesterol 173 <200 mg/dL   HDL 29 (L) >40 mg/dL   Triglycerides 172 (H) <150 mg/dL   LDL Cholesterol (Calc) 115 (  H) mg/dL (calc)   Total CHOL/HDL Ratio 6.0 (H) <5.0 (calc)   Non-HDL Cholesterol (Calc) 144 (H) <130 mg/dL (calc)  COMPLETE METABOLIC PANEL WITH GFR  Result Value Ref Range   Glucose, Bld 140 (H) 65 - 99 mg/dL   BUN 19 7 - 25 mg/dL   Creat 1.00 0.70 - 1.25 mg/dL   GFR, Est Non African American 80 > OR = 60 mL/min/1.47m2   GFR, Est African American 92 > OR = 60 mL/min/1.57m2   BUN/Creatinine Ratio NOT APPLICABLE 6 - 22 (calc)   Sodium 138 135 - 146 mmol/L   Potassium 5.0 3.5 - 5.3 mmol/L   Chloride 99 98 - 110 mmol/L   CO2 29 20 - 32 mmol/L   Calcium 10.0 8.6 - 10.3 mg/dL   Total Protein 7.3 6.1 - 8.1 g/dL   Albumin 4.4 3.6 - 5.1 g/dL   Globulin 2.9 1.9 - 3.7 g/dL (calc)   AG Ratio 1.5 1.0 - 2.5 (calc)   Total Bilirubin 0.4 0.2 - 1.2 mg/dL   Alkaline phosphatase (APISO) 77 40 - 115 U/L   AST 22 10 - 35 U/L   ALT 21 9 - 46 U/L  CBC with Differential/Platelet  Result Value Ref Range   WBC 8.0 3.8 - 10.8 Thousand/uL   RBC 4.53 4.20 - 5.80 Million/uL   Hemoglobin 14.6 13.2 - 17.1 g/dL   HCT 41.8 38.5 - 50.0 %   MCV 92.3 80.0 - 100.0 fL   MCH 32.2 27.0 - 33.0 pg   MCHC 34.9 32.0 - 36.0 g/dL   RDW 13.2 11.0 - 15.0 %   Platelets 208 140 - 400 Thousand/uL   MPV 10.2 7.5 - 12.5 fL   Neutro Abs 4,760 1,500 - 7,800 cells/uL   Lymphs Abs 1,928 850 - 3,900 cells/uL   WBC mixed population 704 200 - 950 cells/uL   Eosinophils Absolute 520 (H) 15 - 500 cells/uL   Basophils Absolute 88 0 - 200 cells/uL   Neutrophils Relative % 59.5 %   Total Lymphocyte 24.1 %   Monocytes Relative 8.8 %   Eosinophils Relative 6.5 %   Basophils Relative 1.1 %  Hemoglobin A1c  Result Value Ref Range   Hgb A1c MFr Bld 7.5 (H) <5.7 % of total Hgb   Mean Plasma Glucose 169 (calc)   eAG (mmol/L) 9.3 (calc)        Assessment & Plan:   Problem List Items Addressed This Visit    Alcohol dependence in remission ([place])   Benign prostatic hyperplasia with urinary obstruction    Improved BPH LUTS on Saw palmetto Now off tamsulosin Followed by Dr Bernardo Heater      Relevant Medications   saw palmetto 160 MG capsule   DM (diabetes mellitus), type 2 with neurological complications (HCC)    Dramatically improved A1 from >12 to 7.5 on diet change, GLP1 Complications - peripheral neuropathy, other including hyperlipidemia, GERD, depression - increases risk of future cardiovascular complications poor glucose control due to reduced lifestyle diet/exercise with low energy mood and fatigue - Declined endo referral previously - Failed Victoza. Remain off Glimepiride  Plan:  1. INCREASE dose for Ozepmic up to 1mg  weekly injection from 0.5 - CONTINUE Metformin 1000mg  BID - REDUCE Insulin NPH Relion 10u down to 5u BID - in near future within 1-2 weeks may taper off or DC completely, pending CBG trend, caution hypoglycemia on insulin and GLP1 2. Encourage improved lifestyle - low carb, low sugar diet, reduce portion size,  continue improving regular exercise 3. Check CBG, bring log to next visit for review 4. Continue ASA, ACEi, Statin therapy Follow-up 6 months      Relevant Medications   benazepril (LOTENSIN) 40 MG tablet   glucose blood (BAYER CONTOUR TEST) test strip   Semaglutide, 1 MG/DOSE, (OZEMPIC, 1 MG/DOSE,) 2 MG/1.5ML SOPN   Essential hypertension    Controlled HTN No known complications    Plan:  1. Continue current BP regimen Benazepril 40mg  daily - refilled 2. Encourage improved lifestyle - low sodium diet, regular exercise 3. Continue monitor BP outside office, bring readings to next visit, if persistently >140/90 or new symptoms notify office sooner 4. Follow-up 6 months      Relevant Medications   benazepril (LOTENSIN) 40 MG tablet   Hyperlipidemia associated with type 2 diabetes mellitus  (HCC)    Improved lower LDL Last lipid panel 05/2018 Calculated ASCVD 10 yr risk score 28.4% and DM Prior statin failed Simvastatin (stopped due to LFTs, however chart review shows unlikely d/t statin since still elevated >1 year off statin, likely from prior alcohol history)  Plan: 1. Reconsider Statin 2. Continue ASA 81mg  for primary ASCVD risk reduction 3. Encourage improved lifestyle - low carb/cholesterol, reduce portion size, continue improving regular exercise      Relevant Medications   benazepril (LOTENSIN) 40 MG tablet   Semaglutide, 1 MG/DOSE, (OZEMPIC, 1 MG/DOSE,) 2 MG/1.5ML SOPN   Major depression, recurrent, chronic (HCC)    Controlled mood on SSRI and Trazodone Insomnia improved as well on current dose Follow-up as planned      Relevant Medications   traZODone (DESYREL) 50 MG tablet    Other Visit Diagnoses    Annual physical exam    -  Primary Updated Health Maintenance information Reviewed recent lab results with patient Encouraged improvement to lifestyle with diet and exercise    Needs flu shot       Relevant Orders   Flu Vaccine QUAD 36+ mos IM (Completed)   Tinea versicolor     Refractory after trial topical in past Trial on oral therapy now    Relevant Medications   fluconazole (DIFLUCAN) 150 MG tablet      Meds ordered this encounter  Medications  . benazepril (LOTENSIN) 40 MG tablet    Sig: Take 1 tablet (40 mg total) by mouth daily at 6 (six) AM.    Dispense:  90 tablet    Refill:  3  . traZODone (DESYREL) 50 MG tablet    Sig: Take 1.5 tablets (75 mg total) by mouth at bedtime.    Dispense:  135 tablet    Refill:  3    90 day  . glucose blood (BAYER CONTOUR TEST) test strip    Sig: Use as instructed to check blood sugars twice a day.    Dispense:  100 each    Refill:  12    Bayer Contour test  . Semaglutide, 1 MG/DOSE, (OZEMPIC, 1 MG/DOSE,) 2 MG/1.5ML SOPN    Sig: Inject 1 mg into the skin once a week.    Dispense:  2 pen    Refill:   5    Dose increase from ozempic 0.5 up to 1mg   . fluconazole (DIFLUCAN) 150 MG tablet    Sig: Take 2 tablets (300 mg total) by mouth once a week. For 2 weeks - may repeat course in future if need    Dispense:  4 tablet    Refill:  1  . saw palmetto  160 MG capsule    Sig: Take 1 capsule (160 mg total) by mouth 2 (two) times daily.     Follow up plan: Return in about 6 months (around 12/01/2018) for DM A1c, med adjust.  Nobie Putnam, DO [facility] 06/02/2018, 2:34 PM

## 2018-06-03 NOTE — Assessment & Plan Note (Signed)
Controlled HTN No known complications    Plan:  1. Continue current BP regimen Benazepril 40mg  daily - refilled 2. Encourage improved lifestyle - low sodium diet, regular exercise 3. Continue monitor BP outside office, bring readings to next visit, if persistently >140/90 or new symptoms notify office sooner 4. Follow-up 6 months

## 2018-06-03 NOTE — Assessment & Plan Note (Signed)
Dramatically improved A1 from >12 to 7.5 on diet change, GLP1 Complications - peripheral neuropathy, other including hyperlipidemia, GERD, depression - increases risk of future cardiovascular complications poor glucose control due to reduced lifestyle diet/exercise with low energy mood and fatigue - Declined endo referral previously - Failed Victoza. Remain off Glimepiride  Plan:  1. INCREASE dose for Ozepmic up to 1mg  weekly injection from 0.5 - CONTINUE Metformin 1000mg  BID - REDUCE Insulin NPH Relion 10u down to 5u BID - in near future within 1-2 weeks may taper off or DC completely, pending CBG trend, caution hypoglycemia on insulin and GLP1 2. Encourage improved lifestyle - low carb, low sugar diet, reduce portion size, continue improving regular exercise 3. Check CBG, bring log to next visit for review 4. Continue ASA, ACEi, Statin therapy Follow-up 6 months

## 2018-06-03 NOTE — Assessment & Plan Note (Addendum)
Controlled mood on SSRI and Trazodone Insomnia improved as well on current dose Follow-up as planned

## 2018-06-03 NOTE — Assessment & Plan Note (Signed)
Improved BPH LUTS on Saw palmetto Now off tamsulosin Followed by Dr [name] 

## 2018-06-03 NOTE — Assessment & Plan Note (Signed)
Improved lower LDL Last lipid panel 05/2018 Calculated ASCVD 10 yr risk score 28.4% and DM Prior statin failed Simvastatin (stopped due to LFTs, however chart review shows unlikely d/t statin since still elevated >1 year off statin, likely from prior alcohol history)  Plan: 1. Reconsider Statin 2. Continue ASA 81mg  for primary ASCVD risk reduction 3. Encourage improved lifestyle - low carb/cholesterol, reduce portion size, continue improving regular exercise

## 2018-06-26 DIAGNOSIS — L219 Seborrheic dermatitis, unspecified: Secondary | ICD-10-CM

## 2018-07-10 ENCOUNTER — Other Ambulatory Visit: Payer: Self-pay | Admitting: Family Medicine

## 2018-07-10 DIAGNOSIS — E1149 Type 2 diabetes mellitus with other diabetic neurological complication: Secondary | ICD-10-CM

## 2018-07-16 DIAGNOSIS — E1149 Type 2 diabetes mellitus with other diabetic neurological complication: Secondary | ICD-10-CM

## 2018-07-16 MED ORDER — SEMAGLUTIDE (1 MG/DOSE) 2 MG/1.5ML ~~LOC~~ SOPN: 1.0000 mg | PEN_INJECTOR | SUBCUTANEOUS | 5 refills | Status: DC

## 2018-08-04 DIAGNOSIS — L509 Urticaria, unspecified: Secondary | ICD-10-CM | POA: Diagnosis not present

## 2018-08-04 DIAGNOSIS — B360 Pityriasis versicolor: Secondary | ICD-10-CM | POA: Diagnosis not present

## 2018-08-13 DIAGNOSIS — E113393 Type 2 diabetes mellitus with moderate nonproliferative diabetic retinopathy without macular edema, bilateral: Secondary | ICD-10-CM | POA: Diagnosis not present

## 2018-08-13 LAB — HM DIABETES EYE EXAM

## 2018-08-20 ENCOUNTER — Other Ambulatory Visit: Payer: Self-pay | Admitting: Family Medicine

## 2018-08-20 DIAGNOSIS — E1149 Type 2 diabetes mellitus with other diabetic neurological complication: Secondary | ICD-10-CM

## 2018-09-02 DIAGNOSIS — F339 Major depressive disorder, recurrent, unspecified: Secondary | ICD-10-CM

## 2018-09-02 MED ORDER — TRAZODONE HCL 50 MG PO TABS: 50.0000 mg | ORAL_TABLET | Freq: Every day | ORAL | 3 refills | Status: DC

## 2018-09-02 NOTE — Telephone Encounter (Signed)
Mychart message

## 2018-09-22 DIAGNOSIS — L509 Urticaria, unspecified: Secondary | ICD-10-CM | POA: Diagnosis not present

## 2018-09-22 DIAGNOSIS — B360 Pityriasis versicolor: Secondary | ICD-10-CM | POA: Diagnosis not present

## 2018-11-27 ENCOUNTER — Other Ambulatory Visit: Payer: Self-pay | Admitting: Family Medicine

## 2018-11-27 DIAGNOSIS — E1149 Type 2 diabetes mellitus with other diabetic neurological complication: Secondary | ICD-10-CM

## 2018-11-28 ENCOUNTER — Other Ambulatory Visit: Payer: BLUE CROSS/BLUE SHIELD

## 2018-11-28 ENCOUNTER — Other Ambulatory Visit: Payer: Self-pay

## 2018-11-28 DIAGNOSIS — E1149 Type 2 diabetes mellitus with other diabetic neurological complication: Secondary | ICD-10-CM

## 2018-11-29 LAB — HEMOGLOBIN A1C
Hgb A1c MFr Bld: 5.6 % of total Hgb (ref <5.7)
Mean Plasma Glucose: 114 (calc)
eAG (mmol/L): 6.3 (calc)

## 2018-12-03 ENCOUNTER — Other Ambulatory Visit: Payer: Self-pay | Admitting: Family Medicine

## 2018-12-03 ENCOUNTER — Ambulatory Visit (INDEPENDENT_AMBULATORY_CARE_PROVIDER_SITE_OTHER): Payer: BLUE CROSS/BLUE SHIELD | Admitting: Family Medicine

## 2018-12-03 ENCOUNTER — Other Ambulatory Visit: Payer: Self-pay

## 2018-12-03 ENCOUNTER — Encounter: Payer: Self-pay | Admitting: Family Medicine

## 2018-12-03 DIAGNOSIS — E1149 Type 2 diabetes mellitus with other diabetic neurological complication: Secondary | ICD-10-CM

## 2018-12-03 DIAGNOSIS — F339 Major depressive disorder, recurrent, unspecified: Secondary | ICD-10-CM

## 2018-12-03 DIAGNOSIS — N138 Other obstructive and reflux uropathy: Secondary | ICD-10-CM

## 2018-12-03 DIAGNOSIS — Z0000 Encounter for general adult medical examination without abnormal findings: Secondary | ICD-10-CM

## 2018-12-03 DIAGNOSIS — E1169 Type 2 diabetes mellitus with other specified complication: Secondary | ICD-10-CM

## 2018-12-03 DIAGNOSIS — I10 Essential (primary) hypertension: Secondary | ICD-10-CM

## 2018-12-03 NOTE — Assessment & Plan Note (Signed)
Continued dramatic improve Diabetes with A1c from 12 > 7 > now to 5.6, on diet changes and higher dose GLP1 Complications - peripheral neuropathy, other including hyperlipidemia, GERD, depression - increases risk of future cardiovascular complications poor glucose control due to reduced lifestyle diet/exercise with low energy mood and fatigue - Failed Victoza. OFF Glimepiride and NPH Insulin  Plan:  1. CONTINUE dose for Ozepmic up to 1mg  weekly injection - CONTINUE Metformin 1000mg  BID 2. Encourage improved lifestyle - low carb, low sugar diet, reduce portion size, continue improving regular exercise 3. Check CBG, bring log to next visit for review 4. Continue ASA, ACEi, Statin therapy Follow-up 6 months  Future can consider taper down on Metformin if remains A1c < 6 in future and patient ready to taper down, he may test this on his own before, notify by mychart if need new rx adjusted

## 2018-12-03 NOTE — Patient Instructions (Addendum)
Thank you for coming to the office today.  Recent Labs    02/26/18 0849 05/26/18 0758 11/28/18 0819  HGBA1C 12.7* 7.5* 5.6    Keep up the great work!  DUE for FASTING BLOOD WORK (no food or drink after midnight before the lab appointment, only water or coffee without cream/sugar on the morning of)  SCHEDULE "Lab Only" visit in the morning at the clinic for lab draw in 6 MONTHS   - Make sure Lab Only appointment is at about 1 week before your next appointment, so that results will be available  For Lab Results, once available within 2-3 days of blood draw, you can can log in to MyChart online to view your results and a brief explanation. Also, we can discuss results at next follow-up visit.   Please schedule a Follow-up Appointment to: Return in about 6 months (around 06/05/2019) for Annual Physical.  If you have any other questions or concerns, please feel free to call the office or send a message through [place]. You may also schedule an earlier appointment if necessary.  Additionally, you may be receiving a survey about your experience at our office within a few days to 1 week by e-mail or mail. We value your feedback.  Nobie Putnam, DO [place]

## 2018-12-03 NOTE — Progress Notes (Signed)
Virtual Visit via Telephone The purpose of this virtual visit is to provide medical care while limiting exposure to the novel coronavirus (COVID19) for both patient and office staff.  Consent was obtained for phone visit:  Yes.   Answered questions that patient had about telehealth interaction:  Yes.   I discussed the limitations, risks, security and privacy concerns of performing an evaluation and management service by telephone. I also discussed with the patient that there may be a patient responsible charge related to this service. The patient expressed understanding and agreed to proceed.  Patient Location: Home Provider Location: [facility])  ---------------------------------------------------------------------- Chief Complaint  Patient presents with  . Diabetes    S: Reviewed CMA documentation. I have called patient and gathered additional HPI as follows:  CHRONIC DM, Type 2 with DM Neuropathy: Last visit 05/2018, for diabetes, his ozempic was increased from 0.5 up to 1mg  at that time, see prior note for background. - Interval update with overall improved lifestyle. He was able to completely discontinue the NPH Insulin about 2-3 weeks after last apt in 05/2018. - Last lab A1c 5.6 (11/2018) - Today reports he continues to do very well at this time, he is very pleased with his blood sugars and has been controlling this with med and lifestyle CBGs:Checks CBGs 1-3x daily Meds: - Ozempic 1mg  weekly - Metformin 1000mg  BID - Failed Victoza in past, OFF Glimepiride, OFF NPH Insulin Reports good compliance. Tolerating well w/o side-effects Currently on ACEi Lifestyle: - Diet (Significantly improved overhaul diet now with more knowledge of carbohydrates and DM diet, he is following very well and plans to sustain this) Chronic issue with known, Bilateral feet, mild loss of sensitivity Taking Vitamin B12 Magnesium, MVI, Melatonin. Admits tingling and neuropathy in  feet bilateral. Takes OTC medicine for neuropathy Denies hypoglycemia, polyuria, visual changes or tingling.  Denies any high risk travel to areas of current concern for COVID19. Denies any known or suspected exposure to person with or possibly with COVID19.  Denies any fevers, chills, sweats, body ache, cough, shortness of breath, sinus pain or pressure, headache, abdominal pain, diarrhea  Past Medical History:  Diagnosis Date  . Anxiety   . Cholelithiasis   . GERD (gastroesophageal reflux disease)   . Hyperlipidemia    Social History   Tobacco Use  . Smoking status: Former Smoker    Years: 10.00    Last attempt to quit: 08/08/1999    Years since quitting: 19.3  . Smokeless tobacco: Former Network engineer Use Topics  . Alcohol use: No    Alcohol/week: 0.0 standard drinks    Comment: Has requested Antabuse at previous PCP visit.   . Drug use: No    Current Outpatient Medications:  .  allopurinol (ZYLOPRIM) 300 MG tablet, Take 1 tablet (300 mg total) by mouth daily., Disp: 30 tablet, Rfl: 11 .  aspirin 81 MG tablet, Take 1 tablet by mouth daily at 6 (six) AM., Disp: , Rfl:  .  benazepril (LOTENSIN) 40 MG tablet, Take 1 tablet (40 mg total) by mouth daily at 6 (six) AM., Disp: 90 tablet, Rfl: 3 .  Calcium Acetate, Phos Binder, (CALCIUM ACETATE PO), Take by mouth., Disp: , Rfl:  .  glucose blood (BAYER CONTOUR TEST) test strip, Use as instructed to check blood sugars twice a day., Disp: 100 each, Rfl: 12 .  Magnesium Gluconate 500 (27 MG) MG TABS, Take by mouth., Disp: , Rfl:  .  Melatonin 10 MG TABS, ,  Disp: , Rfl:  .  metFORMIN (GLUCOPHAGE) 1000 MG tablet, TAKE 1 TABLET BY MOUTH TWICE DAILY WITH MEALS, Disp: 180 tablet, Rfl: 3 .  MULTIPLE VITAMIN PO, Take by mouth., Disp: , Rfl:  .  saw palmetto 160 MG capsule, Take 1 capsule (160 mg total) by mouth 2 (two) times daily., Disp: , Rfl:  .  Semaglutide, 1 MG/DOSE, (OZEMPIC, 1 MG/DOSE,) 2 MG/1.5ML SOPN, Inject 1 mg into the skin  once a week., Disp: 2 pen, Rfl: 5 .  sildenafil (REVATIO) 20 MG tablet, Take 3-5 tablets daily by mouth as needed, Disp: , Rfl:  .  traZODone (DESYREL) 50 MG tablet, Take 1 tablet (50 mg total) by mouth at bedtime., Disp: 90 tablet, Rfl: 3 .  vitamin B-12 (CYANOCOBALAMIN) 1000 MCG tablet, Take by mouth., Disp: , Rfl:  .  Insulin Syringe-Needle U-100 (INSULIN SYRINGE .5CC/30GX1/2") 30G X 1/2" 0.5 ML MISC, 1 each by Does not apply route 2 (two) times daily., Disp: 100 each, Rfl: 5 .  ketoconazole (NIZORAL) 2 % shampoo, Apply 1 application topically 2 (two) times a week. As needed for scalp dermatitis, Disp: 120 mL, Rfl: 0 .  triamcinolone cream (KENALOG) 0.5 %, Apply 1 application topically 2 (two) times daily. To affected areas, for up to 2 weeks., Disp: 30 g, Rfl: 0  Depression screen [facility] 2/9 12/03/2018 06/02/2018 02/26/2018  Decreased Interest 1 0 0  Down, Depressed, Hopeless 0 0 0  PHQ - 2 Score 1 0 0  Altered sleeping 1 0 0  Tired, decreased energy 0 0 1  Change in appetite 0 0 1  Feeling bad or failure about yourself  0 0 0  Trouble concentrating 0 0 1  Moving slowly or fidgety/restless 0 0 0  Suicidal thoughts 0 0 0  PHQ-9 Score 2 0 3  Difficult doing work/chores Somewhat difficult Not difficult at all Not difficult at all    GAD 7 : Generalized Anxiety Score 02/26/2018 11/25/2017  Nervous, Anxious, on Edge 0 3  Control/stop worrying 0 3  Worry too much - different things 0 3  Trouble relaxing 0 2  Restless 0 1  Easily annoyed or irritable 0 1  Afraid - awful might happen 0 2  Total GAD 7 Score 0 15  Anxiety Difficulty Not difficult at all Very difficult    -------------------------------------------------------------------------- O: No physical exam performed due to remote telephone encounter.  Lab results reviewed.  Recent Labs    02/26/18 0849 05/26/18 0758 11/28/18 0819  HGBA1C 12.7* 7.5* 5.6     Recent Results (from the past 2160 hour(s))  Hemoglobin A1c      Status: None   Collection Time: 11/28/18  8:19 AM  Result Value Ref Range   Hgb A1c MFr Bld 5.6 <5.7 % of total Hgb    Comment: For the purpose of screening for the presence of diabetes: . <5.7%       Consistent with the absence of diabetes 5.7-6.4%    Consistent with increased risk for diabetes             (prediabetes) > or =6.5%  Consistent with diabetes . This assay result is consistent with a decreased risk of diabetes. . Currently, no consensus exists regarding use of hemoglobin A1c for diagnosis of diabetes in children. . According to American Diabetes Association (ADA) guidelines, hemoglobin A1c <7.0% represents optimal control in non-pregnant diabetic patients. Different metrics may apply to specific patient populations.  Standards of Medical Care in Diabetes(ADA). Marland Kitchen  Mean Plasma Glucose 114 (calc)   eAG (mmol/L) 6.3 (calc)    -------------------------------------------------------------------------- A&P:  Problem List Items Addressed This Visit    DM (diabetes mellitus), type 2 with neurological complications ([place]) - Primary    Continued dramatic improve Diabetes with A1c from 12 > 7 > now to 5.6, on diet changes and higher dose GLP1 Complications - peripheral neuropathy, other including hyperlipidemia, GERD, depression - increases risk of future cardiovascular complications poor glucose control due to reduced lifestyle diet/exercise with low energy mood and fatigue - Failed Victoza. OFF Glimepiride and NPH Insulin  Plan:  1. CONTINUE dose for Ozepmic up to 1mg  weekly injection - CONTINUE Metformin 1000mg  BID 2. Encourage improved lifestyle - low carb, low sugar diet, reduce portion size, continue improving regular exercise 3. Check CBG, bring log to next visit for review 4. Continue ASA, ACEi, Statin therapy Follow-up 6 months  Future can consider taper down on Metformin if remains A1c < 6 in future and patient ready to taper down, he may test this on his  own before, notify by mychart if need new rx adjusted         No orders of the defined types were placed in this encounter.   Follow-up: - Return in 6 months for Annual Physical - Future labs ordered for 06/02/19  Patient verbalizes understanding with the above medical recommendations including the limitation of remote medical advice.  Specific follow-up and call-back criteria were given for patient to follow-up or seek medical care more urgently if needed.   - Time spent in direct consultation with patient on phone: 9 minutes   Nobie Putnam, [facility] 12/03/2018, 1:23 PM

## 2018-12-26 ENCOUNTER — Other Ambulatory Visit: Payer: Self-pay | Admitting: Family Medicine

## 2018-12-26 DIAGNOSIS — E1149 Type 2 diabetes mellitus with other diabetic neurological complication: Secondary | ICD-10-CM

## 2019-03-29 DIAGNOSIS — F339 Major depressive disorder, recurrent, unspecified: Secondary | ICD-10-CM

## 2019-03-30 MED ORDER — TRAZODONE HCL 50 MG PO TABS: 75.0000 mg | ORAL_TABLET | Freq: Every day | ORAL | 1 refills | Status: DC

## 2019-05-04 ENCOUNTER — Telehealth: Payer: Self-pay | Admitting: Urology

## 2019-05-04 NOTE — Telephone Encounter (Signed)
He will need an appointment for refills it has been over a year since he was seen thanks

## 2019-05-04 NOTE — Telephone Encounter (Signed)
Pt.called requesting refill for three medications however I only see Allopurinol was prescribed by Dr.[name]. When I mentioned this to the pt. He stated the other two medications were filled by Dr. Jacqlyn Larsen and when he was in office with Dr. Bernardo Heater 12/2017 he was told to call the office when he needed a refill for all three medications. Pt. Would like refills for all three medications to be 90 day supply sent to [facility] on [address].  If pt. Needs an appointment before he can get a refill please advise.

## 2019-05-05 NOTE — Telephone Encounter (Signed)
Once his appointment is made I will send in enough medication to last until that appointment.

## 2019-05-08 ENCOUNTER — Other Ambulatory Visit: Payer: Self-pay

## 2019-05-08 DIAGNOSIS — N5201 Erectile dysfunction due to arterial insufficiency: Secondary | ICD-10-CM

## 2019-05-08 DIAGNOSIS — N401 Enlarged prostate with lower urinary tract symptoms: Secondary | ICD-10-CM

## 2019-05-08 MED ORDER — TAMSULOSIN HCL 0.4 MG PO CAPS: 0.4000 mg | ORAL_CAPSULE | Freq: Every day | ORAL | 0 refills | Status: DC

## 2019-05-08 MED ORDER — SILDENAFIL CITRATE 20 MG PO TABS: ORAL_TABLET | ORAL | 0 refills | Status: DC

## 2019-05-08 MED ORDER — ALLOPURINOL 300 MG PO TABS: 300.0000 mg | ORAL_TABLET | Freq: Every day | ORAL | 0 refills | Status: DC

## 2019-05-08 NOTE — Telephone Encounter (Signed)
Short term rx sent in for requested medications as pt has scheduled an appointment.

## 2019-05-22 ENCOUNTER — Ambulatory Visit: Payer: Self-pay | Admitting: Urology

## 2019-06-02 ENCOUNTER — Other Ambulatory Visit: Payer: Self-pay

## 2019-06-02 DIAGNOSIS — Z0000 Encounter for general adult medical examination without abnormal findings: Secondary | ICD-10-CM

## 2019-06-02 DIAGNOSIS — N138 Other obstructive and reflux uropathy: Secondary | ICD-10-CM

## 2019-06-02 DIAGNOSIS — I10 Essential (primary) hypertension: Secondary | ICD-10-CM | POA: Diagnosis not present

## 2019-06-02 DIAGNOSIS — E1149 Type 2 diabetes mellitus with other diabetic neurological complication: Secondary | ICD-10-CM

## 2019-06-02 DIAGNOSIS — E785 Hyperlipidemia, unspecified: Secondary | ICD-10-CM | POA: Diagnosis not present

## 2019-06-02 DIAGNOSIS — E1169 Type 2 diabetes mellitus with other specified complication: Secondary | ICD-10-CM | POA: Diagnosis not present

## 2019-06-02 DIAGNOSIS — N401 Enlarged prostate with lower urinary tract symptoms: Secondary | ICD-10-CM | POA: Diagnosis not present

## 2019-06-02 DIAGNOSIS — F339 Major depressive disorder, recurrent, unspecified: Secondary | ICD-10-CM

## 2019-06-03 LAB — CBC WITH DIFFERENTIAL/PLATELET
Absolute Monocytes: 419 cells/uL (ref 200–950)
Basophils Absolute: 60 cells/uL (ref 0–200)
Basophils Relative: 1.3 %
Eosinophils Absolute: 350 cells/uL (ref 15–500)
Eosinophils Relative: 7.6 %
HCT: 41.8 % (ref 38.5–50.0)
Hemoglobin: 14.7 g/dL (ref 13.2–17.1)
Lymphs Abs: 1159 cells/uL (ref 850–3900)
MCH: 35.3 pg — ABNORMAL HIGH (ref 27.0–33.0)
MCHC: 35.2 g/dL (ref 32.0–36.0)
MCV: 100.2 fL — ABNORMAL HIGH (ref 80.0–100.0)
MPV: 9.9 fL (ref 7.5–12.5)
Monocytes Relative: 9.1 %
Neutro Abs: 2613 cells/uL (ref 1500–7800)
Neutrophils Relative %: 56.8 %
Platelets: 154 10*3/uL (ref 140–400)
RBC: 4.17 10*6/uL — ABNORMAL LOW (ref 4.20–5.80)
RDW: 12.6 % (ref 11.0–15.0)
Total Lymphocyte: 25.2 %
WBC: 4.6 10*3/uL (ref 3.8–10.8)

## 2019-06-03 LAB — COMPLETE METABOLIC PANEL WITH GFR
AG Ratio: 1.5 (calc) (ref 1.0–2.5)
ALT: 79 U/L — ABNORMAL HIGH (ref 9–46)
AST: 109 U/L — ABNORMAL HIGH (ref 10–35)
Albumin: 4.3 g/dL (ref 3.6–5.1)
Alkaline phosphatase (APISO): 55 U/L (ref 35–144)
BUN/Creatinine Ratio: 7 (calc) (ref 6–22)
BUN: 6 mg/dL — ABNORMAL LOW (ref 7–25)
CO2: 27 mmol/L (ref 20–32)
Calcium: 9.8 mg/dL (ref 8.6–10.3)
Chloride: 96 mmol/L — ABNORMAL LOW (ref 98–110)
Creat: 0.92 mg/dL (ref 0.70–1.25)
GFR, Est African American: 102 mL/min/{1.73_m2} (ref >=60)
GFR, Est Non African American: 88 mL/min/{1.73_m2} (ref >=60)
Globulin: 2.8 g/dL (calc) (ref 1.9–3.7)
Glucose, Bld: 144 mg/dL — ABNORMAL HIGH (ref 65–99)
Potassium: 5 mmol/L (ref 3.5–5.3)
Sodium: 134 mmol/L — ABNORMAL LOW (ref 135–146)
Total Bilirubin: 0.9 mg/dL (ref 0.2–1.2)
Total Protein: 7.1 g/dL (ref 6.1–8.1)

## 2019-06-03 LAB — LIPID PANEL
Cholesterol: 217 mg/dL — ABNORMAL HIGH (ref <200)
HDL: 41 mg/dL (ref >=40)
LDL Cholesterol (Calc): 133 mg/dL (calc) — ABNORMAL HIGH
Non-HDL Cholesterol (Calc): 176 mg/dL (calc) — ABNORMAL HIGH (ref <130)
Total CHOL/HDL Ratio: 5.3 (calc) — ABNORMAL HIGH (ref <5.0)
Triglycerides: 303 mg/dL — ABNORMAL HIGH (ref <150)

## 2019-06-03 LAB — HEMOGLOBIN A1C
Hgb A1c MFr Bld: 5.8 % of total Hgb — ABNORMAL HIGH (ref <5.7)
Mean Plasma Glucose: 120 (calc)
eAG (mmol/L): 6.6 (calc)

## 2019-06-03 LAB — TSH: TSH: 1.78 mIU/L (ref 0.40–4.50)

## 2019-06-03 LAB — PSA: PSA: 1 ng/mL (ref <=4.0)

## 2019-06-10 ENCOUNTER — Other Ambulatory Visit: Payer: Self-pay | Admitting: Family Medicine

## 2019-06-10 ENCOUNTER — Ambulatory Visit (INDEPENDENT_AMBULATORY_CARE_PROVIDER_SITE_OTHER): Payer: BC Managed Care – PPO | Admitting: Family Medicine

## 2019-06-10 ENCOUNTER — Other Ambulatory Visit: Payer: Self-pay

## 2019-06-10 ENCOUNTER — Encounter: Payer: Self-pay | Admitting: Family Medicine

## 2019-06-10 VITALS — BP 134/64 | HR 87 | Temp 97.5°F | Resp 16 | Ht 71.0 in | Wt 198.0 lb

## 2019-06-10 DIAGNOSIS — F1021 Alcohol dependence, in remission: Secondary | ICD-10-CM

## 2019-06-10 DIAGNOSIS — E785 Hyperlipidemia, unspecified: Secondary | ICD-10-CM

## 2019-06-10 DIAGNOSIS — E1149 Type 2 diabetes mellitus with other diabetic neurological complication: Secondary | ICD-10-CM

## 2019-06-10 DIAGNOSIS — Z0000 Encounter for general adult medical examination without abnormal findings: Secondary | ICD-10-CM

## 2019-06-10 DIAGNOSIS — E1169 Type 2 diabetes mellitus with other specified complication: Secondary | ICD-10-CM | POA: Diagnosis not present

## 2019-06-10 DIAGNOSIS — I10 Essential (primary) hypertension: Secondary | ICD-10-CM

## 2019-06-10 DIAGNOSIS — E663 Overweight: Secondary | ICD-10-CM | POA: Insufficient documentation

## 2019-06-10 DIAGNOSIS — Z23 Encounter for immunization: Secondary | ICD-10-CM | POA: Diagnosis not present

## 2019-06-10 DIAGNOSIS — F339 Major depressive disorder, recurrent, unspecified: Secondary | ICD-10-CM

## 2019-06-10 MED ORDER — METFORMIN HCL 1000 MG PO TABS: 1000.0000 mg | ORAL_TABLET | Freq: Two times a day (BID) | ORAL | 3 refills | Status: DC

## 2019-06-10 MED ORDER — BENAZEPRIL HCL 40 MG PO TABS: 40.0000 mg | ORAL_TABLET | Freq: Every day | ORAL | 3 refills | Status: DC

## 2019-06-10 NOTE — Assessment & Plan Note (Signed)
Encourage lifestyle weight management

## 2019-06-10 NOTE — Assessment & Plan Note (Signed)
Controlled HTN No known complications     Plan:  1. Continue current BP regimen Benazepril 40mg  daily - refilled 2. Encourage improved lifestyle - low sodium diet, regular exercise 3. Continue monitor BP outside office, bring readings to next visit, if persistently >140/90 or new symptoms notify office sooner 4. Follow-up 6 months

## 2019-06-10 NOTE — Assessment & Plan Note (Addendum)
Stable, without any recurrence of alcohol intake

## 2019-06-10 NOTE — Progress Notes (Signed)
Subjective:    Patient ID: Richard Columbia Sr., male    DOB: 01-14-1956, 63 y.o.   MRN: [id number]  Richard RAVENS Sr. is a 63 y.o. male presenting on 06/10/2019 for Annual Exam   HPI   Here for Annual Physical and Lab Review  CHRONIC DM, Type 2 with DM Neuropathy / Overweight BMI >27 Last visit 11/2018, see prior note for background. - Last lab A1c up to 5.8 (05/2019) from 5.6 - Today reports he continues to do very well at this time, he is very pleased with his blood sugars and has been controlling this with med and lifestyle CBGs:Checks CBGs 1-3x daily Meds: - Ozempic 1mg  weekly -Metformin 1000mg  BID - Failed Victoza in past, OFF Glimepiride, OFF NPH Insulin Reports good compliance. Tolerating well w/o side-effects Currently on ACEi Lifestyle: - Diet (improved diet overall still) - Exercise (less walking dog or active lately) Chronic issue with known, Bilateral feet, mild loss of sensitivity Taking Vitamin B12 Magnesium, MVI, Melatonin. Admits tingling and neuropathy in feet bilateral. Takes OTC medicine for neuropathy. Still has cramping pain in L foot episodic. He has discoloration of great toenails with thickening - Last DM Eye exam by Dr Gloriann Loan (2020 - will request record) Denies hypoglycemia, polyuria, visual changes or tingling.  BPH Followed by Dr Gemma Payor Has apt tomorrow. He is still on [employer] and also Flomax. History of ED as well  Major Depression, recurrent / Insomnia Reports his mood has been good overall without new concerns. Continued on medication. He has improved on Trazodone 1.5 pills for 75mg  nightly for insomnia. - Considered SSRI but he declines today to start any new med.   Health Maintenance: Due for Flu Shot, will receive today   Last colonoscopy approx 2008. He has declined in interval due to ins coverage and other concerns. Now he plans to change insurance - may consider Cologuard testing now once switch and check cost. - He  checked into it and cost was >$600. Declines to proceed.     Depression screen [facility] 2/9 06/10/2019 12/03/2018 06/02/2018  Decreased Interest 1 1 0  Down, Depressed, Hopeless 1 0 0  PHQ - 2 Score 2 1 0  Altered sleeping 0 1 0  Tired, decreased energy 0 0 0  Change in appetite 0 0 0  Feeling bad or failure about yourself  1 0 0  Trouble concentrating 0 0 0  Moving slowly or fidgety/restless 0 0 0  Suicidal thoughts 0 0 0  PHQ-9 Score 3 2 0  Difficult doing work/chores Not difficult at all Somewhat difficult Not difficult at all    GAD 7 : Generalized Anxiety Score 02/26/2018 11/25/2017  Nervous, Anxious, on Edge 0 3  Control/stop worrying 0 3  Worry too much - different things 0 3  Trouble relaxing 0 2  Restless 0 1  Easily annoyed or irritable 0 1  Afraid - awful might happen 0 2  Total GAD 7 Score 0 15  Anxiety Difficulty Not difficult at all Very difficult      Past Medical History:  Diagnosis Date  . Anxiety   . Cholelithiasis   . GERD (gastroesophageal reflux disease)   . Hyperlipidemia    Past Surgical History:  Procedure Laterality Date  . Lung Collapse  63 years old.    Social History   Socioeconomic History  . Marital status: Married    Spouse name: Not on file  . Number of children: Not on file  .  Years of education: Not on file  . Highest education level: Not on file  Occupational History  . Not on file  Social Needs  . Financial resource strain: Not on file  . Food insecurity    Worry: Not on file    Inability: Not on file  . Transportation needs    Medical: Not on file    Non-medical: Not on file  Tobacco Use  . Smoking status: Former Smoker    Years: 10.00    Quit date: 08/08/1999    Years since quitting: 19.8  . Smokeless tobacco: Former Network engineer and Sexual Activity  . Alcohol use: No    Alcohol/week: 0.0 standard drinks    Comment: Has requested Antabuse at previous PCP visit.   . Drug use: No  . Sexual activity: Yes  Lifestyle   . Physical activity    Days per week: Not on file    Minutes per session: Not on file  . Stress: Not on file  Relationships  . Social Herbalist on phone: Not on file    Gets together: Not on file    Attends religious service: Not on file    Active member of club or organization: Not on file    Attends meetings of clubs or organizations: Not on file    Relationship status: Not on file  . Intimate partner violence    Fear of current or ex partner: Not on file    Emotionally abused: Not on file    Physically abused: Not on file    Forced sexual activity: Not on file  Other Topics Concern  . Not on file  Social History Narrative  . Not on file   Family History  Problem Relation Age of Onset  . Diabetes Father        complications of DM caused death  . Heart attack Father   . Diabetes Sister   . Cancer Mother        lung  . Lung cancer Mother    Current Outpatient Medications on File Prior to Visit  Medication Sig  . allopurinol (ZYLOPRIM) 300 MG tablet Take 1 tablet (300 mg total) by mouth daily.  Marland Kitchen aspirin 81 MG tablet Take 1 tablet by mouth daily at 6 (six) AM.  . Calcium Acetate, Phos Binder, (CALCIUM ACETATE PO) Take by mouth.  Marland Kitchen glucose blood (BAYER CONTOUR TEST) test strip Use as instructed to check blood sugars twice a day.  . ketoconazole (NIZORAL) 2 % shampoo Apply 1 application topically 2 (two) times a week. As needed for scalp dermatitis  . Magnesium Gluconate 500 (27 MG) MG TABS Take by mouth.  . Melatonin 10 MG TABS   . MULTIPLE VITAMIN PO Take by mouth.  . saw palmetto 160 MG capsule Take 1 capsule (160 mg total) by mouth 2 (two) times daily.  . sildenafil (REVATIO) 20 MG tablet Take 3-5 tablets daily by mouth as needed  . tamsulosin (FLOMAX) 0.4 MG CAPS capsule Take 1 capsule (0.4 mg total) by mouth daily.  . traZODone (DESYREL) 50 MG tablet Take 1.5 tablets (75 mg total) by mouth at bedtime.  . triamcinolone cream (KENALOG) 0.5 % Apply 1  application topically 2 (two) times daily. To affected areas, for up to 2 weeks.  . vitamin B-12 (CYANOCOBALAMIN) 1000 MCG tablet Take by mouth.   No current facility-administered medications on file prior to visit.     Review of Systems  Constitutional: Negative for  activity change, appetite change, chills, diaphoresis, fatigue and fever.  HENT: Negative for congestion and hearing loss.   Eyes: Negative for visual disturbance.  Respiratory: Negative for apnea, cough, chest tightness, shortness of breath and wheezing.   Cardiovascular: Negative for chest pain, palpitations and leg swelling.  Gastrointestinal: Negative for abdominal pain, anal bleeding, blood in stool, constipation, diarrhea, nausea and vomiting.  Endocrine: Negative for cold intolerance.  Genitourinary: Negative for difficulty urinating, dysuria, frequency and hematuria.  Musculoskeletal: Negative for arthralgias, back pain and neck pain.  Skin: Negative for rash.  Allergic/Immunologic: Negative for environmental allergies.  Neurological: Negative for dizziness, weakness, light-headedness, numbness and headaches.  Hematological: Negative for adenopathy.  Psychiatric/Behavioral: Negative for behavioral problems, dysphoric mood and sleep disturbance. The patient is not nervous/anxious.    Per HPI unless specifically indicated above      Objective:    BP 134/64 (BP Location: Left Arm, Cuff Size: Normal)   Pulse 87   Temp (!) 97.5 F (36.4 C) (Oral)   Resp 16   Ht 5\' 11"  (1.803 m)   Wt 198 lb (89.8 kg)   BMI 27.62 kg/m   Wt Readings from Last 3 Encounters:  06/10/19 198 lb (89.8 kg)  06/02/18 195 lb 12.8 oz (88.8 kg)  02/26/18 196 lb 12.8 oz (89.3 kg)    Physical Exam Vitals signs and nursing note reviewed.  Constitutional:      General: He is not in acute distress.    Appearance: He is well-developed. He is not diaphoretic.     Comments: Well-appearing, comfortable, cooperative  HENT:     Head:  Normocephalic and atraumatic.  Eyes:     General:        Right eye: No discharge.        Left eye: No discharge.     Conjunctiva/sclera: Conjunctivae normal.     Pupils: Pupils are equal, round, and reactive to light.  Neck:     Musculoskeletal: Normal range of motion and neck supple.     Thyroid: No thyromegaly.  Cardiovascular:     Rate and Rhythm: Normal rate and regular rhythm.     Heart sounds: Normal heart sounds. No murmur.  Pulmonary:     Effort: Pulmonary effort is normal. No respiratory distress.     Breath sounds: Normal breath sounds. No wheezing or rales.  Abdominal:     General: Bowel sounds are normal. There is no distension.     Palpations: Abdomen is soft. There is no mass.     Tenderness: There is no abdominal tenderness.  Musculoskeletal: Normal range of motion.        General: No tenderness.     Comments: Upper / Lower Extremities: - Normal muscle tone, strength bilateral upper extremities 5/5, lower extremities 5/5  Lymphadenopathy:     Cervical: No cervical adenopathy.  Skin:    General: Skin is warm and dry.     Findings: No erythema or rash.  Neurological:     Mental Status: He is alert and oriented to person, place, and time.     Comments: Distal sensation intact to light touch all extremities  Psychiatric:        Behavior: Behavior normal.     Comments: Well groomed, good eye contact, normal speech and thoughts      Diabetic Foot Exam - Simple   Simple Foot Form Diabetic Foot exam was performed with the following findings: Yes 06/10/2019  8:54 AM  Visual Inspection See comments: Yes Sensation Testing  Intact to touch and monofilament testing bilaterally: Yes Pulse Check Posterior Tibialis and Dorsalis pulse intact bilaterally: Yes Comments Bilateral great toenail thickening and discoloration and some dry skin with callus formation but without ulcer.    Recent Labs    11/28/18 0819 06/02/19 0822  HGBA1C 5.6 5.8*     Results for orders  placed or performed in visit on 06/02/19  TSH  Result Value Ref Range   TSH 1.78 0.40 - 4.50 mIU/L  PSA  Result Value Ref Range   PSA 1.0 < OR = 4.0 ng/mL  Lipid panel  Result Value Ref Range   Cholesterol 217 (H) <200 mg/dL   HDL 41 > OR = 40 mg/dL   Triglycerides 303 (H) <150 mg/dL   LDL Cholesterol (Calc) 133 (H) mg/dL (calc)   Total CHOL/HDL Ratio 5.3 (H) <5.0 (calc)   Non-HDL Cholesterol (Calc) 176 (H) <130 mg/dL (calc)  COMPLETE METABOLIC PANEL WITH GFR  Result Value Ref Range   Glucose, Bld 144 (H) 65 - 99 mg/dL   BUN 6 (L) 7 - 25 mg/dL   Creat 0.92 0.70 - 1.25 mg/dL   GFR, Est Non African American 88 > OR = 60 mL/min/1.55m2   GFR, Est African American 102 > OR = 60 mL/min/1.21m2   BUN/Creatinine Ratio 7 6 - 22 (calc)   Sodium 134 (L) 135 - 146 mmol/L   Potassium 5.0 3.5 - 5.3 mmol/L   Chloride 96 (L) 98 - 110 mmol/L   CO2 27 20 - 32 mmol/L   Calcium 9.8 8.6 - 10.3 mg/dL   Total Protein 7.1 6.1 - 8.1 g/dL   Albumin 4.3 3.6 - 5.1 g/dL   Globulin 2.8 1.9 - 3.7 g/dL (calc)   AG Ratio 1.5 1.0 - 2.5 (calc)   Total Bilirubin 0.9 0.2 - 1.2 mg/dL   Alkaline phosphatase (APISO) 55 35 - 144 U/L   AST 109 (H) 10 - 35 U/L   ALT 79 (H) 9 - 46 U/L  CBC with Differential/Platelet  Result Value Ref Range   WBC 4.6 3.8 - 10.8 Thousand/uL   RBC 4.17 (L) 4.20 - 5.80 Million/uL   Hemoglobin 14.7 13.2 - 17.1 g/dL   HCT 41.8 38.5 - 50.0 %   MCV 100.2 (H) 80.0 - 100.0 fL   MCH 35.3 (H) 27.0 - 33.0 pg   MCHC 35.2 32.0 - 36.0 g/dL   RDW 12.6 11.0 - 15.0 %   Platelets 154 140 - 400 Thousand/uL   MPV 9.9 7.5 - 12.5 fL   Neutro Abs 2,613 1,500 - 7,800 cells/uL   Lymphs Abs 1,159 850 - 3,900 cells/uL   Absolute Monocytes 419 200 - 950 cells/uL   Eosinophils Absolute 350 15 - 500 cells/uL   Basophils Absolute 60 0 - 200 cells/uL   Neutrophils Relative % 56.8 %   Total Lymphocyte 25.2 %   Monocytes Relative 9.1 %   Eosinophils Relative 7.6 %   Basophils Relative 1.3 %  Hemoglobin  A1c  Result Value Ref Range   Hgb A1c MFr Bld 5.8 (H) <5.7 % of total Hgb   Mean Plasma Glucose 120 (calc)   eAG (mmol/L) 6.6 (calc)      Assessment & Plan:   Problem List Items Addressed This Visit    Overweight (BMI 25.0-29.9)    Encourage lifestyle weight management      Major depression, recurrent, chronic (HCC)    Controlled mood on SSRI and Trazodone - some stressors but managing Insomnia improved  as well on current dose Offered SSRI Sertraline, reconsider in future, he will notify if ready to start med, not interested in therapist at this time, has done this in past did not benefit Follow-up as planned      Hyperlipidemia associated with type 2 diabetes mellitus ([place])    Mild elevated LDL Last lipid panel 05/2019 Calculated ASCVD 10 yr risk score 28.4% and DM Prior statin failed Simvastatin (stopped due to LFTs, however chart review shows unlikely d/t statin since still elevated >1 year off statin, likely from prior alcohol history)  Plan: 1. Reconsider Statin 2. Continue ASA 81mg  for primary ASCVD risk reduction 3. Encourage improved lifestyle - low carb/cholesterol, reduce portion size, continue improving regular exercise      Relevant Medications   benazepril (LOTENSIN) 40 MG tablet   metFORMIN (GLUCOPHAGE) 1000 MG tablet   Essential hypertension    Controlled HTN No known complications     Plan:  1. Continue current BP regimen Benazepril 40mg  daily - refilled 2. Encourage improved lifestyle - low sodium diet, regular exercise 3. Continue monitor BP outside office, bring readings to next visit, if persistently >140/90 or new symptoms notify office sooner 4. Follow-up 6 months      Relevant Medications   benazepril (LOTENSIN) 40 MG tablet   DM (diabetes mellitus), type 2 with neurological complications (HCC)    Controlled DM at A1c 5.8, on GLP1 and Diet Complications - peripheral neuropathy, other including hyperlipidemia, GERD, depression - increases risk  of future cardiovascular complications poor glucose control due to reduced lifestyle diet/exercise with low energy mood and fatigue - Failed Victoza. OFF Glimepiride and NPH Insulin  Plan:  1. CONTINUE dose for Ozepmic up to 1mg  weekly injection - CONTINUE Metformin 1000mg  BID 2. Encourage improved lifestyle - low carb, low sugar diet, reduce portion size, continue improving regular exercise 3. Check CBG, bring log to next visit for review 4. Continue ASA, ACEi, Statin therapy Follow-up 6 months      Relevant Medications   benazepril (LOTENSIN) 40 MG tablet   metFORMIN (GLUCOPHAGE) 1000 MG tablet   Alcohol dependence in remission ([place])    Stable, without any recurrence of alcohol intake       Other Visit Diagnoses    Annual physical exam    -  Primary   Needs flu shot       Relevant Orders   Flu Vaccine QUAD 6+ mos PF IM (Fluarix Quad PF) (Completed)     Updated Health Maintenance information Reviewed recent lab results with patient Encouraged improvement to lifestyle with diet and exercise - Goal of weight loss    Meds ordered this encounter  Medications  . benazepril (LOTENSIN) 40 MG tablet    Sig: Take 1 tablet (40 mg total) by mouth daily at 6 (six) AM.    Dispense:  90 tablet    Refill:  3  . metFORMIN (GLUCOPHAGE) 1000 MG tablet    Sig: Take 1 tablet (1,000 mg total) by mouth 2 (two) times daily with a meal.    Dispense:  180 tablet    Refill:  3     Follow up plan: Return in about 6 months (around 12/09/2019) for 6 month follow-up DM A1c, Mood.  Nobie Putnam, [facility] 06/10/2019, 8:36 AM

## 2019-06-10 NOTE — Assessment & Plan Note (Signed)
Mild elevated LDL Last lipid panel 05/2019 Calculated ASCVD 10 yr risk score 28.4% and DM Prior statin failed Simvastatin (stopped due to LFTs, however chart review shows unlikely d/t statin since still elevated >1 year off statin, likely from prior alcohol history)  Plan: 1. Reconsider Statin 2. Continue ASA 81mg  for primary ASCVD risk reduction 3. Encourage improved lifestyle - low carb/cholesterol, reduce portion size, continue improving regular exercise

## 2019-06-10 NOTE — Assessment & Plan Note (Addendum)
Controlled DM at A1c 5.8, on GLP1 and Diet Complications - peripheral neuropathy, other including hyperlipidemia, GERD, depression - increases risk of future cardiovascular complications poor glucose control due to reduced lifestyle diet/exercise with low energy mood and fatigue - Failed Victoza. OFF Glimepiride and NPH Insulin  Plan:  1. CONTINUE dose for Ozepmic up to 1mg  weekly injection - CONTINUE Metformin 1000mg  BID 2. Encourage improved lifestyle - low carb, low sugar diet, reduce portion size, continue improving regular exercise 3. Check CBG, bring log to next visit for review 4. Continue ASA, ACEi, Statin therapy Follow-up 6 months

## 2019-06-10 NOTE — Patient Instructions (Addendum)
Thank you for coming to the office today.  Overall good lab results. Slightly elevated sugar to 5.8, still well controlled!  If you are interested in an anti depressant we can consider Sertraline (Zoloft) if you would like, send me a message to request this anytime in the next month.  When you can - please request a copy of the Diabetic Eye Exam to be faxed to [place] from Dr Gloriann Loan so we can update your chart.  If you are interested in Podiatry or Neurology for feet - let me know  Keep up current regimen Refilled Benazepril and Metformin  Please schedule a Follow-up Appointment to: Return in about 6 months (around 12/09/2019) for 6 month follow-up DM A1c, Mood.  If you have any other questions or concerns, please feel free to call the office or send a message through [place]. You may also schedule an earlier appointment if necessary.  Additionally, you may be receiving a survey about your experience at our office within a few days to 1 week by e-mail or mail. We value your feedback.  Nobie Putnam, DO [place]

## 2019-06-10 NOTE — Assessment & Plan Note (Signed)
Controlled mood on SSRI and Trazodone - some stressors but managing Insomnia improved as well on current dose Offered SSRI Sertraline, reconsider in future, he will notify if ready to start med, not interested in therapist at this time, has done this in past did not benefit Follow-up as planned

## 2019-06-11 ENCOUNTER — Ambulatory Visit: Payer: BC Managed Care – PPO | Admitting: Urology

## 2019-06-11 ENCOUNTER — Encounter: Payer: Self-pay | Admitting: Urology

## 2019-06-11 VITALS — BP 170/89 | HR 85 | Ht 71.0 in | Wt 198.9 lb

## 2019-06-11 DIAGNOSIS — R350 Frequency of micturition: Secondary | ICD-10-CM

## 2019-06-11 DIAGNOSIS — N5201 Erectile dysfunction due to arterial insufficiency: Secondary | ICD-10-CM | POA: Diagnosis not present

## 2019-06-11 DIAGNOSIS — N401 Enlarged prostate with lower urinary tract symptoms: Secondary | ICD-10-CM | POA: Diagnosis not present

## 2019-06-11 DIAGNOSIS — F339 Major depressive disorder, recurrent, unspecified: Secondary | ICD-10-CM

## 2019-06-11 LAB — BLADDER SCAN AMB NON-IMAGING

## 2019-06-11 MED ORDER — ALLOPURINOL 300 MG PO TABS: 300.0000 mg | ORAL_TABLET | Freq: Every day | ORAL | 3 refills | Status: DC

## 2019-06-11 MED ORDER — SILDENAFIL CITRATE 20 MG PO TABS: ORAL_TABLET | ORAL | 1 refills | Status: DC

## 2019-06-11 MED ORDER — TAMSULOSIN HCL 0.4 MG PO CAPS: 0.8000 mg | ORAL_CAPSULE | Freq: Every day | ORAL | 0 refills | Status: DC

## 2019-06-11 MED ORDER — SERTRALINE HCL 50 MG PO TABS: 50.0000 mg | ORAL_TABLET | Freq: Every day | ORAL | 2 refills | Status: DC

## 2019-06-11 NOTE — Progress Notes (Signed)
06/11/2019 1:26 PM   Richard Render Lax Sr. [birth year] [id number]  Referring provider: [name], DO [address] [place],  [place] [address]  Chief Complaint  Patient presents with  . Benign Prostatic Hypertrophy  . Urinary Frequency    Urologic history:  1.  History elevated PSA -Prostate biopsy 2007 Dr. Jacqlyn Larsen; PSA not listed in record review -Path ASAP -Rebiopsy negative  2.  BPH with lower urinary tract symptoms -Tamsulosin  3.  History urinary calculi -History uric acid stones -On allopurinol  4.  Erectile dysfunction -On generic sildenafil   HPI: 63 y.o. male presents for annual follow-up.  Since his visit last year he has noted worsening lower urinary tract symptoms.  He remains on tamsulosin and saw palmetto.  He has noted worsening urinary frequency, intermittency and a weak urinary stream.  He has nocturia x1.  IPSS completed today was 21/35 with a QoL rated 5/6.  Denies dysuria or gross hematuria.  No flank, abdominal or pelvic pain.  PSA performed by his PCP November 2020 was stable at 1.0.  He remains on generic sildenafil for ED with good efficacy.   PMH: Past Medical History:  Diagnosis Date  . Anxiety   . Cholelithiasis   . GERD (gastroesophageal reflux disease)   . Hyperlipidemia     Surgical History: Past Surgical History:  Procedure Laterality Date  . Lung Collapse  63 years old.     Home Medications:  Allergies as of 06/11/2019      Reactions   Bee Venom Swelling   Itching       Medication List       Accurate as of June 11, 2019  1:26 PM. If you have any questions, ask your nurse or doctor.        allopurinol 300 MG tablet Commonly known as: ZYLOPRIM Take 1 tablet (300 mg total) by mouth daily.   aspirin 81 MG tablet Take 1 tablet by mouth daily at 6 (six) AM.   benazepril 40 MG tablet Commonly known as: LOTENSIN Take 1 tablet (40 mg total) by mouth daily at 6 (six) AM.   CALCIUM ACETATE PO Take by mouth.    glucose blood test strip Commonly known as: Estate manager/land agent Use as instructed to check blood sugars twice a day.   ketoconazole 2 % shampoo Commonly known as: NIZORAL Apply 1 application topically 2 (two) times a week. As needed for scalp dermatitis   Magnesium Gluconate 500 (27 Mg) MG Tabs Take by mouth.   Melatonin 10 MG Tabs   metFORMIN 1000 MG tablet Commonly known as: GLUCOPHAGE Take 1 tablet (1,000 mg total) by mouth 2 (two) times daily with a meal.   MULTIPLE VITAMIN PO Take by mouth.   Ozempic (1 MG/DOSE) 2 MG/1.5ML Sopn Generic drug: Semaglutide (1 MG/DOSE) INJECT 1MG  INTO THE SKIN ONCE A WEEK   saw palmetto 160 MG capsule Take 1 capsule (160 mg total) by mouth 2 (two) times daily.   sildenafil 20 MG tablet Commonly known as: REVATIO Take 3-5 tablets daily by mouth as needed   tamsulosin 0.4 MG Caps capsule Commonly known as: FLOMAX Take 2 capsules (0.8 mg total) by mouth daily. What changed: how much to take Changed by: [name], MD   traZODone 50 MG tablet Commonly known as: DESYREL Take 1.5 tablets (75 mg total) by mouth at bedtime.   triamcinolone cream 0.5 % Commonly known as: KENALOG Apply 1 application topically 2 (two) times daily. To  affected areas, for up to 2 weeks.   vitamin B-12 1000 MCG tablet Commonly known as: CYANOCOBALAMIN Take by mouth.       Allergies:  Allergies  Allergen Reactions  . Bee Venom Swelling    Itching     Family History: Family History  Problem Relation Age of Onset  . Diabetes Father        complications of DM caused death  . Heart attack Father   . Diabetes Sister   . Cancer Mother        lung  . Lung cancer Mother     Social History:  reports that he quit smoking about 19 years ago. He quit after 10.00 years of use. He has quit using smokeless tobacco. He reports that he does not drink alcohol or use drugs.  ROS: UROLOGY Frequent Urination?: Yes Hard to postpone urination?: Yes  Burning/pain with urination?: No Get up at night to urinate?: Yes Leakage of urine?: Yes Urine stream starts and stops?: Yes Trouble starting stream?: No Do you have to strain to urinate?: No Blood in urine?: No Urinary tract infection?: No Sexually transmitted disease?: No Injury to kidneys or bladder?: No Painful intercourse?: No Weak stream?: Yes Erection problems?: Yes Penile pain?: No  Gastrointestinal Nausea?: No Vomiting?: No Indigestion/heartburn?: Yes Diarrhea?: No Constipation?: No  Constitutional Fever: No Night sweats?: No Weight loss?: No Fatigue?: No  Skin Skin rash/lesions?: No Itching?: No  Eyes Blurred vision?: No Double vision?: No  Ears/Nose/Throat Sore throat?: No Sinus problems?: No  Hematologic/Lymphatic Swollen glands?: No Easy bruising?: No  Cardiovascular Leg swelling?: No Chest pain?: No  Respiratory Cough?: No Shortness of breath?: No  Endocrine Excessive thirst?: No  Musculoskeletal Back pain?: No Joint pain?: No  Neurological Headaches?: No Dizziness?: No  Psychologic Depression?: No Anxiety?: Yes  Physical Exam: BP (!) 170/89 (BP Location: Left Arm, Patient Position: Sitting, Cuff Size: Normal)   Pulse 85   Ht 5\' 11"  (1.803 m)   Wt 198 lb 14.4 oz (90.2 kg)   BMI 27.74 kg/m   Constitutional:  Alert and oriented, No acute distress. HEENT: [place] AT, moist mucus membranes.  Trachea midline, no masses. Cardiovascular: No clubbing, cyanosis, or edema. Respiratory: Normal respiratory effort, no increased work of breathing. GU: Prostate 35 g, smooth without nodules Lymph: No cervical or inguinal lymphadenopathy. Skin: No rashes, bruises or suspicious lesions. Neurologic: Grossly intact, no focal deficits, moving all 4 extremities. Psychiatric: Normal mood and affect.   Assessment & Plan:    - Benign prostatic hyperplasia with LUTS Worsening lower urinary tract symptoms presently severe by IPSS.  Options were  discussed of titrating tamsulosin or adding a 5-ARI.  Surgical options were also discussed including TURP, PVP and minimally invasive options including UroLift.  He may be interested in pursuing minimally invasive option and was given literature.  For now he would like to increase the tamsulosin to 0.8 mg and a 30-day Rx was sent to pharmacy.  He will call back regarding efficacy.  PVR by bladder scan today was 13 mL.  - History elevated PSA Benign DRE and stable PSA  - Erectile dysfunction Stable.  Sildenafil was refilled  - History uric acid calculi Asymptomatic.  Allopurinol refilled  Return in about 1 year (around 06/10/2020) for Recheck.   [name], [place] [address], [place], [place] [address] [phone]

## 2019-06-12 LAB — URINALYSIS, COMPLETE
Bilirubin, UA: NEGATIVE
Glucose, UA: NEGATIVE
Ketones, UA: NEGATIVE
Leukocytes,UA: NEGATIVE
Nitrite, UA: NEGATIVE
Protein,UA: NEGATIVE
RBC, UA: NEGATIVE
Specific Gravity, UA: 1.01 (ref 1.005–1.030)
Urobilinogen, Ur: 0.2 mg/dL (ref 0.2–1.0)
pH, UA: 5 (ref 5.0–7.5)

## 2019-06-12 LAB — MICROSCOPIC EXAMINATION
Bacteria, UA: NONE SEEN
RBC, Urine: NONE SEEN /hpf (ref 0–2)

## 2019-06-16 ENCOUNTER — Encounter: Payer: Self-pay | Admitting: Family Medicine

## 2019-07-18 ENCOUNTER — Other Ambulatory Visit: Payer: Self-pay | Admitting: Urology

## 2019-07-18 DIAGNOSIS — R350 Frequency of micturition: Secondary | ICD-10-CM

## 2019-07-18 DIAGNOSIS — N401 Enlarged prostate with lower urinary tract symptoms: Secondary | ICD-10-CM

## 2019-07-18 DIAGNOSIS — N5201 Erectile dysfunction due to arterial insufficiency: Secondary | ICD-10-CM

## 2019-07-20 MED ORDER — TAMSULOSIN HCL 0.4 MG PO CAPS: 0.8000 mg | ORAL_CAPSULE | Freq: Every day | ORAL | 0 refills | Status: DC

## 2019-07-21 ENCOUNTER — Other Ambulatory Visit: Payer: Self-pay | Admitting: Urology

## 2019-07-21 DIAGNOSIS — N401 Enlarged prostate with lower urinary tract symptoms: Secondary | ICD-10-CM

## 2019-07-21 DIAGNOSIS — N5201 Erectile dysfunction due to arterial insufficiency: Secondary | ICD-10-CM

## 2019-07-21 DIAGNOSIS — R350 Frequency of micturition: Secondary | ICD-10-CM

## 2019-09-21 ENCOUNTER — Other Ambulatory Visit: Payer: Self-pay | Admitting: Family Medicine

## 2019-09-21 ENCOUNTER — Other Ambulatory Visit: Payer: Self-pay | Admitting: Urology

## 2019-09-21 DIAGNOSIS — N401 Enlarged prostate with lower urinary tract symptoms: Secondary | ICD-10-CM

## 2019-09-21 DIAGNOSIS — N5201 Erectile dysfunction due to arterial insufficiency: Secondary | ICD-10-CM

## 2019-09-21 DIAGNOSIS — F339 Major depressive disorder, recurrent, unspecified: Secondary | ICD-10-CM

## 2019-09-23 ENCOUNTER — Encounter: Payer: Self-pay | Admitting: Urology

## 2019-09-23 ENCOUNTER — Other Ambulatory Visit: Payer: Self-pay | Admitting: *Deleted

## 2019-09-23 DIAGNOSIS — N5201 Erectile dysfunction due to arterial insufficiency: Secondary | ICD-10-CM

## 2019-09-23 DIAGNOSIS — F339 Major depressive disorder, recurrent, unspecified: Secondary | ICD-10-CM

## 2019-09-23 DIAGNOSIS — N401 Enlarged prostate with lower urinary tract symptoms: Secondary | ICD-10-CM

## 2019-09-23 MED ORDER — SERTRALINE HCL 50 MG PO TABS: 50.0000 mg | ORAL_TABLET | Freq: Every day | ORAL | 3 refills | Status: DC

## 2019-09-23 MED ORDER — TAMSULOSIN HCL 0.4 MG PO CAPS: 0.8000 mg | ORAL_CAPSULE | Freq: Every day | ORAL | 2 refills | Status: DC

## 2019-09-23 MED ORDER — ALLOPURINOL 300 MG PO TABS: 300.0000 mg | ORAL_TABLET | Freq: Every day | ORAL | 3 refills | Status: DC

## 2019-10-10 DIAGNOSIS — E1149 Type 2 diabetes mellitus with other diabetic neurological complication: Secondary | ICD-10-CM

## 2019-10-13 ENCOUNTER — Telehealth: Payer: Self-pay | Admitting: Family Medicine

## 2019-10-13 NOTE — Chronic Care Management (AMB) (Signed)
  Care Management   Note  10/13/2019 Name: Richard BAUTISTA Sr. MRN: [id number] DOB: 11-11-1955  Richard Render Solecki Sr. is a 64 y.o. year old male who is a primary care patient of [name], DO. I reached out to [place]. by phone today in response to a referral sent by [name]'s health plan.    Richard Day was given information about care management services today including:  1. Care management services include personalized support from designated clinical staff supervised by his physician, including individualized plan of care and coordination with other care providers 2. 24/7 contact phone numbers for assistance for urgent and routine care needs. 3. The patient may stop care management services at any time by phone call to the office staff.  Patient agreed to services and verbal consent obtained.   Follow up plan: Telephone appointment with care management team member scheduled for:11/09/2019  Richard Durand, LPN Health Advisor, [facility] Management ??[email or web address] ??[phone]

## 2019-10-13 NOTE — Chronic Care Management (AMB) (Signed)
  Care Management   Outreach Note  10/13/2019 Name: Richard DUECK Sr. MRN: [id number] DOB: [birth year]  Referred by: [name], DO Reason for referral : Care Management (CM Initial outreach unsuccessful)   An unsuccessful telephone outreach was attempted today. The patient was referred to the case management team for assistance with care management and care coordination.   Follow Up Plan: A HIPPA compliant phone message was left for the patient providing contact information and requesting a return call.  The care management team will reach out to the patient again over the next 7 days.  If patient returns call to provider office, please advise to call Embedded Care Management Care Guide Glenna Durand LPN at [phone]  Miski Feldpausch, LPN Health Advisor, [place] Management ??[email or web address] ??[phone]

## 2019-10-20 DIAGNOSIS — I10 Essential (primary) hypertension: Secondary | ICD-10-CM

## 2019-10-20 DIAGNOSIS — E1149 Type 2 diabetes mellitus with other diabetic neurological complication: Secondary | ICD-10-CM

## 2019-10-20 DIAGNOSIS — F339 Major depressive disorder, recurrent, unspecified: Secondary | ICD-10-CM

## 2019-10-20 MED ORDER — BENAZEPRIL HCL 40 MG PO TABS: 40.0000 mg | ORAL_TABLET | Freq: Every day | ORAL | 3 refills | Status: DC

## 2019-10-20 MED ORDER — SERTRALINE HCL 50 MG PO TABS: 50.0000 mg | ORAL_TABLET | Freq: Every day | ORAL | 3 refills | Status: DC

## 2019-10-20 MED ORDER — TRAZODONE HCL 50 MG PO TABS: 75.0000 mg | ORAL_TABLET | Freq: Every day | ORAL | 3 refills | Status: DC

## 2019-10-20 MED ORDER — METFORMIN HCL 1000 MG PO TABS: 1000.0000 mg | ORAL_TABLET | Freq: Two times a day (BID) | ORAL | 3 refills | Status: DC

## 2019-10-21 ENCOUNTER — Other Ambulatory Visit: Payer: Self-pay | Admitting: *Deleted

## 2019-10-21 ENCOUNTER — Encounter: Payer: Self-pay | Admitting: Urology

## 2019-10-21 DIAGNOSIS — N5201 Erectile dysfunction due to arterial insufficiency: Secondary | ICD-10-CM

## 2019-10-21 DIAGNOSIS — N401 Enlarged prostate with lower urinary tract symptoms: Secondary | ICD-10-CM

## 2019-10-21 MED ORDER — TAMSULOSIN HCL 0.4 MG PO CAPS: 0.8000 mg | ORAL_CAPSULE | Freq: Every day | ORAL | 3 refills | Status: DC

## 2019-11-09 ENCOUNTER — Ambulatory Visit (INDEPENDENT_AMBULATORY_CARE_PROVIDER_SITE_OTHER): Payer: Medicare HMO | Admitting: Pharmacist

## 2019-11-09 DIAGNOSIS — E1169 Type 2 diabetes mellitus with other specified complication: Secondary | ICD-10-CM

## 2019-11-09 DIAGNOSIS — E1149 Type 2 diabetes mellitus with other diabetic neurological complication: Secondary | ICD-10-CM | POA: Diagnosis not present

## 2019-11-09 DIAGNOSIS — E785 Hyperlipidemia, unspecified: Secondary | ICD-10-CM

## 2019-11-09 DIAGNOSIS — I10 Essential (primary) hypertension: Secondary | ICD-10-CM

## 2019-11-09 NOTE — Chronic Care Management (AMB) (Signed)
Chronic Care Management   Note  11/09/2019 Name: Richard NETRO Sr. MRN: [id number] DOB: Apr 19, 1956   Subjective:   Richard Render Hank Sr. is Day 64 y.o. year old male who is Day primary care patient of Richard Day, Richard Day. The CM team was consulted for assistance with chronic disease management and care coordination.   I reached out to Richard Columbia Sr. by phone today.   Review of patient status, including review of consultants reports, laboratory and other test data, was performed as part of comprehensive evaluation and provision of chronic care management services.   Objective:  Lab Results  Component Value Date   CREATININE 0.92 06/02/2019   CREATININE 1.00 05/26/2018   CREATININE 0.85 11/21/2017    Lab Results  Component Value Date   HGBA1C 5.8 (H) 06/02/2019       Component Value Date/Time   CHOL 217 (H) 06/02/2019 0822   CHOL 226 (H) 01/20/2015 1501   TRIG 303 (H) 06/02/2019 0822   HDL 41 06/02/2019 0822   HDL 44 01/20/2015 1501   CHOLHDL 5.3 (H) 06/02/2019 0822   LDLCALC 133 (H) 06/02/2019 0822    ASCVD Risk The 10-year ASCVD risk score Richard Bussing DC Jr., et al., 2013) is: 45.9%   Values used to calculate the score:     Age: 44 years     Sex: Male     Is Non-Hispanic African American: No     Diabetic: Yes     Tobacco smoker: No     Systolic Blood Pressure: 123XX123 mmHg     Is BP treated: Yes     HDL Cholesterol: 41 mg/dL     Total Cholesterol: 217 mg/dL    BP Readings from Last 3 Encounters:  06/11/19 (!) 170/89  06/10/19 134/64  06/02/18 121/73    Allergies  Allergen Reactions  . Bee Venom Swelling    Itching     Medications Reviewed Today    Reviewed by Richard Day, [place] (Pharmacist) on 11/09/19 at [place] List Status: <None>  Medication Order Taking? Sig Documenting Provider Last Dose Status Informant  allopurinol (ZYLOPRIM) 300 MG tablet [id number] Yes Take 1 tablet (300 mg total) by mouth daily. Richard Day, Richard Day Taking Active     aspirin 81 MG tablet [id number] Yes Take 1 tablet by mouth daily at 6 (six) AM. [name], Richard Nigh., Richard Day Taking Active            Med Note [facility], Richard Day   Sat Nov 06, 2014 11:01 AM) Received from: [place]:   benazepril (LOTENSIN) 40 MG tablet [id number] Yes Take 1 tablet (40 mg total) by mouth daily at 6 (six) AM. Richard Day, Richard Day Taking Active   Calcium Acetate, Phos Binder, (CALCIUM ACETATE PO) [id number] No Take by mouth. Provider, Historical, Richard Day Not Taking Active   Calcium Citrate-Vitamin D (CALCIUM CITRATE + D PO) [id number] Yes Take 1 tablet by mouth every evening. Provider, Historical, Richard Day Taking Active   glucose blood (BAYER CONTOUR TEST) test strip [id number]  Use as instructed to check blood sugars twice Day day. Richard Day, Richard Day, Richard Day  Active   ketoconazole (NIZORAL) 2 % shampoo [id number] Yes Apply 1 application topically 2 (two) times Day week. As needed for scalp dermatitis Richard Day, Richard Day, Richard Day Taking Active   Magnesium Gluconate 500 (27 MG) MG TABS [id number] Yes Take 1 tablet by mouth every evening.  Richard Porta., Richard Day Taking Active  Med Note ([name], Richard Day   Sat Nov 06, 2014 11:01 AM) Received from: [employer]  melatonin 5 MG TABS [id number] Yes Take 1 tablet by mouth at bedtime.  Provider, Historical, Richard Day Taking Active   metFORMIN (GLUCOPHAGE) 1000 MG tablet [id number] Yes Take 1 tablet (1,000 mg total) by mouth 2 (two) times daily with Day meal. Richard Day, Richard Day Taking Active   MULTIPLE VITAMIN PO [id number] Yes Take by mouth. Richard Porta., Richard Day Taking Active            Med Note Richard Day, Richard Day   Sat Nov 06, 2014 11:01 AM) Received from: [employer], 1 MG/DOSE, 2 MG/1.5ML Richard Day [id number] Yes INJECT 1MG  INTO THE SKIN ONCE Day WEEK Richard Day, Richard Day Taking Active            Med Note Richard Day, Richard Day Day   Mon Nov 09, 2019 10:47 AM) On Thursdays   saw palmetto 160 MG capsule [id number] Yes Take 1 capsule (160 mg total) by mouth 2 (two) times daily. Richard Day, Richard Day Taking Active   sertraline (ZOLOFT) 50 MG tablet [id number] Yes Take 1 tablet (50 mg total) by mouth daily. Richard Day, Richard Day Taking Active   sildenafil (REVATIO) 20 MG tablet [id number] Yes Take 3-5 tablets daily by mouth as needed Richard Day, Richard Fairly, Richard Day Taking Active   tamsulosin (FLOMAX) 0.4 MG CAPS capsule [id number] Yes Take 2 capsules (0.8 mg total) by mouth daily. Richard Day, Richard Day Taking Active   traZODone (DESYREL) 50 MG tablet [id number] Yes Take 1.5 tablets (75 mg total) by mouth at bedtime. Richard Day, Richard Day Taking Active        Patient not taking:      Discontinued 11/09/19 1049 (No longer needed (for PRN medications))   vitamin B-12 (CYANOCOBALAMIN) 1000 MCG tablet [id number] Yes Take by mouth. Provider, Historical, Richard Day Taking Active            Assessment:   Goals Addressed            This Visit's Progress   . PharmD - Medication Assistance       CARE PLAN ENTRY (see longtitudinal plan of care for additional care plan information)  Current Barriers:  . Financial Barriers in complicated patient with multiple medical conditions including HTN, T2DM, HLD, BPH and depression; patient has [employer] and reports copay for Ozempic is cost prohibitive at this time  Pharmacist Clinical Goal(s):  Marland Kitchen Over the next 30 days, patient will work with PharmD and providers to relieve medication access concerns  Interventions: . Comprehensive medication review completed; medication list updated in electronic medical record.  o Counsel patient to use OTC second-generation antihistamine medication such as generic Zyrtec or Claritin for his allergies, rather than diphenhydramine (Benadryl) to reduce risk of sedation, confusion and other anticholinergic effects, particularly given patient's age ? 14. - Reported  has been using OTC diphenhydramine for management of seasonal allergy symptoms o Patient with T2DM not currently on statin - From review of office visit note from PCP (06/10/19): "Prior statin failed Simvastatin (stopped due to LFTs, however chart review shows unlikely d/t statin since still elevated >1 year off statin, likely from prior alcohol history)".  - PCP's plan: to reconsider statin . Counsel patient on health plan Part D benefit . Counsel on medication assistance options o Based on reported income, patient not eligible for Extra Help Subsidy o Ozempic by [employer]: Patient  meets income criteria for this medication's patient assistance program. Reviewed application process.  . Discuss blood sugar control and monitoring o Reports taking: - Metformin 1000 mg twice daily - Ozempic 1 mg weekly (on Thursdays) o Reports checking CBGs twice daily - Recent morning readings running ~120s-140s o Denies s/s low blood sugar . Counsel on benefit of home BP monitoring o Encourage patient to consider obtaining home upper arm BP monitor . Will collaborate with [facility] CPhT to assist patient with applying for patient assistance for Ozempic through [employer] . Inter-disciplinary care team collaboration (see longitudinal plan of care)  Patient Self Care Activities:  . Patient will provide necessary portions of application  . Patient attends medical appointments as scheduled o Next appointment with PCP on 6/8  Initial goal documentation        Plan:  Telephone follow up appointment with care management team member scheduled for: 5/31 at 38 am  Harlow Asa, PharmD, [place] [phone]

## 2019-11-09 NOTE — Patient Instructions (Addendum)
Thank you allowing the Chronic Care Management Team to be a part of your care! It was a pleasure speaking with you today!     CCM (Chronic Care Management) Team    Noreene Larsson RN, MSN, CCM Nurse Care Coordinator  [phone]   Harlow Asa PharmD  Clinical Pharmacist  [phone]   Eula Fried LCSW Clinical Social Worker [phone]  Visit Information  Goals Addressed            This Visit's Progress   . PharmD - Medication Assistance       CARE PLAN ENTRY (see longtitudinal plan of care for additional care plan information)  Current Barriers:  . Financial Barriers in complicated patient with multiple medical conditions including HTN, T2DM, HLD, BPH and depression; patient has [employer] and reports copay for Ozempic is cost prohibitive at this time  Pharmacist Clinical Goal(s):  Marland Kitchen Over the next 30 days, patient will work with PharmD and providers to relieve medication access concerns  Interventions: . Comprehensive medication review completed; medication list updated in electronic medical record.  o Counsel patient to use OTC second-generation antihistamine medication such as generic Zyrtec or Claritin for his allergies, rather than diphenhydramine (Benadryl) to reduce risk of sedation, confusion and other anticholinergic effects, particularly given patient's age ? 16. - Reported has been using OTC diphenhydramine for management of seasonal allergy symptoms o Patient with T2DM not currently on statin - From review of office visit note from PCP (06/10/19): "Prior statin failed Simvastatin (stopped due to LFTs, however chart review shows unlikely d/t statin since still elevated >1 year off statin, likely from prior alcohol history)".  - PCP's plan: to reconsider statin . Counsel patient on health plan Part D benefit . Counsel on medication assistance options o Based on reported income, patient not eligible for Extra Help  Subsidy o Ozempic by [employer]: Patient meets income criteria for this medication's patient assistance program. Reviewed application process.  . Discuss blood sugar control and monitoring o Reports taking: - Metformin 1000 mg twice daily - Ozempic 1 mg weekly (on Thursdays) o Reports checking CBGs twice daily - Recent morning readings running ~120s-140s o Denies s/s low blood sugar . Counsel on benefit of home BP monitoring o Encourage patient to consider obtaining home upper arm BP monitor . Will collaborate with [facility] CPhT to assist patient with applying for patient assistance for Ozempic through [employer] . Inter-disciplinary care team collaboration (see longitudinal plan of care)  Patient Self Care Activities:  . Patient will provide necessary portions of application  . Patient attends medical appointments as scheduled o Next appointment with PCP on 6/8  Initial goal documentation        Patient verbalizes understanding of instructions provided today.   Telephone follow up appointment with care management team member scheduled for: 5/31 at 9 am  Harlow Asa, PharmD, [place] [phone]

## 2019-11-10 ENCOUNTER — Other Ambulatory Visit: Payer: Self-pay | Admitting: Pharmacy Technician

## 2019-11-10 NOTE — Patient Outreach (Signed)
[place] [facility]) Care Management  11/10/2019  Paxton Carlone Minturn Sr. 06/17/1956 [id number]                                       Medication Assistance Referral  Referral From: [place] RPh Dorthula Perfect   Medication/Company: Larna Daughters / Novo Nordisk Patient application portion:  Mailed Provider application portion: Faxed  to Dr. Nobie Putnam Provider address/fax verified via: Office website     Follow up:  Will follow up with patient in 10-15 business days to confirm application(s) have been received.  Ailyn Gladd P. Jahan Friedlander, [place]  [phone]

## 2019-11-23 ENCOUNTER — Other Ambulatory Visit: Payer: Self-pay | Admitting: Pharmacy Technician

## 2019-11-23 NOTE — Patient Outreach (Signed)
[facility]) Care Management  11/23/2019  Mekell Ditoro Allan Sr. [birth year] [id number]   Successful call placed to patient regarding patient assistance application(s) for Ozempic with [employer] , HIPAA identifiers verified.   Patient informed he has received the application, filled it out and attached necessary information and mailed it all back last week to my attention.  Follow up:  Will route note to embedded [facility] RPh Harlow Asa for case closure if document(s) have not been received in the next 15 business days.  Aletheia Tangredi P. Imojean Yoshino, [place]  [phone]

## 2019-11-24 ENCOUNTER — Other Ambulatory Visit: Payer: Self-pay | Admitting: Pharmacy Technician

## 2019-11-24 NOTE — Patient Outreach (Signed)
[facility]) Care Management  11/24/2019  Itsuki Ripoll Duan Sr. [birth year] [id number]   Received both patient and provider portion(s) of patient assistance application(s) for Ozempic. Faxed completed application and required documents into [employer].  Will follow up with company(ies) in 2-5 business days to check status of application(s).  Philopater Mucha P. Brae Gartman, [place]  [phone]

## 2019-11-26 ENCOUNTER — Other Ambulatory Visit: Payer: Self-pay | Admitting: Pharmacy Technician

## 2019-11-26 NOTE — Patient Outreach (Signed)
[facility]) Care Management  11/26/2019  Romi Edgecomb Weare Sr. 1955/12/12 [id number]  Care coordination call placed to [place] in regards to patient's Ozempic application.  Spoke to Scott who informed patient had been APPROVED 11/26/2019-06/07/2020. She informed the patient should receive 5 boxes delivered to the provider's office in the next 10-14 business days.  Will follow up with patient in 10-15 business days to inquire if medication was received and to discuss refill procedure.  Cerissa Zeiger P. Zachory Mangual, [place]  [phone]

## 2019-11-27 ENCOUNTER — Telehealth: Payer: Self-pay | Admitting: Family Medicine

## 2019-11-27 NOTE — Chronic Care Management (AMB) (Signed)
  Care Management   Note  11/27/2019 Name: Richard GURKIN Sr. MRN: [id number] DOB: [birth year]  Richard Render Shomaker Sr. is a 64 y.o. year old male who is a primary care patient of [name], DO and is actively engaged with the care management team. I reached out to [place]. by phone today to assist with re-scheduling a follow up visit with the Pharmacist  Follow up plan: Unsuccessful telephone outreach attempt made. A HIPPA compliant phone message was left for the patient providing contact information and requesting a return call.  The care management team will reach out to the patient again over the next 7 days.  If patient returns call to provider office, please advise to call [place]  at [place], [place], [place], [place] [address] Direct Dial: [phone] [email or web address]: [email or web address]

## 2019-11-30 ENCOUNTER — Telehealth: Payer: Self-pay | Admitting: Family Medicine

## 2019-11-30 NOTE — Telephone Encounter (Signed)
Pt is out of Ozempic and would like samples until he can get the RX /please advise

## 2019-11-30 NOTE — Chronic Care Management (AMB) (Signed)
  Care Management   Note  11/30/2019 Name: Richard ARIAIL Sr. MRN: [id number] DOB: 1956-01-07  Richard Render Yarbrough Sr. is a 64 y.o. year old male who is a primary care patient of [name], DO and is actively engaged with the care management team. I reached out to [place]. by phone today to assist with re-scheduling a follow up visit with the Pharmacist  Follow up plan: Telephone appointment with care management team member scheduled for:12/18/2019  Noreene Larsson, [place], [place] [facility]  [place], [place] [address] Direct Dial: [phone] [email or web address] Website: [email or web address]

## 2019-12-04 ENCOUNTER — Encounter: Payer: Self-pay | Admitting: Family Medicine

## 2019-12-07 ENCOUNTER — Telehealth: Payer: Self-pay

## 2019-12-09 ENCOUNTER — Other Ambulatory Visit: Payer: BC Managed Care – PPO

## 2019-12-13 ENCOUNTER — Encounter: Payer: Self-pay | Admitting: Urology

## 2019-12-14 ENCOUNTER — Other Ambulatory Visit: Payer: Self-pay | Admitting: *Deleted

## 2019-12-14 ENCOUNTER — Encounter: Payer: Self-pay | Admitting: Urology

## 2019-12-14 DIAGNOSIS — N401 Enlarged prostate with lower urinary tract symptoms: Secondary | ICD-10-CM

## 2019-12-14 DIAGNOSIS — N5201 Erectile dysfunction due to arterial insufficiency: Secondary | ICD-10-CM

## 2019-12-14 MED ORDER — ALLOPURINOL 300 MG PO TABS: 300.0000 mg | ORAL_TABLET | Freq: Every day | ORAL | 3 refills | Status: DC

## 2019-12-15 ENCOUNTER — Ambulatory Visit (INDEPENDENT_AMBULATORY_CARE_PROVIDER_SITE_OTHER): Payer: Medicare HMO | Admitting: Family Medicine

## 2019-12-15 ENCOUNTER — Other Ambulatory Visit: Payer: Self-pay

## 2019-12-15 ENCOUNTER — Encounter: Payer: Self-pay | Admitting: Family Medicine

## 2019-12-15 ENCOUNTER — Other Ambulatory Visit: Payer: Self-pay | Admitting: Family Medicine

## 2019-12-15 VITALS — BP 134/70 | HR 91 | Temp 97.7°F | Resp 16 | Ht 71.0 in | Wt 192.0 lb

## 2019-12-15 DIAGNOSIS — Z23 Encounter for immunization: Secondary | ICD-10-CM

## 2019-12-15 DIAGNOSIS — E1169 Type 2 diabetes mellitus with other specified complication: Secondary | ICD-10-CM

## 2019-12-15 DIAGNOSIS — E1149 Type 2 diabetes mellitus with other diabetic neurological complication: Secondary | ICD-10-CM

## 2019-12-15 DIAGNOSIS — Z0000 Encounter for general adult medical examination without abnormal findings: Secondary | ICD-10-CM

## 2019-12-15 DIAGNOSIS — I10 Essential (primary) hypertension: Secondary | ICD-10-CM

## 2019-12-15 DIAGNOSIS — R69 Illness, unspecified: Secondary | ICD-10-CM | POA: Diagnosis not present

## 2019-12-15 DIAGNOSIS — N138 Other obstructive and reflux uropathy: Secondary | ICD-10-CM

## 2019-12-15 DIAGNOSIS — E785 Hyperlipidemia, unspecified: Secondary | ICD-10-CM

## 2019-12-15 DIAGNOSIS — F339 Major depressive disorder, recurrent, unspecified: Secondary | ICD-10-CM

## 2019-12-15 DIAGNOSIS — E663 Overweight: Secondary | ICD-10-CM

## 2019-12-15 DIAGNOSIS — Z1211 Encounter for screening for malignant neoplasm of colon: Secondary | ICD-10-CM

## 2019-12-15 LAB — POCT GLYCOSYLATED HEMOGLOBIN (HGB A1C): Hemoglobin A1C: 5.8 % — AB (ref 4.0–5.6)

## 2019-12-15 MED ORDER — GLUCOSE BLOOD VI STRP: ORAL_STRIP | 12 refills | Status: DC

## 2019-12-15 MED ORDER — GABAPENTIN 100 MG PO CAPS: ORAL_CAPSULE | ORAL | 1 refills | Status: DC

## 2019-12-15 NOTE — Assessment & Plan Note (Addendum)
Controlled DM at A1c 5.8 on GLP1 and Diet Complications - peripheral neuropathy, other including hyperlipidemia, GERD, depression - increases risk of future cardiovascular complications poor glucose control due to reduced lifestyle diet/exercise with low energy mood and fatigue - Failed Victoza. OFF Glimepiride and NPH Insulin  Plan:  1. CONTINUE dose for Ozepmic up to 1mg  weekly injection - due to receive shipment from PAP Ozempic 1mg  at our office, he will be out this week Thursday but if miss 1 dose we can discuss next week and see if sample available. - Future consider dose inc Ozempic if indicated 2. REDUCE Metformin to 500mg  BID (cut 1000mg  tabs in half now) 2. Encourage improved lifestyle - low carb, low sugar diet, reduce portion size, continue improving regular exercise 3. Check CBG, bring log to next visit for review - For DM Neuropathy start gabapentin titration for symptoms 4. Continue ASA, ACEi Follow-up 6 months

## 2019-12-15 NOTE — Patient Instructions (Addendum)
Thank you for coming to the office today.  Hopefully ozempic will arrive by end of this week and we can call you - otherwise, if not received it by Monday/Tues next week, reach out to [place] to find out if we have it or not and we can work on a sample.  -------------------  Please call your Medicare Insurance Plan to speak with benefits and ask what the cost and coverage is for a "Annual Physical" (performed by your doctor) - Due to changes with Medicare and the Medicare advantage plans, we need to confirm this BEFORE we perform a physical and send out the charge. - All Medicare plans should cover an "Annual Medicare Wellness" (performed by a nurse health advisor, not doctor) - this is FREE and can be done once a year   Recent Labs    06/02/19 0822  HGBA1C 5.8*    REDUCE metformin from 1017m twice a day down to half pill twice a day or 5039m Start Gabapentin 10052mapsules, take at night for 2-3 nights only, and then increase to 2 times a day for a few days, and then may increase to 3 times a day, it may make you drowsy, if helps significantly at night only, then you can increase instead to 3 capsules at night, instead of 3 times a day - In the future if needed, we can significantly increase the dose if tolerated well, some common doses are 300m86mree times a day up to 600mg6mee times a day, usually it takes several weeks or months to get to higher doses   Whenever you are able to schedule - follow up with Dr Bell Gloriann LoanDM eye exam and send us a [place]: [phone]: [phone]-----------------------------  Colon Cancer Screening: - For all adults age 41+ r5+ine colon cancer screening is highly recommended.     - Recent guidelines from [employer] [place] starting age of 45 - 38rly detection of colon cancer is important, because often there are no warning signs or symptoms, also if found early usually it can be  cured. Late stage is hard to treat.  - If you are not interested in Colonoscopy screening (if done and normal you could be cleared for 5 to 10 years until next due), then Cologuard is an excellent alternative for screening test for Colon Cancer. It is highly sensitive for detecting DNA of colon cancer from even the earliest stages. Also, there is NO bowel prep required. - If Cologuard is NEGATIVE, then it is good for 3 years before next due - If Cologuard is POSITIVE, then it is strongly advised to get a Colonoscopy, which allows the GI doctor to locate the source of the cancer or polyp (even very early stage) and treat it by removing it. ------------------------- If you would like to proceed with Cologuard (stool DNA test) - FIRST, call your insurance company and tell them you want to check cost of Cologuard tell them CPT Code [id number] be completely covered and you could get for no cost, OR max cost without any coverage is about $600). Also, keep in mind if you do NOT open the kit, and decide not to do the test, you will NOT be charged, you should contact the company if you decide not to do the test. - If you want to proceed, you can notify us (p[place] message, MyChaJollyat next visit) and  we will order it for you. The test kit will be delivered to you house within about 1 week. Follow instructions to collect sample, you may call the company for any help or questions, 24/7 telephone support at [phone].  DUE for FASTING BLOOD WORK (no food or drink after midnight before the lab appointment, only water or coffee without cream/sugar on the morning of)  SCHEDULE "Lab Only" visit in the morning at the clinic for lab draw in 6 MONTHS   - Make sure Lab Only appointment is at about 1 week before your next appointment, so that results will be available  For Lab Results, once available within 2-3 days of blood draw, you can can log in to MyChart online to view your results and a brief  explanation. Also, we can discuss results at next follow-up visit.    Please schedule a Follow-up Appointment to: Return in about 6 months (around 06/15/2020) for Annual Physical.  If you have any other questions or concerns, please feel free to call the office or send a message through [facility]. You may also schedule an earlier appointment if necessary.  Additionally, you may be receiving a survey about your experience at our office within a few days to 1 week by e-mail or mail. We value your feedback.  Nobie Putnam, DO [place]

## 2019-12-15 NOTE — Progress Notes (Signed)
Subjective:    Patient ID: Richard Columbia Sr., male    DOB: 1955-10-29, 64 y.o.   MRN: [id number]  Richard ROUCH Sr. is a 64 y.o. male presenting on 12/15/2019 for Diabetes   HPI   CHRONIC DM, Type 2 with DM Neuropathy / Overweight BMI >26 Hypertension Last visit 06/2019, see prior note for background. - Last lab A1c 5.6 to 5.8, he has done well since maintained back on ozempic - Today reportshe continues to do very well at this time, he is very pleased with his blood sugars and has been controlling this with med and lifestyle CBGs:Checks CBGs 1-3x daily - avg 120-140, rarely low at 100, high 158 Meds: - Ozempic1mg  weekly (out of med this week Thursday, he is awaiting PAP shipment to our office) -Metformin 1000mg  BID - Failed Victoza in past, OFF Glimepiride, OFF NPH Insulin Reports good compliance. Tolerating well w/o side-effects Currently on ACEi Lifestyle: - Weight down 6-7 lbs - Diet (improved diet overall still) - Exercise (increased activity and exercise) Chronic issue with known,Bilateral feet, mild loss of sensitivity Taking Vitamin B12 Magnesium, MVI, Melatonin.Admits tingling and neuropathy in feet bilateral. Takes OTC medicine for neuropathy. Still has cramping pain in L foot episodic. He has discoloration of great toenails with thickening Request to start Gabapentin therapy for neuropathy as discussed - Last DM Eye exam by Dr Gloriann Loan (2020 - now due in 2021 he will schedule) Denies hypoglycemia, polyuria, visual changes or tingling.  Major Depression, recurrent in remission / Insomnia Reports his mood has been good overall without new concerns. Interval update since last visit started newly on SSRI Sertraline 50mg  daily with great results Continued on medication. He has improved on Trazodone 1.5 pills for 75mg  nightly for insomnia. He feels "perfectly balanced"  Health Maintenance: UTD COVID19 vaccine  Last colonoscopy approx 2008. He has declined in  interval due to ins coverage and other concerns. Previously on prior insurance  -  He checked into it and cost was >$600. Declines to proceed Now on medicare can get cologuard, message when ready  Due for initial pneumonia vaccine at age 82 - will receive Prevnar-13 today   Depression screen [facility] 2/9 12/15/2019 06/10/2019 12/03/2018  Decreased Interest 0 1 1  Down, Depressed, Hopeless 0 1 0  PHQ - 2 Score 0 2 1  Altered sleeping 0 0 1  Tired, decreased energy 0 0 0  Change in appetite 0 0 0  Feeling bad or failure about yourself  0 1 0  Trouble concentrating 1 0 0  Moving slowly or fidgety/restless 0 0 0  Suicidal thoughts 0 0 0  PHQ-9 Score 1 3 2   Difficult doing work/chores Not difficult at all Not difficult at all Somewhat difficult  Some recent data might be hidden   GAD 7 : Generalized Anxiety Score 02/26/2018 11/25/2017  Nervous, Anxious, on Edge 0 3  Control/stop worrying 0 3  Worry too much - different things 0 3  Trouble relaxing 0 2  Restless 0 1  Easily annoyed or irritable 0 1  Afraid - awful might happen 0 2  Total GAD 7 Score 0 15  Anxiety Difficulty Not difficult at all Very difficult     Social History   Tobacco Use  . Smoking status: Former Smoker    Years: 10.00    Quit date: 08/08/1999    Years since quitting: 20.3  . Smokeless tobacco: Former Network engineer Use Topics  . Alcohol use: No  Alcohol/week: 0.0 standard drinks    Comment: Has requested Antabuse at previous PCP visit.   . Drug use: No    Review of Systems Per HPI unless specifically indicated above     Objective:    BP 134/70   Pulse 91   Temp 97.7 F (36.5 C) (Temporal)   Resp 16   Ht 5\' 11"  (1.803 m)   Wt 192 lb (87.1 kg)   SpO2 97%   BMI 26.78 kg/m   Wt Readings from Last 3 Encounters:  12/15/19 192 lb (87.1 kg)  06/11/19 198 lb 14.4 oz (90.2 kg)  06/10/19 198 lb (89.8 kg)    Physical Exam Vitals and nursing note reviewed.  Constitutional:      General: He is not in  acute distress.    Appearance: He is well-developed. He is not diaphoretic.     Comments: Well-appearing, comfortable, cooperative  HENT:     Head: Normocephalic and atraumatic.  Eyes:     General:        Right eye: No discharge.        Left eye: No discharge.     Conjunctiva/sclera: Conjunctivae normal.  Cardiovascular:     Rate and Rhythm: Normal rate.  Pulmonary:     Effort: Pulmonary effort is normal.  Skin:    General: Skin is warm and dry.     Findings: No erythema or rash.  Neurological:     Mental Status: He is alert and oriented to person, place, and time.  Psychiatric:        Behavior: Behavior normal.     Comments: Well groomed, good eye contact, normal speech and thoughts     Recent Labs    06/02/19 0822 12/15/19 0838  HGBA1C 5.8* 5.8*     Results for orders placed or performed in visit on 12/15/19  POCT HgB A1C  Result Value Ref Range   Hemoglobin A1C 5.8 (A) 4.0 - 5.6 %      Assessment & Plan:   Problem List Items Addressed This Visit    Major depression, recurrent, chronic (HCC)    Controlled mood on SSRI and Trazodone Doing much better on SSRI Sertraline 50mg , instead of prior celexa Continue Trazodone 75mg  nightly Follow-up as planned      Essential hypertension    Controlled HTN No known complications     Plan:  1. Continue current BP regimen Benazepril 40mg  daily  2. Encourage improved lifestyle - low sodium diet, regular exercise 3. Continue monitor BP outside office, bring readings to next visit, if persistently >140/90 or new symptoms notify office sooner 4. Follow-up 6 months      DM (diabetes mellitus), type 2 with neurological complications (HCC) - Primary    Controlled DM at A1c 5.8 on GLP1 and Diet Complications - peripheral neuropathy, other including hyperlipidemia, GERD, depression - increases risk of future cardiovascular complications poor glucose control due to reduced lifestyle diet/exercise with low energy mood and  fatigue - Failed Victoza. OFF Glimepiride and NPH Insulin  Plan:  1. CONTINUE dose for Ozepmic up to 1mg  weekly injection - due to receive shipment from PAP Ozempic 1mg  at our office, he will be out this week Thursday but if miss 1 dose we can discuss next week and see if sample available. - Future consider dose inc Ozempic if indicated 2. REDUCE Metformin to 500mg  BID (cut 1000mg  tabs in half now) 2. Encourage improved lifestyle - low carb, low sugar diet, reduce portion size, continue improving  regular exercise 3. Check CBG, bring log to next visit for review - For DM Neuropathy start gabapentin titration for symptoms 4. Continue ASA, ACEi Follow-up 6 months      Relevant Medications   metFORMIN (GLUCOPHAGE) 1000 MG tablet   gabapentin (NEURONTIN) 100 MG capsule   glucose blood (BAYER CONTOUR TEST) test strip   Other Relevant Orders   POCT HgB A1C (Completed)    Other Visit Diagnoses    Need for vaccination with 13-polyvalent pneumococcal conjugate vaccine       Relevant Orders   Pneumococcal conjugate vaccine 13-valent IM (Completed)      - For Age 37+ Prevnar26 today, next 1 year Pneumovax-23 final booster   Meds ordered this encounter  Medications  . gabapentin (NEURONTIN) 100 MG capsule    Sig: Start 1 capsule daily, increase by 1 cap every 2-3 days as tolerated up to 3 times a day, or may take 3 at once in evening.    Dispense:  90 capsule    Refill:  1  . glucose blood (BAYER CONTOUR TEST) test strip    Sig: Use as instructed to check blood sugars twice a day.    Dispense:  100 each    Refill:  12    Bayer Contour test      Follow up plan: Return in about 6 months (around 06/15/2020) for Annual Physical.  Future labs ordered for 06/08/20 - 6 month Annual Physical   Nobie Putnam, DO [facility] 12/15/2019, 8:13 AM

## 2019-12-15 NOTE — Assessment & Plan Note (Signed)
Controlled HTN No known complications     Plan:  1. Continue current BP regimen Benazepril 40mg  daily  2. Encourage improved lifestyle - low sodium diet, regular exercise 3. Continue monitor BP outside office, bring readings to next visit, if persistently >140/90 or new symptoms notify office sooner 4. Follow-up 6 months

## 2019-12-15 NOTE — Assessment & Plan Note (Signed)
Controlled mood on SSRI and Trazodone Doing much better on SSRI Sertraline 50mg , instead of prior celexa Continue Trazodone 75mg  nightly Follow-up as planned

## 2019-12-16 ENCOUNTER — Other Ambulatory Visit: Payer: Self-pay | Admitting: Pharmacy Technician

## 2019-12-16 NOTE — Patient Outreach (Signed)
[facility]) Care Management  12/16/2019  Richard Swingler Neumann Sr. 04/23/1956 [id number]  Care coordination call placed to [place] in regards to patient's Ozempic delivery.  Spoke to [place] who informed that it can take up to 14 business days from 11/26/2019 for the medication to be delivered to the provider's office. She informed the 14th business day would be tomorrow. She was unable to provide me a tracking number today. She also informed they are experiencing some shipping delays. She informed they ship Mon-Tues-Wed and sometimes on Thursdays. She said she would think the medication would be delivered by Monday 12/21/2019. She informed if it is not delivered by Monday, then we could check back with them about a tracking number. She informed they have recently started outreaching patients that the medication has shipped.  Will follow up with either patient or Novo Nordisk in 5-7 business days to inquire if mediation was received/shipped.  Zoeya Gramajo P. Shaasia Odle, [place]  [phone]

## 2019-12-18 ENCOUNTER — Ambulatory Visit: Payer: Medicare HMO | Admitting: Pharmacist

## 2019-12-18 DIAGNOSIS — E1169 Type 2 diabetes mellitus with other specified complication: Secondary | ICD-10-CM

## 2019-12-18 DIAGNOSIS — E1149 Type 2 diabetes mellitus with other diabetic neurological complication: Secondary | ICD-10-CM

## 2019-12-18 NOTE — Patient Instructions (Signed)
Thank you allowing the Chronic Care Management Team to be a part of your care! It was a pleasure speaking with you today!     CCM (Chronic Care Management) Team    Noreene Larsson RN, MSN, CCM Nurse Care Coordinator  [phone]   Harlow Asa PharmD  Clinical Pharmacist  [phone]   Eula Fried LCSW Clinical Social Worker [phone]  Visit Information  Goals Addressed            This Visit's Progress   . PharmD - Medication Assistance       CARE PLAN ENTRY (see longtitudinal plan of care for additional care plan information)  Current Barriers:  . Financial Barriers in complicated patient with multiple medical conditions including HTN, T2DM, HLD, BPH and depression; patient has [employer] and reports copay for Ozempic is cost prohibitive at this time  Pharmacist Clinical Goal(s):  Marland Kitchen Over the next 30 days, patient will work with PharmD and providers to relieve medication access concerns  Interventions: . Collaborated with [facility] CPhT, who lets me know that patient has been approved for patient assistance for Ozempic through [employer] as of 5/20, but medication had not yet shipped o Reports per [employer], medication expected to be shipped to PCP office by 6/14. . Follow up with Mr. Racca regarding medication assistance program. Patient confirms that he received approval letter from [employer]. o Reports that he is out of Ozempic this week (missed dose scheduled for yesterday), but spoke with PCP at office visit on 6/8 and is planning to follow up with clinic next week on Monday or Tuesday about another sample, if supply from assistance program has not arrived. . Discuss patient's blood sugar control and monitoring o Latest A1C on 6/8: 5.8% o Patient taking: - metformin 1000 mg -  tablet twice daily (decreased dose as directed since PCP appointment on 6/8) - Ozempic 1 mg weekly (on Thursdays)- missed one dose on  6/10. Planning to resume next week with supply from assistance program or sample o Reports will continue to monitor home CBGs . Follow up with patient regarding seasonal allergies.  o Reports not currently having allergy symptoms with reduced pollen outside. o Encourage patient if needed in future to use OTC second-generation antihistamine medication such as generic Zyrtec or Claritin for his allergies, rather than diphenhydramine (Benadryl), to reduce risk of sedation, confusion and other anticholinergic effects, particularly given patient's age ? 73. Marland Kitchen Patient with T2DM not currently on statin o From review of office visit note from PCP (06/10/19): "Prior statin failed Simvastatin (stopped due to LFTs, however chart review shows unlikely d/t statin since still elevated >1 year off statin, likely from prior alcohol history)".  o PCP's plan: to reconsider statin . Inter-disciplinary care team collaboration (see longitudinal plan of care) . Will collaborate with PCP  Patient Self Care Activities:  . Patient will provide necessary portions of application  . Patient attends medical appointments as scheduled o Next appointment with PCP on 12/8  Please see past updates related to this goal by clicking on the "Past Updates" button in the selected goal         Patient verbalizes understanding of instructions provided today.   The care management team will reach out to the patient again over the next 30 days.   Harlow Asa, PharmD, [place] [employer] [phone]

## 2019-12-18 NOTE — Chronic Care Management (AMB) (Signed)
Chronic Care Management   Follow Up Note   12/18/2019 Name: Richard LUCCHESI Sr. MRN: [id number] DOB: 1955/07/29  Referred by: [name], DO Reason for referral : Chronic Care Management (Patient Phone Call)   Richard Columbia Sr. is a 64 y.o. year old male who is a primary care patient of [name], DO. The CCM team was consulted for assistance with chronic disease management and care coordination needs.    I reached out to Richard Columbia Sr. by phone today.   Review of patient status, including review of consultants reports, relevant laboratory and other test results, and collaboration with appropriate care team members and the patient's provider was performed as part of comprehensive patient evaluation and provision of chronic care management services.     Outpatient Encounter Medications as of 12/18/2019  Medication Sig Note  . metFORMIN (GLUCOPHAGE) 1000 MG tablet Take 0.5 tablets (500 mg total) by mouth 2 (two) times daily with a meal.   . allopurinol (ZYLOPRIM) 300 MG tablet Take 1 tablet (300 mg total) by mouth daily.   Marland Kitchen aspirin 81 MG tablet Take 1 tablet by mouth daily at 6 (six) AM. 11/06/2014: Received from: [place]:   . benazepril (LOTENSIN) 40 MG tablet Take 1 tablet (40 mg total) by mouth daily at 6 (six) AM.   . Calcium Citrate-Vitamin D (CALCIUM CITRATE + D PO) Take 1 tablet by mouth every evening.   . famotidine (PEPCID) 20 MG tablet Take 20 mg by mouth in the morning.   . gabapentin (NEURONTIN) 100 MG capsule Start 1 capsule daily, increase by 1 cap every 2-3 days as tolerated up to 3 times a day, or may take 3 at once in evening.   Marland Kitchen glucose blood (BAYER CONTOUR TEST) test strip Use as instructed to check blood sugars twice a day.   . ketoconazole (NIZORAL) 2 % shampoo Apply 1 application topically 2 (two) times a week. As needed for scalp dermatitis   . Magnesium Gluconate 500 (27 MG) MG TABS Take 1 tablet by  mouth every evening.  11/06/2014: Received from: [facility]  . melatonin 5 MG TABS Take 1 tablet by mouth at bedtime.    . MULTIPLE VITAMIN PO Take by mouth. 11/06/2014: Received from: [facility]  . OZEMPIC, 1 MG/DOSE, 2 MG/1.5ML SOPN INJECT 1MG  INTO THE SKIN ONCE A WEEK 11/09/2019: On Thursdays  . saw palmetto 160 MG capsule Take 1 capsule (160 mg total) by mouth 2 (two) times daily.   . sertraline (ZOLOFT) 50 MG tablet Take 1 tablet (50 mg total) by mouth daily.   . sildenafil (REVATIO) 20 MG tablet Take 3-5 tablets daily by mouth as needed   . tamsulosin (FLOMAX) 0.4 MG CAPS capsule Take 2 capsules (0.8 mg total) by mouth daily.   . traZODone (DESYREL) 50 MG tablet Take 1.5 tablets (75 mg total) by mouth at bedtime.   . vitamin B-12 (CYANOCOBALAMIN) 1000 MCG tablet Take by mouth.   . zinc gluconate 50 MG tablet Take 50 mg by mouth daily.    No facility-administered encounter medications on file as of 12/18/2019.    Goals Addressed            This Visit's Progress   . PharmD - Medication Assistance       CARE PLAN ENTRY (see longtitudinal plan of care for additional care plan information)  Current Barriers:  . Financial Barriers in complicated patient with multiple medical  conditions including HTN, T2DM, HLD, BPH and depression; patient has [employer] and reports copay for Ozempic is cost prohibitive at this time  Pharmacist Clinical Goal(s):  Marland Kitchen Over the next 30 days, patient will work with PharmD and providers to relieve medication access concerns  Interventions: . Collaborated with [facility], who lets me know that patient has been approved for patient assistance for Ozempic through [employer] as of 5/20, but medication had not yet shipped o Reports per [employer], medication expected to be shipped to PCP office by 6/14. . Follow up with Richard Day regarding medication assistance program. Patient  confirms that he received approval letter from [employer]. o Reports that he is out of Ozempic this week (missed dose scheduled for yesterday), but spoke with PCP at office visit on 6/8 and is planning to follow up with clinic next week on Monday or Tuesday about another sample, if supply from assistance program has not arrived. . Discuss patient's blood sugar control and monitoring o Latest A1C on 6/8: 5.8% o Patient taking: - metformin 1000 mg -  tablet twice daily (decreased dose as directed since PCP appointment on 6/8) - Ozempic 1 mg weekly (on Thursdays)- missed one dose on 6/10. Planning to resume next week with supply from assistance program or sample o Reports will continue to monitor home CBGs . Follow up with patient regarding seasonal allergies.  o Reports not currently having allergy symptoms with reduced pollen outside. o Encourage patient if needed in future to use OTC second-generation antihistamine medication such as generic Zyrtec or Claritin for his allergies, rather than diphenhydramine (Benadryl), to reduce risk of sedation, confusion and other anticholinergic effects, particularly given patient's age ? 59. Marland Kitchen Patient with T2DM not currently on statin o From review of office visit note from PCP (06/10/19): "Prior statin failed Simvastatin (stopped due to LFTs, however chart review shows unlikely d/t statin since still elevated >1 year off statin, likely from prior alcohol history)".  o PCP's plan: to reconsider statin . Inter-disciplinary care team collaboration (see longitudinal plan of care) . Will collaborate with PCP  Patient Self Care Activities:  . Patient will provide necessary portions of application  . Patient attends medical appointments as scheduled o Next appointment with PCP on 12/8  Please see past updates related to this goal by clicking on the "Past Updates" button in the selected goal         Plan  The care management team will reach out to the  patient again over the next 30 days.   Harlow Asa, PharmD, [place] [employer] [phone]

## 2019-12-21 ENCOUNTER — Other Ambulatory Visit: Payer: Self-pay | Admitting: Pharmacy Technician

## 2019-12-21 NOTE — Patient Outreach (Signed)
[facility]) Care Management  12/21/2019  Richard Drinkard Bolda Sr. 1955/12/13 [id number]   Care coordination call placed to [place] in regards to patient's application for Ozempic.  Spoke to Garrett who informed the medication was delivered today to [facility].  Will follow up with patient as previously schedule to inquire if medication was picked up and will route note to embedded [facility] RPh Harlow Asa as Juluis Rainier.  Takara Sermons P. Suresh Audi, [place]  [phone]

## 2019-12-22 ENCOUNTER — Other Ambulatory Visit: Payer: Self-pay | Admitting: Pharmacy Technician

## 2019-12-22 NOTE — Patient Outreach (Signed)
[facility]) Care Management  12/22/2019  Richard Brogden Isidore Sr. May 26, 1956 [id number]   Successful call placed to patient regarding patient assistance medication delivery of Ozempic with [employer], HIPAA identifiers verified.   Patient informed he has picked up the medication from the provider's office. Discussed refill procedure with patient and informed him to outreach provider's office to request they send in a refill to [employer] patient assistance company when he has around 2-4 weeks supply remaining. [facility] does NOT accept refills from patients. The provider's office is sent a fax 80 days after last fill date requesting updated prescription be sent in. COnfirmed patient had name and number.  Follow up:  Will route note to embedded [place] for case closure as patient assistance is completed and will remove myself from care team.  [name], [place]  [phone]

## 2019-12-24 ENCOUNTER — Encounter: Payer: Self-pay | Admitting: Urology

## 2019-12-24 ENCOUNTER — Other Ambulatory Visit: Payer: Self-pay | Admitting: *Deleted

## 2019-12-24 DIAGNOSIS — N401 Enlarged prostate with lower urinary tract symptoms: Secondary | ICD-10-CM

## 2019-12-24 DIAGNOSIS — E1149 Type 2 diabetes mellitus with other diabetic neurological complication: Secondary | ICD-10-CM

## 2019-12-24 DIAGNOSIS — N5201 Erectile dysfunction due to arterial insufficiency: Secondary | ICD-10-CM

## 2019-12-24 MED ORDER — ONETOUCH ULTRA 2 W/DEVICE KIT: PACK | 0 refills | Status: AC

## 2019-12-24 MED ORDER — ONETOUCH ULTRA CONTROL VI SOLN: 5 refills | Status: AC

## 2019-12-24 MED ORDER — ONETOUCH SURESOFT LANCING DEV MISC: 12 refills | Status: AC

## 2019-12-24 MED ORDER — SILDENAFIL CITRATE 20 MG PO TABS: ORAL_TABLET | ORAL | 1 refills | Status: DC

## 2019-12-24 MED ORDER — ONETOUCH ULTRA VI STRP: ORAL_STRIP | 12 refills | Status: DC

## 2019-12-28 DIAGNOSIS — Z1211 Encounter for screening for malignant neoplasm of colon: Secondary | ICD-10-CM | POA: Diagnosis not present

## 2019-12-28 LAB — COLOGUARD
Cologuard: NEGATIVE
Cologuard: NEGATIVE

## 2019-12-30 LAB — EXTERNAL GENERIC LAB PROCEDURE: COLOGUARD: NEGATIVE

## 2019-12-30 LAB — COLOGUARD: COLOGUARD: NEGATIVE

## 2020-01-07 DIAGNOSIS — R69 Illness, unspecified: Secondary | ICD-10-CM | POA: Diagnosis not present

## 2020-01-08 ENCOUNTER — Telehealth: Payer: Self-pay

## 2020-01-08 NOTE — Telephone Encounter (Signed)
Called pt with negative Cologaurd test. Pt verbalized understanding.  KP

## 2020-01-13 ENCOUNTER — Telehealth: Payer: Medicare HMO

## 2020-01-15 ENCOUNTER — Other Ambulatory Visit: Payer: Self-pay | Admitting: Family Medicine

## 2020-01-15 ENCOUNTER — Ambulatory Visit (INDEPENDENT_AMBULATORY_CARE_PROVIDER_SITE_OTHER): Payer: Medicare HMO | Admitting: Pharmacist

## 2020-01-15 DIAGNOSIS — E1149 Type 2 diabetes mellitus with other diabetic neurological complication: Secondary | ICD-10-CM | POA: Diagnosis not present

## 2020-01-15 MED ORDER — METFORMIN HCL 1000 MG PO TABS: 1000.0000 mg | ORAL_TABLET | Freq: Two times a day (BID) | ORAL | 3 refills | Status: DC

## 2020-01-15 NOTE — Chronic Care Management (AMB) (Signed)
Chronic Care Management   Follow Up Note   01/15/2020 Name: Richard Day Sr. MRN: [id number] DOB: [birth year]  Referred by: [name], DO Reason for referral : Chronic Care Management (Patient Phone Call)   [name] is a 64 y.o. year old male who is a primary care patient of [name], DO. The CCM team was consulted for assistance with chronic disease management and care coordination needs.    I reached out to [name] by phone today.   Place call back to patient, but unable to reach patient on either home or mobile numbers and unable to leave message as voicemail is full.  Review of patient status, including review of consultants reports, relevant laboratory and other test results, and collaboration with appropriate care team members and the patient's provider was performed as part of comprehensive patient evaluation and provision of chronic care management services.     Outpatient Encounter Medications as of 01/15/2020  Medication Sig Note  . metFORMIN (GLUCOPHAGE) 1000 MG tablet Take 500 mg by mouth 2 (two) times daily with a meal. Taking 1 tablet QAM and 1/2 tablet QPM   . OZEMPIC, 1 MG/DOSE, 2 MG/1.5ML SOPN INJECT 1MG INTO THE SKIN ONCE A WEEK 11/09/2019: On Thursdays  . allopurinol (ZYLOPRIM) 300 MG tablet Take 1 tablet (300 mg total) by mouth daily.   Marland Kitchen aspirin 81 MG tablet Take 1 tablet by mouth daily at 6 (six) AM. 11/06/2014: Received from: [place]:   . benazepril (LOTENSIN) 40 MG tablet Take 1 tablet (40 mg total) by mouth daily at 6 (six) AM.   . Blood Glucose Calibration (OT ULTRA/FASTTK CNTRL SOLN) SOLN Use as instructed to check blood sugars twice a day.   . Blood Glucose Monitoring Suppl (ONE TOUCH ULTRA 2) w/Device KIT Use as instructed to check blood sugars twice a day.   . Calcium Citrate-Vitamin D (CALCIUM CITRATE + D PO) Take 1 tablet by mouth every evening.   . famotidine (PEPCID) 20 MG  tablet Take 20 mg by mouth in the morning.   . gabapentin (NEURONTIN) 100 MG capsule Start 1 capsule daily, increase by 1 cap every 2-3 days as tolerated up to 3 times a day, or may take 3 at once in evening.   Marland Kitchen ketoconazole (NIZORAL) 2 % shampoo Apply 1 application topically 2 (two) times a week. As needed for scalp dermatitis   . Lancets Misc. (ONE TOUCH SURESOFT) MISC Use as instructed to check blood sugars twice a day.   . Magnesium Gluconate 500 (27 MG) MG TABS Take 1 tablet by mouth every evening.  11/06/2014: Received from: [facility]  . melatonin 5 MG TABS Take 1 tablet by mouth at bedtime.    . MULTIPLE VITAMIN PO Take by mouth. 11/06/2014: Received from: [facility]  . ONETOUCH ULTRA test strip Use as instructed to check blood sugars twice a day.   . saw palmetto 160 MG capsule Take 1 capsule (160 mg total) by mouth 2 (two) times daily.   . sertraline (ZOLOFT) 50 MG tablet Take 1 tablet (50 mg total) by mouth daily.   . sildenafil (REVATIO) 20 MG tablet Take 3-5 tablets daily by mouth as needed   . tamsulosin (FLOMAX) 0.4 MG CAPS capsule Take 2 capsules (0.8 mg total) by mouth daily.   . traZODone (DESYREL) 50 MG tablet Take 1.5 tablets (75 mg total) by mouth at bedtime.   . vitamin B-12 (  CYANOCOBALAMIN) 1000 MCG tablet Take by mouth.   . zinc gluconate 50 MG tablet Take 50 mg by mouth daily.    No facility-administered encounter medications on file as of 01/15/2020.    Goals Addressed            This Visit's Progress   . PharmD - Medication Assistance       CARE PLAN ENTRY (see longtitudinal plan of care for additional care plan information)  Current Barriers:  . Financial Barriers in complicated patient with multiple medical conditions including HTN, T2DM, HLD, BPH and depression; patient has [employer] and reports copay for Ozempic is cost prohibitive at this time  Pharmacist Clinical Goal(s):  Marland Kitchen Over the  next 30 days, patient will work with PharmD and providers to relieve medication access concerns  Interventions: . Received coordination of care message from [place] letting me know she confirmed with patient he has picked up patient assistance program supply of Ozempic from office and has been counseled on program medication refill process. . Discuss patient's blood sugar control and monitoring o Patient taking: - metformin 1000 mg - 1 tablet QAM and 1/2 tablet QPM (decreased dose since last PCP appointment) - Ozempic 1 mg weekly o Reports recent morning fasting CBGs ranging ~140; this morning: 165 - Reports morning fasting CBGs previously ranging 100-120 prior to decreasing dose of metformin. . Patient with T2DM not currently on statin o From review of office visit note from PCP (06/10/19): "Prior statin failed Simvastatin (stopped due to LFTs, however chart review shows unlikely d/t statin since still elevated >1 year off statin, likely from prior alcohol history)".  o Collaborated with PCP - plan to re-evaluate at upcoming visit . Inter-disciplinary care team collaboration (see longitudinal plan of care) . Collaborate with PCP - patient expresses interest in increasing metformin 1000 mg back to 1 tablet twice daily for tighter blood sugar control. o Provider advises fine for patient to resume taking metformin 1000 mg - 1 tablet twice daily - New Rx sent to patient's [place] follow up call to patient, but unable to reach on home or mobile numbers  Patient Self Care Activities:  . Patient will provide necessary portions of application  . Patient attends medical appointments as scheduled o Next appointment with PCP on 12/8  Please see past updates related to this goal by clicking on the "Past Updates" button in the selected goal         Plan  The care management team will reach out to the patient again over the next 14 days.   Harlow Asa,  PharmD, [place] [employer] [phone]

## 2020-01-15 NOTE — Patient Instructions (Signed)
Thank you allowing the Chronic Care Management Team to be a part of your care! It was a pleasure speaking with you today!     CCM (Chronic Care Management) Team    Noreene Larsson RN, MSN, CCM Nurse Care Coordinator  [phone]   Harlow Asa PharmD  Clinical Pharmacist  [phone]   Eula Fried LCSW Clinical Social Worker [phone]  Visit Information  Goals Addressed            This Visit's Progress   . PharmD - Medication Assistance       CARE PLAN ENTRY (see longtitudinal plan of care for additional care plan information)  Current Barriers:  . Financial Barriers in complicated patient with multiple medical conditions including HTN, T2DM, HLD, BPH and depression; patient has [employer] and reports copay for Ozempic is cost prohibitive at this time  Pharmacist Clinical Goal(s):  Marland Kitchen Over the next 30 days, patient will work with PharmD and providers to relieve medication access concerns  Interventions: . Received coordination of care message from [place] Simcox letting me know she confirmed with patient he has picked up patient assistance program supply of Ozempic from office and has been counseled on program medication refill process. . Discuss patient's blood sugar control and monitoring o Patient taking: - metformin 1000 mg - 1 tablet QAM and 1/2 tablet QPM (decreased dose since last PCP appointment) - Ozempic 1 mg weekly o Reports recent morning fasting CBGs ranging ~140; this morning: 165 - Reports morning fasting CBGs previously ranging 100-120 prior to decreasing dose of metformin. . Patient with T2DM not currently on statin o From review of office visit note from PCP (06/10/19): "Prior statin failed Simvastatin (stopped due to LFTs, however chart review shows unlikely d/t statin since still elevated >1 year off statin, likely from prior alcohol history)".  o Collaborated with PCP - plan to re-evaluate at upcoming  visit . Inter-disciplinary care team collaboration (see longitudinal plan of care) . Collaborate with PCP - patient expresses interest in increasing metformin 1000 mg back to 1 tablet twice daily for tighter blood sugar control. o Provider advises fine for patient to resume taking metformin 1000 mg - 1 tablet twice daily - New Rx sent to patient's [place] follow up call to patient, but unable to reach on home or mobile numbers  Patient Self Care Activities:  . Patient will provide necessary portions of application  . Patient attends medical appointments as scheduled o Next appointment with PCP on 12/8  Please see past updates related to this goal by clicking on the "Past Updates" button in the selected goal         Patient verbalizes understanding of instructions provided today.   The care management team will reach out to the patient again over the next 14 days.   Harlow Asa, PharmD, [place] [employer] [phone]

## 2020-01-18 ENCOUNTER — Ambulatory Visit: Payer: Medicare HMO | Admitting: Pharmacist

## 2020-01-18 DIAGNOSIS — E1149 Type 2 diabetes mellitus with other diabetic neurological complication: Secondary | ICD-10-CM

## 2020-01-18 NOTE — Patient Instructions (Signed)
Thank you allowing the Chronic Care Management Team to be a part of your care! It was a pleasure speaking with you today!     CCM (Chronic Care Management) Team    Noreene Larsson RN, MSN, CCM Nurse Care Coordinator  [phone]   Harlow Asa PharmD  Clinical Pharmacist  [phone]   Eula Fried LCSW Clinical Social Worker [phone]  Visit Information  Goals Addressed            This Visit's Progress    PharmD - Medication Assistance       CARE PLAN ENTRY (see longtitudinal plan of care for additional care plan information)  Current Barriers:   Financial Barriers in complicated patient with multiple medical conditions including HTN, T2DM, HLD, BPH and depression; patient has [employer] and reports copay for Ozempic is cost prohibitive at this time  Pharmacist Clinical Goal(s):   Over the next 30 days, patient will work with PharmD and providers to relieve medication access concerns  Interventions:  Follow up with patient - advise PCP okay with increase of metformin 1000 mg back to 1 tablet twice daily  o New Rx sent to patient's [place] o Patient verbalizes understanding  Address patient's questions regarding Ozempic patient assistance program - Advise patient to contact office to request they send in a refill to [employer] patient assistance company when he has around 2-4 weeks supply remaining.  Patient Self Care Activities:   Patient will provide necessary portions of application   Patient attends medical appointments as scheduled o Next appointment with PCP on 12/8  Please see past updates related to this goal by clicking on the "Past Updates" button in the selected goal         Patient verbalizes understanding of instructions provided today.   The care management team will reach out to the patient again over the next 6 months.  Harlow Asa, PharmD, [place] [employer] [phone]

## 2020-01-18 NOTE — Chronic Care Management (AMB) (Signed)
Chronic Care Management   Follow Up Note   01/18/2020 Name: Richard SIMKINS Sr. MRN: [id number] DOB: 08-01-1955  Referred by: [name], DO Reason for referral : Chronic Care Management (Patient Phone Call)   Richard Columbia Sr. is a 64 y.o. year old male who is a primary care patient of [name], DO. The CCM team was consulted for assistance with chronic disease management and care coordination needs.    I reached out to Richard Columbia Sr. by phone today.   Review of patient status, including review of consultants reports, relevant laboratory and other test results, and collaboration with appropriate care team members and the patient's provider was performed as part of comprehensive patient evaluation and provision of chronic care management services.     Outpatient Encounter Medications as of 01/18/2020  Medication Sig Note  . allopurinol (ZYLOPRIM) 300 MG tablet Take 1 tablet (300 mg total) by mouth daily.   Marland Kitchen aspirin 81 MG tablet Take 1 tablet by mouth daily at 6 (six) AM. 11/06/2014: Received from: [place]:   . benazepril (LOTENSIN) 40 MG tablet Take 1 tablet (40 mg total) by mouth daily at 6 (six) AM.   . Blood Glucose Calibration (OT ULTRA/FASTTK CNTRL SOLN) SOLN Use as instructed to check blood sugars twice a day.   . Blood Glucose Monitoring Suppl (ONE TOUCH ULTRA 2) w/Device KIT Use as instructed to check blood sugars twice a day.   . Calcium Citrate-Vitamin D (CALCIUM CITRATE + D PO) Take 1 tablet by mouth every evening.   . famotidine (PEPCID) 20 MG tablet Take 20 mg by mouth in the morning.   . gabapentin (NEURONTIN) 100 MG capsule Start 1 capsule daily, increase by 1 cap every 2-3 days as tolerated up to 3 times a day, or may take 3 at once in evening.   Marland Kitchen ketoconazole (NIZORAL) 2 % shampoo Apply 1 application topically 2 (two) times a week. As needed for scalp dermatitis   . Lancets Misc. (ONE TOUCH SURESOFT)  MISC Use as instructed to check blood sugars twice a day.   . Magnesium Gluconate 500 (27 MG) MG TABS Take 1 tablet by mouth every evening.  11/06/2014: Received from: [facility]  . melatonin 5 MG TABS Take 1 tablet by mouth at bedtime.    . metFORMIN (GLUCOPHAGE) 1000 MG tablet Take 1 tablet (1,000 mg total) by mouth 2 (two) times daily with a meal.   . MULTIPLE VITAMIN PO Take by mouth. 11/06/2014: Received from: [facility]  . ONETOUCH ULTRA test strip Use as instructed to check blood sugars twice a day.   Marland Kitchen OZEMPIC, 1 MG/DOSE, 2 MG/1.5ML SOPN INJECT '1MG'$  INTO THE SKIN ONCE A WEEK 11/09/2019: On Thursdays  . saw palmetto 160 MG capsule Take 1 capsule (160 mg total) by mouth 2 (two) times daily.   . sertraline (ZOLOFT) 50 MG tablet Take 1 tablet (50 mg total) by mouth daily.   . sildenafil (REVATIO) 20 MG tablet Take 3-5 tablets daily by mouth as needed   . tamsulosin (FLOMAX) 0.4 MG CAPS capsule Take 2 capsules (0.8 mg total) by mouth daily.   . traZODone (DESYREL) 50 MG tablet Take 1.5 tablets (75 mg total) by mouth at bedtime.   . vitamin B-12 (CYANOCOBALAMIN) 1000 MCG tablet Take by mouth.   . zinc gluconate 50 MG tablet Take 50 mg by mouth daily.    No facility-administered encounter medications on file as  of 01/18/2020.    Goals Addressed            This Visit's Progress   . PharmD - Medication Assistance       CARE PLAN ENTRY (see longtitudinal plan of care for additional care plan information)  Current Barriers:  . Financial Barriers in complicated patient with multiple medical conditions including HTN, T2DM, HLD, BPH and depression; patient has [employer] and reports copay for Ozempic is cost prohibitive at this time  Pharmacist Clinical Goal(s):  Marland Kitchen Over the next 30 days, patient will work with PharmD and providers to relieve medication access concerns  Interventions: . Follow up with patient - advise PCP  okay with increase of metformin 1000 mg back to 1 tablet twice daily  o New Rx sent to patient's [place] o Patient verbalizes understanding . Address patient's questions regarding Ozempic patient assistance program - Advise patient to contact office to request they send in a refill to [employer] patient assistance company when he has around 2-4 weeks supply remaining.  Patient Self Care Activities:  . Patient will provide necessary portions of application  . Patient attends medical appointments as scheduled o Next appointment with PCP on 12/8  Please see past updates related to this goal by clicking on the "Past Updates" button in the selected goal         Plan  Patient denies further medication questions/concerns at this time. Per patient request, the care management team will reach out to the patient again in 6 months for medication assistance follow up  Richard Day, PharmD, [place] [phone]

## 2020-01-20 DIAGNOSIS — E113393 Type 2 diabetes mellitus with moderate nonproliferative diabetic retinopathy without macular edema, bilateral: Secondary | ICD-10-CM | POA: Diagnosis not present

## 2020-02-24 ENCOUNTER — Other Ambulatory Visit: Payer: Self-pay | Admitting: Family Medicine

## 2020-02-24 DIAGNOSIS — E1149 Type 2 diabetes mellitus with other diabetic neurological complication: Secondary | ICD-10-CM

## 2020-02-24 NOTE — Telephone Encounter (Signed)
Requested medication (s) are due for refill today: yes  Requested medication (s) are on the active medication list: yes  Last refill: 12/15/19  #90  1 refill  Future visit scheduled: yes  Notes to clinic:  Starting instructions on Sig Needs changed    Requested Prescriptions  Pending Prescriptions Disp Refills   gabapentin (NEURONTIN) 100 MG capsule [Pharmacy Med Name: GABAPENTIN 100 MG CAPSULE] 90 capsule 1    Sig: START WITH ONE CAPSULE DAILY.  INCREASE BY ONE CAPSULE EVERY 2-3 DAYS AS TOLERATED UP TO THREE TIMES DAILY. OR MAY TAKE THREE CAPSULES AT ONCE IN THE EVENING      Neurology: Anticonvulsants - gabapentin Passed - 02/24/2020  1:34 PM      Passed - Valid encounter within last 12 months    Recent Outpatient Visits           2 months ago DM (diabetes mellitus), type 2 with neurological complications [place])   [place], DO   8 months ago Annual physical exam   [place], [place]   1 year ago DM (diabetes mellitus), type 2 with neurological complications [facility])   [place], DO   1 year ago Annual physical exam   [facility] [name], DO   1 year ago DM (diabetes mellitus), type 2 with neurological complications [facility])   [facility] [name], DO       Future Appointments             In 3 months [place], [name], [facility], [place]   In 3 months [place], [name], [facility]

## 2020-03-31 DIAGNOSIS — E1149 Type 2 diabetes mellitus with other diabetic neurological complication: Secondary | ICD-10-CM

## 2020-04-21 ENCOUNTER — Other Ambulatory Visit: Payer: Self-pay | Admitting: Urology

## 2020-04-21 ENCOUNTER — Other Ambulatory Visit: Payer: Self-pay | Admitting: Family Medicine

## 2020-04-21 DIAGNOSIS — N5201 Erectile dysfunction due to arterial insufficiency: Secondary | ICD-10-CM

## 2020-04-21 DIAGNOSIS — E1149 Type 2 diabetes mellitus with other diabetic neurological complication: Secondary | ICD-10-CM

## 2020-04-21 DIAGNOSIS — R350 Frequency of micturition: Secondary | ICD-10-CM

## 2020-04-21 DIAGNOSIS — N401 Enlarged prostate with lower urinary tract symptoms: Secondary | ICD-10-CM

## 2020-04-21 NOTE — Telephone Encounter (Signed)
Requested Prescriptions  Pending Prescriptions Disp Refills  . gabapentin (NEURONTIN) 100 MG capsule [Pharmacy Med Name: GABAPENTIN 100 MG CAPSULE] 90 capsule 1    Sig: TAKE ONE CAPSULE BY MOUTH THREE TIMES A DAY     Neurology: Anticonvulsants - gabapentin Passed - 04/21/2020  1:35 PM      Passed - Valid encounter within last 12 months    Recent Outpatient Visits          4 months ago DM (diabetes mellitus), type 2 with neurological complications [facility])   [place], DO   10 months ago Annual physical exam   [facility] [name], DO   1 year ago DM (diabetes mellitus), type 2 with neurological complications [facility])   [place], DO   1 year ago Annual physical exam   [facility] [name], DO   2 years ago DM (diabetes mellitus), type 2 with neurological complications [facility])   [facility] [name], DO      Future Appointments            In 1 month [place], [name], [facility], [place]   In 1 month [place], [name], [facility]

## 2020-04-27 ENCOUNTER — Ambulatory Visit (INDEPENDENT_AMBULATORY_CARE_PROVIDER_SITE_OTHER): Payer: Medicare HMO | Admitting: Pharmacist

## 2020-04-27 DIAGNOSIS — E1149 Type 2 diabetes mellitus with other diabetic neurological complication: Secondary | ICD-10-CM

## 2020-04-27 NOTE — Patient Instructions (Signed)
Thank you allowing the Chronic Care Management Team to be a part of your care! It was a pleasure speaking with you today!     CCM (Chronic Care Management) Team    Noreene Larsson RN, MSN, CCM Nurse Care Coordinator  [phone]   Harlow Asa PharmD  Clinical Pharmacist  [phone]   Eula Fried LCSW Clinical Social Worker [phone]  Visit Information  Goals Addressed            This Visit's Progress    PharmD - Medication Assistance       CARE PLAN ENTRY (see longtitudinal plan of care for additional care plan information)  Current Barriers:   Financial Barriers in complicated patient with multiple medical conditions including HTN, T2DM, HLD, BPH and depression; patient has [employer] and reports copay for Ozempic is cost prohibitive at this time  Pharmacist Clinical Goal(s):   Over the next 30 days, patient will work with PharmD and providers to relieve medication access concerns  Interventions:  Receive coordination of care message from PCP. Office has not yet received shipment of Ozempic from [employer], despite office faxing refill request to program on 9/28  Follow up with Mr. Shugars. Patient reports he is currently out of Ozempic, missed dose last Thursday as refill had not arrived  Place coordination of care call to [employer]. Speak with representative Olivia Mackie, who reports refill shipped on 10/4, but with review of tracking details identifies that medication refill was shipped to incorrect address, but unsure why o Confirm correct shipping address for patient, as listed on original application and refill request o Olivia Mackie states medication will be reshipped to correct address within 14 business days o Request immediate supply to patient as he is currently out of medication.  - Olivia Mackie states patient can call into [employer] to receive information for immediate supply voucher and/or program will reach  out to him within the next 24 hours with this voucher information  Follow up with patient again to provide information as received from [employer]. Confirm that patient has correct phone number for [employer] assistance program [phone]) so that he can return call or await call back about voucher for immediate supply  Schedule follow up with patient in January regarding assistance program re-enrollment  Will collaborate with PCP to provide update  Patient Self Care Activities:   Patient will provide necessary portions of application   Patient attends medical appointments as scheduled o Next appointment with PCP on 12/8  Please see past updates related to this goal by clicking on the "Past Updates" button in the selected goal         Patient verbalizes understanding of instructions provided today.   Telephone follow up appointment with care management team member scheduled for: 1/3 at 9:30 am  Harlow Asa, PharmD, [place] [phone]

## 2020-04-27 NOTE — Chronic Care Management (AMB) (Signed)
Chronic Care Management   Follow Up Note   04/27/2020 Name: KRISHNA DANCEL Sr. MRN: [id number] DOB: 09-20-1955  Referred by: [name], DO Reason for referral : Chronic Care Management (Patient Phone Call) and Care Coordination ([place] Patient Assistance Program)   [name] is a 64 y.o. year old male who is a primary care patient of [name], DO. The CCM team was consulted for assistance with chronic disease management and care coordination needs.    I reached out to [name] by phone today.   Coordination of care call to [employer] patient assistance program  Review of patient status, including review of consultants reports, relevant laboratory and other test results, and collaboration with appropriate care team members and the patient's provider was performed as part of comprehensive patient evaluation and provision of chronic care management services.    SDOH (Social Determinants of Health) assessments performed: No See Care Plan activities for detailed interventions related to [facility])     Outpatient Encounter Medications as of 04/27/2020  Medication Sig Note  . allopurinol (ZYLOPRIM) 300 MG tablet Take 1 tablet (300 mg total) by mouth daily.   Marland Kitchen aspirin 81 MG tablet Take 1 tablet by mouth daily at 6 (six) AM. 11/06/2014: Received from: [place]:   . benazepril (LOTENSIN) 40 MG tablet Take 1 tablet (40 mg total) by mouth daily at 6 (six) AM.   . Blood Glucose Calibration (OT ULTRA/FASTTK CNTRL SOLN) SOLN Use as instructed to check blood sugars twice a day.   . Blood Glucose Monitoring Suppl (ONE TOUCH ULTRA 2) w/Device KIT Use as instructed to check blood sugars twice a day.   . Calcium Citrate-Vitamin D (CALCIUM CITRATE + D PO) Take 1 tablet by mouth every evening.   . famotidine (PEPCID) 20 MG tablet Take 20 mg by mouth in the morning.   . gabapentin (NEURONTIN) 100 MG capsule TAKE ONE  CAPSULE BY MOUTH THREE TIMES A DAY   . ketoconazole (NIZORAL) 2 % shampoo Apply 1 application topically 2 (two) times a week. As needed for scalp dermatitis   . Lancets Misc. (ONE TOUCH SURESOFT) MISC Use as instructed to check blood sugars twice a day.   . Magnesium Gluconate 500 (27 MG) MG TABS Take 1 tablet by mouth every evening.  11/06/2014: Received from: [employer]  . melatonin 5 MG TABS Take 1 tablet by mouth at bedtime.    . metFORMIN (GLUCOPHAGE) 1000 MG tablet Take 1 tablet (1,000 mg total) by mouth 2 (two) times daily with a meal.   . MULTIPLE VITAMIN PO Take by mouth. 11/06/2014: Received from: [employer]  . ONETOUCH ULTRA test strip Use as instructed to check blood sugars twice a day.   Marland Kitchen OZEMPIC, 1 MG/DOSE, 4 MG/3ML SOPN Inject 1 mg into the skin once a week.   . saw palmetto 160 MG capsule Take 1 capsule (160 mg total) by mouth 2 (two) times daily.   . sertraline (ZOLOFT) 50 MG tablet Take 1 tablet (50 mg total) by mouth daily.   . sildenafil (REVATIO) 20 MG tablet Take 3-5 tablets daily by mouth as needed   . tamsulosin (FLOMAX) 0.4 MG CAPS capsule TAKE TWO CAPSULES BY MOUTH DAILY   . traZODone (DESYREL) 50 MG tablet Take 1.5 tablets (75 mg total) by mouth at bedtime.   . vitamin B-12 (CYANOCOBALAMIN) 1000 MCG tablet Take by mouth.   . zinc gluconate 50  MG tablet Take 50 mg by mouth daily.    No facility-administered encounter medications on file as of 04/27/2020.    Goals Addressed            This Visit's Progress   . PharmD - Medication Assistance       CARE PLAN ENTRY (see longtitudinal plan of care for additional care plan information)  Current Barriers:  . Financial Barriers in complicated patient with multiple medical conditions including HTN, T2DM, HLD, BPH and depression; patient has [employer] and reports copay for Ozempic is cost prohibitive at this time  Pharmacist Clinical Goal(s):   Marland Kitchen Over the next 30 days, patient will work with PharmD and providers to relieve medication access concerns  Interventions: . Receive coordination of care message from PCP. Office has not yet received shipment of Ozempic from [employer], despite office faxing refill request to program on 9/28 . Follow up with Mr. Bellanca. Patient reports he is currently out of Ozempic, missed dose last Thursday as refill had not arrived . Place coordination of care call to [employer]. Speak with representative Olivia Mackie, who reports refill shipped on 10/4, but with review of tracking details identifies that medication refill was shipped to incorrect address, but unsure why o Confirm correct shipping address for patient, as listed on original application and refill request o Olivia Mackie states medication will be reshipped to correct address within 14 business days o Request immediate supply to patient as he is currently out of medication.  - Olivia Mackie states patient can call into [employer] to receive information for immediate supply voucher and/or program will reach out to him within the next 24 hours with this voucher information . Follow up with patient again to provide information as received from [employer]. Confirm that patient has correct phone number for [employer] assistance program [phone]) so that he can return call or await call back about voucher for immediate supply . Schedule follow up with patient in January regarding assistance program re-enrollment . Will collaborate with PCP to provide update  Patient Self Care Activities:  . Patient will provide necessary portions of application  . Patient attends medical appointments as scheduled o Next appointment with PCP on 12/8  Please see past updates related to this goal by clicking on the "Past Updates" button in the selected goal         Plan  Telephone follow up appointment with care management team member scheduled for:  1/3 at 9:30 am  Harlow Asa, PharmD, [place] [phone]

## 2020-04-29 MED ORDER — OZEMPIC (1 MG/DOSE) 4 MG/3ML ~~LOC~~ SOPN: 1.0000 mg | PEN_INJECTOR | SUBCUTANEOUS | 0 refills | Status: DC

## 2020-04-29 NOTE — Addendum Note (Signed)
Addended by: [name] on: 04/29/2020 05:44 PM   Modules accepted: Orders

## 2020-05-06 ENCOUNTER — Ambulatory Visit: Payer: Self-pay | Admitting: Pharmacist

## 2020-05-06 DIAGNOSIS — E1149 Type 2 diabetes mellitus with other diabetic neurological complication: Secondary | ICD-10-CM | POA: Diagnosis not present

## 2020-05-06 NOTE — Chronic Care Management (AMB) (Signed)
Chronic Care Management   Follow Up Note   05/06/2020 Name: Richard WEBER Sr. MRN: [id number] DOB: 11/26/1955  Referred by: [name], DO Reason for referral : Care Coordination ([facility])   Richard Columbia Sr. is a 64 y.o. year old male who is a primary care patient of [name], DO. The CCM team was consulted for assistance with chronic disease management and care coordination needs.    I reached out to Richard Columbia Sr. by phone today.   Coordination of care call to [employer] patient assistance program  Review of patient status, including review of consultants reports, relevant laboratory and other test results, and collaboration with appropriate care team members and the patient's provider was performed as part of comprehensive patient evaluation and provision of chronic care management services.    SDOH (Social Determinants of Health) assessments performed: No See Care Plan activities for detailed interventions related to [facility])     Outpatient Encounter Medications as of 05/06/2020  Medication Sig Note  . allopurinol (ZYLOPRIM) 300 MG tablet Take 1 tablet (300 mg total) by mouth daily.   Marland Kitchen aspirin 81 MG tablet Take 1 tablet by mouth daily at 6 (six) AM. 11/06/2014: Received from: [place]:   . benazepril (LOTENSIN) 40 MG tablet Take 1 tablet (40 mg total) by mouth daily at 6 (six) AM.   . Blood Glucose Calibration (OT ULTRA/FASTTK CNTRL SOLN) SOLN Use as instructed to check blood sugars twice a day.   . Blood Glucose Monitoring Suppl (ONE TOUCH ULTRA 2) w/Device KIT Use as instructed to check blood sugars twice a day.   . Calcium Citrate-Vitamin D (CALCIUM CITRATE + D PO) Take 1 tablet by mouth every evening.   . famotidine (PEPCID) 20 MG tablet Take 20 mg by mouth in the morning.   . gabapentin (NEURONTIN) 100 MG capsule TAKE ONE CAPSULE BY MOUTH THREE TIMES A DAY   . ketoconazole (NIZORAL) 2  % shampoo Apply 1 application topically 2 (two) times a week. As needed for scalp dermatitis   . Lancets Misc. (ONE TOUCH SURESOFT) MISC Use as instructed to check blood sugars twice a day.   . Magnesium Gluconate 500 (27 MG) MG TABS Take 1 tablet by mouth every evening.  11/06/2014: Received from: [employer]  . melatonin 5 MG TABS Take 1 tablet by mouth at bedtime.    . metFORMIN (GLUCOPHAGE) 1000 MG tablet Take 1 tablet (1,000 mg total) by mouth 2 (two) times daily with a meal.   . MULTIPLE VITAMIN PO Take by mouth. 11/06/2014: Received from: [employer]  . ONETOUCH ULTRA test strip Use as instructed to check blood sugars twice a day.   Marland Kitchen OZEMPIC, 1 MG/DOSE, 4 MG/3ML SOPN Inject 1 mg into the skin once a week.   Marland Kitchen OZEMPIC, 1 MG/DOSE, 4 MG/3ML SOPN Inject 1 mg into the skin once a week.   . saw palmetto 160 MG capsule Take 1 capsule (160 mg total) by mouth 2 (two) times daily.   . sertraline (ZOLOFT) 50 MG tablet Take 1 tablet (50 mg total) by mouth daily.   . sildenafil (REVATIO) 20 MG tablet Take 3-5 tablets daily by mouth as needed   . tamsulosin (FLOMAX) 0.4 MG CAPS capsule TAKE TWO CAPSULES BY MOUTH DAILY   . traZODone (DESYREL) 50 MG tablet Take 1.5 tablets (75 mg total) by mouth at bedtime.   . vitamin B-12 (CYANOCOBALAMIN) 1000 MCG tablet  Take by mouth.   . zinc gluconate 50 MG tablet Take 50 mg by mouth daily.    No facility-administered encounter medications on file as of 05/06/2020.    Goals Addressed            This Visit's Progress   . PharmD - Medication Assistance       CARE PLAN ENTRY (see longtitudinal plan of care for additional care plan information)  Current Barriers:  . Financial Barriers in complicated patient with multiple medical conditions including HTN, T2DM, HLD, BPH and depression; patient has [employer] and reports copay for Ozempic is cost prohibitive at this time  Pharmacist Clinical  Goal(s):  Marland Kitchen Over the next 30 days, patient will work with PharmD and providers to relieve medication access concerns  Interventions: . [employer] on 10/20 and spoke with representative Olivia Mackie, who reported refill shipped on 10/4, but with review of tracking details identifies that medication refill was shipped to incorrect address, but unsure why o Confirmed correct shipping address for patient, as listed on original application and refill request and Olivia Mackie stated medication would be reshipped to correct address within 14 business days . Today receive coordination of care call from Elmer Picker with [facility]. Anne Ng reports clinic received shipment of [facility] patient assistance program supply of Ozempic.  o Reports clinic has properly stored the medication as originally delivered in their fridge but are unable to dispense it to [name] directly as he is not their patient.  Lequita Asal plans to reach out to our clinic today about delivering the medication to our office. . Collaborate with Rachell Burnett and Dione Booze in out office to let them know. o Rachell advises that she has coordinated with Anne Ng to receive the medication today and has notified patient about when the medication can be picked up. . Follow up with [employer] assistance program today to request an investigation of this issue/address and provider name mis-match. o Speak with Celita who states that supply received by [facility] (address [address], [place], [place] [address]) was the previous supply sent to the patient. Reports that the address has been updated in their system, so office/patient should still expect to receive the replacement shipment between 11/4-11/10 . Follow up with patient to provide him with this information from [employer] program  Patient Self Care Activities:  . Patient will provide necessary portions of application   . Patient attends medical appointments as scheduled o Next appointment with PCP on 12/8  Please see past updates related to this goal by clicking on the "Past Updates" button in the selected goal         Plan  Telephone follow up appointment with care management team member scheduled for: 1/3 at 9:30 am  Harlow Asa, PharmD, [place] [phone]

## 2020-05-06 NOTE — Patient Instructions (Signed)
Thank you allowing the Chronic Care Management Team to be a part of your care! It was a pleasure speaking with you today!     CCM (Chronic Care Management) Team    Noreene Larsson RN, MSN, CCM Nurse Care Coordinator  [phone]   Harlow Asa PharmD  Clinical Pharmacist  [phone]   Eula Fried LCSW Clinical Social Worker [phone]  Visit Information  Goals Addressed            This Visit's Progress   . PharmD - Medication Assistance       CARE PLAN ENTRY (see longtitudinal plan of care for additional care plan information)  Current Barriers:  . Financial Barriers in complicated patient with multiple medical conditions including HTN, T2DM, HLD, BPH and depression; patient has [employer] and reports copay for Ozempic is cost prohibitive at this time  Pharmacist Clinical Goal(s):  Marland Kitchen Over the next 30 days, patient will work with PharmD and providers to relieve medication access concerns  Interventions: . [employer] on 10/20 and spoke with representative Olivia Mackie, who reported refill shipped on 10/4, but with review of tracking details identifies that medication refill was shipped to incorrect address, but unsure why o Confirmed correct shipping address for patient, as listed on original application and refill request and Olivia Mackie stated medication would be reshipped to correct address within 14 business days . Today receive coordination of care call from Elmer Picker with [facility]. Anne Ng reports clinic received shipment of [facility] patient assistance program supply of Ozempic.  o Reports clinic has properly stored the medication as originally delivered in their fridge but are unable to dispense it to Mr. Cuppett directly as he is not their patient.  Lequita Asal plans to reach out to our clinic today about delivering the medication to our office. . Collaborate with Rachell Burnett and  Dione Booze in out office to let them know. o Rachell advises that she has coordinated with Anne Ng to receive the medication today and has notified patient about when the medication can be picked up. . Follow up with [employer] assistance program today to request an investigation of this issue/address and provider name mis-match. o Speak with Celita who states that supply received by [facility] (address [address], [place], [place] [address]) was the previous supply sent to the patient. Reports that the address has been updated in their system, so office/patient should still expect to receive the replacement shipment between 11/4-11/10 . Follow up with patient to provide him with this information from [employer] program  Patient Self Care Activities:  . Patient will provide necessary portions of application  . Patient attends medical appointments as scheduled o Next appointment with PCP on 12/8  Please see past updates related to this goal by clicking on the "Past Updates" button in the selected goal         Patient verbalizes understanding of instructions provided today.   Telephone follow up appointment with care management team member scheduled for: 1/3  Harlow Asa, PharmD, [place] [phone]

## 2020-06-08 ENCOUNTER — Other Ambulatory Visit: Payer: Self-pay

## 2020-06-08 ENCOUNTER — Other Ambulatory Visit: Payer: Medicare HMO

## 2020-06-08 DIAGNOSIS — E663 Overweight: Secondary | ICD-10-CM | POA: Diagnosis not present

## 2020-06-08 DIAGNOSIS — I10 Essential (primary) hypertension: Secondary | ICD-10-CM | POA: Diagnosis not present

## 2020-06-08 DIAGNOSIS — R69 Illness, unspecified: Secondary | ICD-10-CM | POA: Diagnosis not present

## 2020-06-08 DIAGNOSIS — E1149 Type 2 diabetes mellitus with other diabetic neurological complication: Secondary | ICD-10-CM | POA: Diagnosis not present

## 2020-06-08 DIAGNOSIS — N401 Enlarged prostate with lower urinary tract symptoms: Secondary | ICD-10-CM | POA: Diagnosis not present

## 2020-06-08 DIAGNOSIS — E785 Hyperlipidemia, unspecified: Secondary | ICD-10-CM | POA: Diagnosis not present

## 2020-06-08 DIAGNOSIS — Z0000 Encounter for general adult medical examination without abnormal findings: Secondary | ICD-10-CM

## 2020-06-08 DIAGNOSIS — N138 Other obstructive and reflux uropathy: Secondary | ICD-10-CM | POA: Diagnosis not present

## 2020-06-08 DIAGNOSIS — F339 Major depressive disorder, recurrent, unspecified: Secondary | ICD-10-CM

## 2020-06-08 DIAGNOSIS — E1169 Type 2 diabetes mellitus with other specified complication: Secondary | ICD-10-CM | POA: Diagnosis not present

## 2020-06-09 LAB — LIPID PANEL
Cholesterol: 190 mg/dL (ref <200)
HDL: 43 mg/dL (ref >=40)
LDL Cholesterol (Calc): 124 mg/dL (calc) — ABNORMAL HIGH
Non-HDL Cholesterol (Calc): 147 mg/dL (calc) — ABNORMAL HIGH (ref <130)
Total CHOL/HDL Ratio: 4.4 (calc) (ref <5.0)
Triglycerides: 118 mg/dL (ref <150)

## 2020-06-09 LAB — COMPLETE METABOLIC PANEL WITH GFR
AG Ratio: 1.3 (calc) (ref 1.0–2.5)
ALT: 33 U/L (ref 9–46)
AST: 39 U/L — ABNORMAL HIGH (ref 10–35)
Albumin: 4.1 g/dL (ref 3.6–5.1)
Alkaline phosphatase (APISO): 102 U/L (ref 35–144)
BUN: 8 mg/dL (ref 7–25)
CO2: 30 mmol/L (ref 20–32)
Calcium: 10 mg/dL (ref 8.6–10.3)
Chloride: 100 mmol/L (ref 98–110)
Creat: 1.14 mg/dL (ref 0.70–1.25)
GFR, Est African American: 78 mL/min/{1.73_m2} (ref >=60)
GFR, Est Non African American: 67 mL/min/{1.73_m2} (ref >=60)
Globulin: 3.1 g/dL (calc) (ref 1.9–3.7)
Glucose, Bld: 123 mg/dL — ABNORMAL HIGH (ref 65–99)
Potassium: 5.1 mmol/L (ref 3.5–5.3)
Sodium: 136 mmol/L (ref 135–146)
Total Bilirubin: 0.6 mg/dL (ref 0.2–1.2)
Total Protein: 7.2 g/dL (ref 6.1–8.1)

## 2020-06-09 LAB — CBC WITH DIFFERENTIAL/PLATELET
Absolute Monocytes: 480 cells/uL (ref 200–950)
Basophils Absolute: 32 cells/uL (ref 0–200)
Basophils Relative: 0.9 %
Eosinophils Absolute: 161 cells/uL (ref 15–500)
Eosinophils Relative: 4.6 %
HCT: 44.3 % (ref 38.5–50.0)
Hemoglobin: 15.2 g/dL (ref 13.2–17.1)
Lymphs Abs: 651 cells/uL — ABNORMAL LOW (ref 850–3900)
MCH: 33.3 pg — ABNORMAL HIGH (ref 27.0–33.0)
MCHC: 34.3 g/dL (ref 32.0–36.0)
MCV: 97.1 fL (ref 80.0–100.0)
MPV: 10.4 fL (ref 7.5–12.5)
Monocytes Relative: 13.7 %
Neutro Abs: 2177 cells/uL (ref 1500–7800)
Neutrophils Relative %: 62.2 %
Platelets: 153 10*3/uL (ref 140–400)
RBC: 4.56 10*6/uL (ref 4.20–5.80)
RDW: 13.4 % (ref 11.0–15.0)
Total Lymphocyte: 18.6 %
WBC: 3.5 10*3/uL — ABNORMAL LOW (ref 3.8–10.8)

## 2020-06-09 LAB — HEMOGLOBIN A1C
Hgb A1c MFr Bld: 5.9 % of total Hgb — ABNORMAL HIGH (ref <5.7)
Mean Plasma Glucose: 123 (calc)
eAG (mmol/L): 6.8 (calc)

## 2020-06-09 LAB — TSH: TSH: 1.66 mIU/L (ref 0.40–4.50)

## 2020-06-09 LAB — PSA: PSA: 0.69 ng/mL (ref <=4.0)

## 2020-06-15 ENCOUNTER — Encounter: Payer: Self-pay | Admitting: Urology

## 2020-06-15 ENCOUNTER — Encounter: Payer: Self-pay | Admitting: Family Medicine

## 2020-06-15 ENCOUNTER — Ambulatory Visit (INDEPENDENT_AMBULATORY_CARE_PROVIDER_SITE_OTHER): Payer: Medicare HMO | Admitting: Family Medicine

## 2020-06-15 ENCOUNTER — Other Ambulatory Visit: Payer: Self-pay

## 2020-06-15 ENCOUNTER — Ambulatory Visit: Payer: Medicare HMO | Admitting: Urology

## 2020-06-15 VITALS — BP 127/79 | HR 72 | Temp 97.5°F | Resp 16 | Ht 71.0 in | Wt 183.6 lb

## 2020-06-15 VITALS — BP 151/82 | HR 78 | Ht 71.0 in | Wt 183.0 lb

## 2020-06-15 DIAGNOSIS — N401 Enlarged prostate with lower urinary tract symptoms: Secondary | ICD-10-CM | POA: Diagnosis not present

## 2020-06-15 DIAGNOSIS — Z87898 Personal history of other specified conditions: Secondary | ICD-10-CM

## 2020-06-15 DIAGNOSIS — N5201 Erectile dysfunction due to arterial insufficiency: Secondary | ICD-10-CM | POA: Diagnosis not present

## 2020-06-15 DIAGNOSIS — Z0000 Encounter for general adult medical examination without abnormal findings: Secondary | ICD-10-CM

## 2020-06-15 DIAGNOSIS — E1169 Type 2 diabetes mellitus with other specified complication: Secondary | ICD-10-CM

## 2020-06-15 DIAGNOSIS — E1149 Type 2 diabetes mellitus with other diabetic neurological complication: Secondary | ICD-10-CM | POA: Diagnosis not present

## 2020-06-15 DIAGNOSIS — R69 Illness, unspecified: Secondary | ICD-10-CM | POA: Diagnosis not present

## 2020-06-15 DIAGNOSIS — E785 Hyperlipidemia, unspecified: Secondary | ICD-10-CM

## 2020-06-15 DIAGNOSIS — R350 Frequency of micturition: Secondary | ICD-10-CM | POA: Diagnosis not present

## 2020-06-15 DIAGNOSIS — Z23 Encounter for immunization: Secondary | ICD-10-CM

## 2020-06-15 DIAGNOSIS — Z87442 Personal history of urinary calculi: Secondary | ICD-10-CM

## 2020-06-15 DIAGNOSIS — F339 Major depressive disorder, recurrent, unspecified: Secondary | ICD-10-CM | POA: Diagnosis not present

## 2020-06-15 DIAGNOSIS — I10 Essential (primary) hypertension: Secondary | ICD-10-CM | POA: Diagnosis not present

## 2020-06-15 DIAGNOSIS — F1021 Alcohol dependence, in remission: Secondary | ICD-10-CM

## 2020-06-15 LAB — BLADDER SCAN AMB NON-IMAGING: Scan Result: 32

## 2020-06-15 MED ORDER — TAMSULOSIN HCL 0.4 MG PO CAPS: 0.8000 mg | ORAL_CAPSULE | Freq: Every day | ORAL | 3 refills | Status: DC

## 2020-06-15 MED ORDER — SILDENAFIL CITRATE 20 MG PO TABS: ORAL_TABLET | ORAL | 1 refills | Status: DC

## 2020-06-15 MED ORDER — ROSUVASTATIN CALCIUM 5 MG PO TABS: 5.0000 mg | ORAL_TABLET | Freq: Every day | ORAL | 3 refills | Status: DC

## 2020-06-15 NOTE — Assessment & Plan Note (Signed)
Controlled HTN No known complications     Plan:  1. Continue current BP regimen Benazepril 40mg  daily  2. Encourage improved lifestyle - low sodium diet, regular exercise 3. Continue monitor BP outside office, bring readings to next visit, if persistently >140/90 or new symptoms notify office sooner

## 2020-06-15 NOTE — Progress Notes (Signed)
Subjective:    Patient ID: Richard Columbia Sr., male    DOB: 11-Nov-1955, 64 y.o.   MRN: [id number]  Richard GANAS Sr. is a 64 y.o. male presenting on 06/15/2020 for Annual Exam   HPI  Here for Annual Physical  CHRONIC DM, Type 2 with DM Neuropathy/ Overweight BMI >25 Hypertension - Last lab A1c 5.9, slight increase, he has done well since maintained back on ozempic - Today reportshe continues to do very well at this time CBGs:Checks CBGs 1-3x daily - avg 120-140, rarely low Meds: - Ozempic64m weekly (PAP program) -Metformin 1007mBID (previously tried to reduce to once daily but now back to twice) - Failed Victoza in past, OFF Glimepiride, OFF NPH Insulin Reports good compliance. Tolerating well w/o side-effects Currently on ACEi Lifestyle: - Weight down 10-15 lbs in past 1 year - Diet (improved diet overall still - reduced portions) - Exercise (increased activity and exercise) Chronic issue with known,Bilateral feet, mild loss of sensitivity Taking Vitamin B12 Magnesium, MVI, Melatonin.Admits tingling and neuropathy in feet bilateral. Takes OTC medicine for neuropathy. Still has cramping pain in both calves - outer side. Now not having cramping in feet mostly calves. Request to start Gabapentin therapy for neuropathy as discussed - Last DM Eye exam by Dr BeGloriann Loan2020 - now due in 2021 he will schedule) Denies hypoglycemia, polyuria, visual changes or tingling.  Major Depression, recurrent in remission / Insomnia Alcohol Dependence in remission Reports his mood has been good overall without new concerns. Continues on SSRI Sertraline 5011maily with great results Continues on Trazodone 1.5 pills for 26m14mghtly for insomnia.  HYPERLIPIDEMIA: - Reports no concerns. Last lipid panel 06/2020, mild elevated LDL abnormal 124, fasting today for lipid panel check Interested in Statin  CHRONIC HTN: Reports no concern Current Meds - Benazepril 40mg59mly   Reports good  compliance, took meds today. Tolerating well, w/o complaints. Denies CP, dyspnea, HA, edema, dizziness / lightheadedness    Health Maintenance: UTD COVID19 vaccine. Booster done 06/07/20  Due for Flu Shot, will receive today   Last colonoscopy approx 2008. He has declined in interval due to ins coverage and other concerns. - Cologuard done 12/28/19 - negative, good for 3 years, next 12/2022  UTD PNA Vaccine   Depression screen [facility]12[facility] 12/15/2019 06/10/2019  Decreased Interest 0 0 1  Down, Depressed, Hopeless 0 0 1  PHQ - 2 Score 0 0 2  Altered sleeping 0 0 0  Tired, decreased energy 0 0 0  Change in appetite 0 0 0  Feeling bad or failure about yourself  0 0 1  Trouble concentrating 0 1 0  Moving slowly or fidgety/restless 0 0 0  Suicidal thoughts 0 0 0  PHQ-9 Score 0 1 3  Difficult doing work/chores Not difficult at all Not difficult at all Not difficult at all  Some recent data might be hidden    Past Medical History:  Diagnosis Date  . Anxiety   . Cholelithiasis   . GERD (gastroesophageal reflux disease)   . Hyperlipidemia    Past Surgical History:  Procedure Laterality Date  . Lung Collapse  24 ye53s old.    Social History   Socioeconomic History  . Marital status: Married    Spouse name: Not on file  . Number of children: Not on file  . Years of education: Not on file  . Highest education level: Not on file  Occupational History  . Not on file  Tobacco  Use  . Smoking status: Former Smoker    Years: 10.00    Quit date: 08/08/1999    Years since quitting: 20.8  . Smokeless tobacco: Former Network engineer  . Vaping Use: Never used  Substance and Sexual Activity  . Alcohol use: No    Alcohol/week: 0.0 standard drinks    Comment: Has requested Antabuse at previous PCP visit.   . Drug use: No  . Sexual activity: Yes  Other Topics Concern  . Not on file  Social History Narrative  . Not on file   Social Determinants of Health   Financial  Resource Strain:   . Difficulty of Paying Living Expenses: Not on file  Food Insecurity:   . Worried About Charity fundraiser in the Last Year: Not on file  . Ran Out of Food in the Last Year: Not on file  Transportation Needs:   . Lack of Transportation (Medical): Not on file  . Lack of Transportation (Non-Medical): Not on file  Physical Activity:   . Days of Exercise per Week: Not on file  . Minutes of Exercise per Session: Not on file  Stress:   . Feeling of Stress : Not on file  Social Connections:   . Frequency of Communication with Friends and Family: Not on file  . Frequency of Social Gatherings with Friends and Family: Not on file  . Attends Religious Services: Not on file  . Active Member of Clubs or Organizations: Not on file  . Attends Archivist Meetings: Not on file  . Marital Status: Not on file  Intimate Partner Violence:   . Fear of Current or Ex-Partner: Not on file  . Emotionally Abused: Not on file  . Physically Abused: Not on file  . Sexually Abused: Not on file   Family History  Problem Relation Age of Onset  . Diabetes Father        complications of DM caused death  . Heart attack Father   . Diabetes Sister   . Cancer Mother        lung  . Lung cancer Mother    Current Outpatient Medications on File Prior to Visit  Medication Sig  . allopurinol (ZYLOPRIM) 300 MG tablet Take 1 tablet (300 mg total) by mouth daily.  Marland Kitchen aspirin 81 MG tablet Take 1 tablet by mouth daily at 6 (six) AM.  . benazepril (LOTENSIN) 40 MG tablet Take 1 tablet (40 mg total) by mouth daily at 6 (six) AM.  . Blood Glucose Calibration (OT ULTRA/FASTTK CNTRL SOLN) SOLN Use as instructed to check blood sugars twice a day.  . Blood Glucose Monitoring Suppl (ONE TOUCH ULTRA 2) w/Device KIT Use as instructed to check blood sugars twice a day.  . Calcium Citrate-Vitamin D (CALCIUM CITRATE + D PO) Take 1 tablet by mouth every evening.  . famotidine (PEPCID) 20 MG tablet Take 20  mg by mouth in the morning.  . gabapentin (NEURONTIN) 100 MG capsule TAKE ONE CAPSULE BY MOUTH THREE TIMES A DAY  . ketoconazole (NIZORAL) 2 % shampoo Apply 1 application topically 2 (two) times a week. As needed for scalp dermatitis  . Lancets Misc. (ONE TOUCH SURESOFT) MISC Use as instructed to check blood sugars twice a day.  . Magnesium Gluconate 500 (27 MG) MG TABS Take 1 tablet by mouth every evening.   . melatonin 5 MG TABS Take 1 tablet by mouth at bedtime.   . metFORMIN (GLUCOPHAGE) 1000 MG tablet Take  1 tablet (1,000 mg total) by mouth 2 (two) times daily with a meal.  . MULTIPLE VITAMIN PO Take by mouth.  Glory Rosebush ULTRA test strip Use as instructed to check blood sugars twice a day.  Marland Kitchen OZEMPIC, 1 MG/DOSE, 4 MG/3ML SOPN Inject 1 mg into the skin once a week.  . saw palmetto 160 MG capsule Take 1 capsule (160 mg total) by mouth 2 (two) times daily.  . sertraline (ZOLOFT) 50 MG tablet Take 1 tablet (50 mg total) by mouth daily.  . sildenafil (REVATIO) 20 MG tablet Take 3-5 tablets daily by mouth as needed  . tamsulosin (FLOMAX) 0.4 MG CAPS capsule TAKE TWO CAPSULES BY MOUTH DAILY  . traZODone (DESYREL) 50 MG tablet Take 1.5 tablets (75 mg total) by mouth at bedtime.  . vitamin B-12 (CYANOCOBALAMIN) 1000 MCG tablet Take by mouth.  . zinc gluconate 50 MG tablet Take 50 mg by mouth daily.   No current facility-administered medications on file prior to visit.    Review of Systems  Constitutional: Negative for activity change, appetite change, chills, diaphoresis, fatigue and fever.  HENT: Negative for congestion and hearing loss.   Eyes: Negative for visual disturbance.  Respiratory: Negative for cough, chest tightness, shortness of breath and wheezing.   Cardiovascular: Negative for chest pain, palpitations and leg swelling.  Gastrointestinal: Negative for abdominal pain, constipation, diarrhea, nausea and vomiting.  Endocrine: Negative for cold intolerance.  Genitourinary:  Negative for dysuria, frequency and hematuria.  Musculoskeletal: Negative for arthralgias and neck pain.  Skin: Negative for rash.  Allergic/Immunologic: Negative for environmental allergies.  Neurological: Negative for dizziness, weakness, light-headedness, numbness and headaches.  Hematological: Negative for adenopathy.  Psychiatric/Behavioral: Negative for behavioral problems, dysphoric mood and sleep disturbance.   Per HPI unless specifically indicated above      Objective:    BP 127/79   Pulse 72   Temp (!) 97.5 F (36.4 C) (Temporal)   Resp 16   Ht 5' 11"  (1.803 m)   Wt 183 lb 9.6 oz (83.3 kg)   SpO2 100%   BMI 25.61 kg/m   Wt Readings from Last 3 Encounters:  06/15/20 183 lb 9.6 oz (83.3 kg)  12/15/19 192 lb (87.1 kg)  06/11/19 198 lb 14.4 oz (90.2 kg)    Physical Exam Vitals and nursing note reviewed.  Constitutional:      General: He is not in acute distress.    Appearance: He is well-developed. He is not diaphoretic.     Comments: Well-appearing, comfortable, cooperative  HENT:     Head: Normocephalic and atraumatic.     Right Ear: Tympanic membrane, ear canal and external ear normal. There is no impacted cerumen.     Left Ear: Tympanic membrane, ear canal and external ear normal. There is no impacted cerumen.  Eyes:     General:        Right eye: No discharge.        Left eye: No discharge.     Conjunctiva/sclera: Conjunctivae normal.     Pupils: Pupils are equal, round, and reactive to light.  Neck:     Thyroid: No thyromegaly.     Vascular: No carotid bruit.  Cardiovascular:     Rate and Rhythm: Normal rate and regular rhythm.     Heart sounds: Normal heart sounds. No murmur heard.   Pulmonary:     Effort: Pulmonary effort is normal. No respiratory distress.     Breath sounds: Normal breath sounds. No wheezing or  rales.  Abdominal:     General: Bowel sounds are normal. There is no distension.     Palpations: Abdomen is soft. There is no mass.      Tenderness: There is no abdominal tenderness.  Musculoskeletal:        General: No tenderness. Normal range of motion.     Cervical back: Normal range of motion and neck supple.     Right lower leg: No edema.     Left lower leg: No edema.     Comments: Upper / Lower Extremities: - Normal muscle tone, strength bilateral upper extremities 5/5, lower extremities 5/5  Lymphadenopathy:     Cervical: No cervical adenopathy.  Skin:    General: Skin is warm and dry.     Findings: No erythema or rash.  Neurological:     Mental Status: He is alert and oriented to person, place, and time.     Comments: Distal sensation intact to light touch all extremities  Psychiatric:        Behavior: Behavior normal.     Comments: Well groomed, good eye contact, normal speech and thoughts       Diabetic Foot Exam - Simple   Simple Foot Form Diabetic Foot exam was performed with the following findings: Yes 06/15/2020  8:24 AM  Visual Inspection See comments: Yes Sensation Testing Intact to touch and monofilament testing bilaterally: Yes Pulse Check Posterior Tibialis and Dorsalis pulse intact bilaterally: Yes Comments Mild callus formation, but no ulceration. No significant deformity.    Recent Labs    12/15/19 0838 06/08/20 0859  HGBA1C 5.8* 5.9*    Results for orders placed or performed in visit on 06/08/20  TSH  Result Value Ref Range   TSH 1.66 0.40 - 4.50 mIU/L  PSA  Result Value Ref Range   PSA 0.69 < OR = 4.0 ng/mL  Lipid panel  Result Value Ref Range   Cholesterol 190 <200 mg/dL   HDL 43 > OR = 40 mg/dL   Triglycerides 118 <150 mg/dL   LDL Cholesterol (Calc) 124 (H) mg/dL (calc)   Total CHOL/HDL Ratio 4.4 <5.0 (calc)   Non-HDL Cholesterol (Calc) 147 (H) <130 mg/dL (calc)  COMPLETE METABOLIC PANEL WITH GFR  Result Value Ref Range   Glucose, Bld 123 (H) 65 - 99 mg/dL   BUN 8 7 - 25 mg/dL   Creat 1.14 0.70 - 1.25 mg/dL   GFR, Est Non African American 67 > OR = 60  mL/min/1.70m   GFR, Est African American 78 > OR = 60 mL/min/1.763m  BUN/Creatinine Ratio NOT APPLICABLE 6 - 22 (calc)   Sodium 136 135 - 146 mmol/L   Potassium 5.1 3.5 - 5.3 mmol/L   Chloride 100 98 - 110 mmol/L   CO2 30 20 - 32 mmol/L   Calcium 10.0 8.6 - 10.3 mg/dL   Total Protein 7.2 6.1 - 8.1 g/dL   Albumin 4.1 3.6 - 5.1 g/dL   Globulin 3.1 1.9 - 3.7 g/dL (calc)   AG Ratio 1.3 1.0 - 2.5 (calc)   Total Bilirubin 0.6 0.2 - 1.2 mg/dL   Alkaline phosphatase (APISO) 102 35 - 144 U/L   AST 39 (H) 10 - 35 U/L   ALT 33 9 - 46 U/L  CBC with Differential/Platelet  Result Value Ref Range   WBC 3.5 (L) 3.8 - 10.8 Thousand/uL   RBC 4.56 4.20 - 5.80 Million/uL   Hemoglobin 15.2 13.2 - 17.1 g/dL   HCT 44.3 38 -  50 %   MCV 97.1 80.0 - 100.0 fL   MCH 33.3 (H) 27.0 - 33.0 pg   MCHC 34.3 32.0 - 36.0 g/dL   RDW 13.4 11.0 - 15.0 %   Platelets 153 140 - 400 Thousand/uL   MPV 10.4 7.5 - 12.5 fL   Neutro Abs 2,177 1,500 - 7,800 cells/uL   Lymphs Abs 651 (L) 850 - 3,900 cells/uL   Absolute Monocytes 480 200 - 950 cells/uL   Eosinophils Absolute 161 15.0 - 500.0 cells/uL   Basophils Absolute 32 0.0 - 200.0 cells/uL   Neutrophils Relative % 62.2 %   Total Lymphocyte 18.6 %   Monocytes Relative 13.7 %   Eosinophils Relative 4.6 %   Basophils Relative 0.9 %  Hemoglobin A1c  Result Value Ref Range   Hgb A1c MFr Bld 5.9 (H) <5.7 % of total Hgb   Mean Plasma Glucose 123 (calc)   eAG (mmol/L) 6.8 (calc)      Assessment & Plan:   Problem List Items Addressed This Visit    Major depression, recurrent, chronic (HCC)    Controlled mood on SSRI and Trazodone On SSRI Sertraline 56m Continue Trazodone 780mnightly Follow-up as planned      Hyperlipidemia associated with type 2 diabetes mellitus (HCC)    Mild elevated LDL Last lipid panel 06/2020 The 10-year ASCVD risk score (GMikey BussingC Jr., et al., 2013) is: 27.5% Prior statin failed Simvastatin (stopped due to LFTs, however chart review  shows unlikely d/t statin since still elevated >1 year off statin, likely from prior alcohol history)  Plan: 1. Reconsider Statin - he will check cost coverage, let me know - recommend low dose Rosuvastatin 2. Continue ASA 8188mor primary ASCVD risk reduction 3. Encourage improved lifestyle - low carb/cholesterol, reduce portion size, continue improving regular exercise      Essential hypertension    Controlled HTN No known complications     Plan:  1. Continue current BP regimen Benazepril 37m54mily  2. Encourage improved lifestyle - low sodium diet, regular exercise 3. Continue monitor BP outside office, bring readings to next visit, if persistently >140/90 or new symptoms notify office sooner      DM (diabetes mellitus), type 2 with neurological complications (HCC)[place] Controlled DM at A1c 5.9 on GLP1 and Diet Complications - peripheral neuropathy, other including hyperlipidemia, GERD, depression - increases risk of future cardiovascular complications poor glucose control due to reduced lifestyle diet/exercise with low energy mood and fatigue - Failed Victoza. OFF Glimepiride and NPH Insulin  Plan:  1. CONTINUE dose for Ozepmic up to 1mg 51mkly injection (PAP program) 2. Keep on metformin 1000mg 94m2. Encourage improved lifestyle - low carb, low sugar diet, reduce portion size, continue improving regular exercise 3. Check CBG, bring log to next visit for review - For DM Neuropathy start gabapentin titration for symptoms 4. Continue ASA, ACEi  Add statin if he is interested, will check coverage and let me know      Alcohol dependence in remission (HCC)  Harri[place], without any recurrence of alcohol intake       Other Visit Diagnoses    Annual physical exam    -  Primary   Needs flu shot       Relevant Orders   Flu Vaccine QUAD High Dose(Fluad) (Completed)      Updated Health Maintenance information - Flu shot today UTD COVID Next Cologuard / screening due  12/2022 Reviewed recent lab results with  patient Encouraged improvement to lifestyle with diet and exercise - Goal of weight loss   No orders of the defined types were placed in this encounter.     Follow up plan: Return in about 3 months (around 09/13/2020) for 3 month follow-up fasting lab only then 1 week later Lab result F/u cholesterol (virtual or inperson.  Future 3 month CMET LIPID if start statin   Nobie Putnam, [facility] 06/15/2020, 8:17 AM

## 2020-06-15 NOTE — Assessment & Plan Note (Signed)
Controlled DM at A1c 5.9 on GLP1 and Diet Complications - peripheral neuropathy, other including hyperlipidemia, GERD, depression - increases risk of future cardiovascular complications poor glucose control due to reduced lifestyle diet/exercise with low energy mood and fatigue - Failed Victoza. OFF Glimepiride and NPH Insulin  Plan:  1. CONTINUE dose for Ozepmic up to 1mg  weekly injection (PAP program) 2. Keep on metformin 1000mg  BID 2. Encourage improved lifestyle - low carb, low sugar diet, reduce portion size, continue improving regular exercise 3. Check CBG, bring log to next visit for review - For DM Neuropathy start gabapentin titration for symptoms 4. Continue ASA, ACEi  Add statin if he is interested, will check coverage and let me know

## 2020-06-15 NOTE — Assessment & Plan Note (Signed)
Mild elevated LDL Last lipid panel 06/2020 The 10-year ASCVD risk score Mikey Bussing DC Jr., et al., 2013) is: 27.5% Prior statin failed Simvastatin (stopped due to LFTs, however chart review shows unlikely d/t statin since still elevated >1 year off statin, likely from prior alcohol history)  Plan: 1. Reconsider Statin - he will check cost coverage, let me know - recommend low dose Rosuvastatin 2. Continue ASA 81mg  for primary ASCVD risk reduction 3. Encourage improved lifestyle - low carb/cholesterol, reduce portion size, continue improving regular exercise

## 2020-06-15 NOTE — Assessment & Plan Note (Signed)
Stable, without any recurrence of alcohol intake

## 2020-06-15 NOTE — Progress Notes (Signed)
06/15/2020 9:20 AM   Richard Render Lumm Sr. 10/14/1955 [id number]  Referring provider: [name], DO [address] [place],  [place] [address]  Chief Complaint  Patient presents with  . Benign Prostatic Hypertrophy    Urologic history:  1.  History elevated PSA -Prostate biopsy 2007 Dr. Jacqlyn Larsen; PSA not listed in record review -Path ASAP -Rebiopsy negative  2.  BPH with lower urinary tract symptoms -Tamsulosin  3.  History urinary calculi -History uric acid stones -On allopurinol  4.  Erectile dysfunction -On generic sildenafil  HPI: 64 y.o. male presents for annual follow-up.   Has noted some increased urinary frequency and postvoid dribbling since last years visit  Remains on tamsulosin twice daily  Denies dysuria, gross hematuria  No flank, abdominal or pelvic pain  PSA 06/08/2020 stable at 0.69  No recurrent stone symptoms  Sildenafil effective  PMH: Past Medical History:  Diagnosis Date  . Anxiety   . Cholelithiasis   . GERD (gastroesophageal reflux disease)   . Hyperlipidemia     Surgical History: Past Surgical History:  Procedure Laterality Date  . Lung Collapse  64 years old.     Home Medications:  Allergies as of 06/15/2020      Reactions   Bee Venom Swelling   Itching       Medication List       Accurate as of June 15, 2020  9:20 AM. If you have any questions, ask your nurse or doctor.        allopurinol 300 MG tablet Commonly known as: ZYLOPRIM Take 1 tablet (300 mg total) by mouth daily.   aspirin 81 MG tablet Take 1 tablet by mouth daily at 6 (six) AM.   benazepril 40 MG tablet Commonly known as: LOTENSIN Take 1 tablet (40 mg total) by mouth daily at 6 (six) AM.   CALCIUM CITRATE + D PO Take 1 tablet by mouth every evening.   famotidine 20 MG tablet Commonly known as: PEPCID Take 20 mg by mouth in the morning.   gabapentin 100 MG capsule Commonly known as: NEURONTIN TAKE ONE CAPSULE BY MOUTH THREE  TIMES A DAY   ketoconazole 2 % shampoo Commonly known as: NIZORAL Apply 1 application topically 2 (two) times a week. As needed for scalp dermatitis   Magnesium Gluconate 500 (27 Mg) MG Tabs Take 1 tablet by mouth every evening.   melatonin 5 MG Tabs Take 1 tablet by mouth at bedtime.   metFORMIN 1000 MG tablet Commonly known as: GLUCOPHAGE Take 1 tablet (1,000 mg total) by mouth 2 (two) times daily with a meal.   MULTIPLE VITAMIN PO Take by mouth.   ONE TOUCH SURESOFT Misc Use as instructed to check blood sugars twice a day.   ONE TOUCH ULTRA 2 w/Device Kit Use as instructed to check blood sugars twice a day.   OneTouch Ultra test strip Generic drug: glucose blood Use as instructed to check blood sugars twice a day.   OT ULTRA/FASTTK CNTRL SOLN Soln Use as instructed to check blood sugars twice a day.   Ozempic (1 MG/DOSE) 4 MG/3ML Sopn Generic drug: Semaglutide (1 MG/DOSE) Inject 1 mg into the skin once a week.   saw palmetto 160 MG capsule Take 1 capsule (160 mg total) by mouth 2 (two) times daily.   sertraline 50 MG tablet Commonly known as: ZOLOFT Take 1 tablet (50 mg total) by mouth daily.   sildenafil 20 MG tablet Commonly known as: REVATIO Take 3-5  tablets daily by mouth as needed   tamsulosin 0.4 MG Caps capsule Commonly known as: FLOMAX TAKE TWO CAPSULES BY MOUTH DAILY   traZODone 50 MG tablet Commonly known as: DESYREL Take 1.5 tablets (75 mg total) by mouth at bedtime.   vitamin B-12 1000 MCG tablet Commonly known as: CYANOCOBALAMIN Take by mouth.   zinc gluconate 50 MG tablet Take 50 mg by mouth daily.       Allergies:  Allergies  Allergen Reactions  . Bee Venom Swelling    Itching     Family History: Family History  Problem Relation Age of Onset  . Diabetes Father        complications of DM caused death  . Heart attack Father   . Diabetes Sister   . Cancer Mother        lung  . Lung cancer Mother     Social History:   reports that he quit smoking about 20 years ago. He quit after 10.00 years of use. He has quit using smokeless tobacco. He reports that he does not drink alcohol and does not use drugs.   Physical Exam: There were no vitals taken for this visit.  Constitutional:  Alert and oriented, No acute distress. HEENT: [place] AT, moist mucus membranes.  Trachea midline, no masses. Cardiovascular: No clubbing, cyanosis, or edema. Respiratory: Normal respiratory effort, no increased work of breathing. GI: Abdomen is soft, nontender, nondistended, no abdominal masses GU: Prostate 35 g, smooth without nodules Lymph: No cervical or inguinal lymphadenopathy. Skin: No rashes, bruises or suspicious lesions. Neurologic: Grossly intact, no focal deficits, moving all 4 extremities. Psychiatric: Normal mood and affect.  Laboratory Data:  Urinalysis Dipstick trace blood Microscopy negative   Assessment & Plan:    1. Benign prostatic hyperplasia with urinary frequency  Slight worsening of symptoms with increased frequency and postvoid dribbling  He is presently not interested in further medical management (5-ARI, [facility] med) or surgical management  Tamsulosin refilled  Bladder scan PVR 32 mL  Continue annual follow-up    2.  Erectile dysfunction  Stable on sildenafil  Refill sent  3.  History nephrolithiasis  Asymptomatic  4.  History elevated PSA  PSA remains low and DRE benign   [name], MD  [facility] [address] [place], [place] [address] [phone]

## 2020-06-15 NOTE — Assessment & Plan Note (Signed)
Controlled mood on SSRI and Trazodone On SSRI Sertraline 50mg  Continue Trazodone 75mg  nightly Follow-up as planned

## 2020-06-15 NOTE — Patient Instructions (Addendum)
Thank you for coming to the office today.  The 10-year ASCVD risk score Mikey Bussing DC Brooke Bonito., et al., 2013) is: 27.5%   Values used to calculate the score:     Age: 64 years     Sex: Male     Is Non-Hispanic African American: No     Diabetic: Yes     Tobacco smoker: No     Systolic Blood Pressure: 580 mmHg     Is BP treated: Yes     HDL Cholesterol: 43 mg/dL     Total Cholesterol: 190 mg/dL  Check into Cholesterol Statin medications - Rosuvastatin (Crestor) - Atorvastatin (Lipitor) - Pravastatin (Pravachol) - Simvastatin (Zocor)  Leg cramps - Try spoonful of yellow mustard to relieve leg cramps or try daily to prevent the problem  - OTC natural option is Hyland's Leg Cramps (Dissolving tablet) take as needed for muscle cramps  I do think the magnesium is helpful for cramps.  Please schedule a Follow-up Appointment to: Return in about 3 months (around 09/13/2020) for 3 month follow-up fasting lab only then 1 week later Lab result F/u cholesterol (virtual or inperson.  If you have any other questions or concerns, please feel free to call the office or send a message through [place]. You may also schedule an earlier appointment if necessary.  Additionally, you may be receiving a survey about your experience at our office within a few days to 1 week by e-mail or mail. We value your feedback.  Nobie Putnam, DO [place]

## 2020-06-20 LAB — URINALYSIS, COMPLETE
Bilirubin, UA: NEGATIVE
Glucose, UA: NEGATIVE
Ketones, UA: NEGATIVE
Leukocytes,UA: NEGATIVE
Nitrite, UA: NEGATIVE
Protein,UA: NEGATIVE
Specific Gravity, UA: 1.02 (ref 1.005–1.030)
Urobilinogen, Ur: 0.2 mg/dL (ref 0.2–1.0)
pH, UA: 5.5 (ref 5.0–7.5)

## 2020-06-20 LAB — MICROSCOPIC EXAMINATION
Bacteria, UA: NONE SEEN
Epithelial Cells (non renal): NONE SEEN /hpf (ref 0–10)
RBC, Urine: NONE SEEN /hpf (ref 0–2)
WBC, UA: NONE SEEN /hpf (ref 0–5)

## 2020-07-06 ENCOUNTER — Other Ambulatory Visit: Payer: Self-pay | Admitting: Family Medicine

## 2020-07-06 DIAGNOSIS — E1149 Type 2 diabetes mellitus with other diabetic neurological complication: Secondary | ICD-10-CM

## 2020-07-11 ENCOUNTER — Ambulatory Visit: Payer: Medicare HMO | Admitting: Pharmacist

## 2020-07-11 DIAGNOSIS — E1169 Type 2 diabetes mellitus with other specified complication: Secondary | ICD-10-CM

## 2020-07-11 DIAGNOSIS — E1149 Type 2 diabetes mellitus with other diabetic neurological complication: Secondary | ICD-10-CM

## 2020-07-11 DIAGNOSIS — E785 Hyperlipidemia, unspecified: Secondary | ICD-10-CM

## 2020-07-11 NOTE — Patient Instructions (Signed)
Thank you allowing the Chronic Care Management Team to be a part of your care! It was a pleasure speaking with you today!     CCM (Chronic Care Management) Team    Alto Denver RN, MSN, CCM Nurse Care Coordinator  [phone]   Duanne Moron PharmD  Clinical Pharmacist  [phone]   Dickie La LCSW Clinical Social Worker [phone]  Visit Information  Goals Addressed            This Visit's Progress   . PharmD - Medication Assistance       CARE PLAN ENTRY (see longtitudinal plan of care for additional care plan information)  Current Barriers:  . Financial Barriers in complicated patient with multiple medical conditions including HTN, T2DM, HLD, BPH and depression; patient has [employer] and reports copay for Ozempic is cost prohibitive at this time  Pharmacist Clinical Goal(s):  Marland Kitchen Over the next 30 days, patient will work with PharmD and providers to relieve medication access concerns  Interventions:  Type 2 Diabetes . Reports taking: o Metformin 1000 mg twice daily o Ozempic 1 mg weekly . Reports recent home fasting blood sugars ranging: 100-130 . Reports has 6 pens of Ozempic from patient assistance program remaining  Medication Assistance . Will collaborate with [facility] CPhT for aid to patient with completing Novo Nordisk re-enrollment application for 2022 calendar year  Hyperlipidemia . Reports taking rosuvastatin 5 mg daily as directed (sent by PCP on 06/15/20)  . Note patient with history of failed simvastatin (stopped due to LFTs, however chart review shows unlikely d/t statin since still elevated >1 year off statin, likely from prior alcohol history)  Patient Self Care Activities:  . Patient will provide necessary portions of application  . Patient attends medical appointments as scheduled   Please see past updates related to this goal by clicking on the "Past Updates" button in the selected goal         The  patient verbalized understanding of instructions, educational materials, and care plan provided today and declined offer to receive copy of patient instructions, educational materials, and care plan.   Telephone follow up appointment with care management team member scheduled for: 08/17/2020 at 9 am  Duanne Moron, PharmD, [facility] Clinical Pharmacist [facility] Medical [employer] [phone]

## 2020-07-11 NOTE — Chronic Care Management (AMB) (Signed)
Chronic Care Management   Follow Up Note   07/11/2020 Name: Richard BOLOGNA Sr. MRN: [id number] DOB: 08/07/1955  Referred by: [name], DO Reason for referral : Chronic Care Management (Patient Phone Call)   Richard Columbia Sr. is a 65 y.o. year old male who is a primary care patient of [name], DO. The CCM team was consulted for assistance with chronic disease management and care coordination needs.    I reached out to Richard Columbia Sr. by phone today.   Review of patient status, including review of consultants reports, relevant laboratory and other test results, and collaboration with appropriate care team members and the patient's provider was performed as part of comprehensive patient evaluation and provision of chronic care management services.    SDOH (Social Determinants of Health) assessments performed: No See Care Plan activities for detailed interventions related to [facility])     Outpatient Encounter Medications as of 07/11/2020  Medication Sig Note  . allopurinol (ZYLOPRIM) 300 MG tablet Take 1 tablet (300 mg total) by mouth daily.   Marland Kitchen aspirin 81 MG tablet Take 1 tablet by mouth daily at 6 (six) AM. 11/06/2014: Received from: [place]:   . benazepril (LOTENSIN) 40 MG tablet Take 1 tablet (40 mg total) by mouth daily at 6 (six) AM.   . Blood Glucose Calibration (OT ULTRA/FASTTK CNTRL SOLN) SOLN Use as instructed to check blood sugars twice a day.   . Blood Glucose Monitoring Suppl (ONE TOUCH ULTRA 2) w/Device KIT Use as instructed to check blood sugars twice a day.   . Calcium Citrate-Vitamin D (CALCIUM CITRATE + D PO) Take 1 tablet by mouth every evening.   . famotidine (PEPCID) 20 MG tablet Take 20 mg by mouth in the morning.   . gabapentin (NEURONTIN) 100 MG capsule TAKE ONE CAPSULE BY MOUTH THREE TIMES A DAY   . ketoconazole (NIZORAL) 2 % shampoo Apply 1 application topically 2 (two) times a week. As needed for  scalp dermatitis   . Lancets Misc. (ONE TOUCH SURESOFT) MISC Use as instructed to check blood sugars twice a day.   . Magnesium Gluconate 500 (27 MG) MG TABS Take 1 tablet by mouth every evening.  11/06/2014: Received from: [facility]  . melatonin 5 MG TABS Take 1 tablet by mouth at bedtime.    . metFORMIN (GLUCOPHAGE) 1000 MG tablet Take 1 tablet (1,000 mg total) by mouth 2 (two) times daily with a meal.   . MULTIPLE VITAMIN PO Take by mouth. 11/06/2014: Received from: [facility]  . ONETOUCH ULTRA test strip Use as instructed to check blood sugars twice a day.   Marland Kitchen OZEMPIC, 1 MG/DOSE, 4 MG/3ML SOPN Inject 1 mg into the skin once a week.   . rosuvastatin (CRESTOR) 5 MG tablet Take 1 tablet (5 mg total) by mouth at bedtime.   . saw palmetto 160 MG capsule Take 1 capsule (160 mg total) by mouth 2 (two) times daily.   . sertraline (ZOLOFT) 50 MG tablet Take 1 tablet (50 mg total) by mouth daily.   . sildenafil (REVATIO) 20 MG tablet Take 3-5 tablets daily by mouth as needed   . tamsulosin (FLOMAX) 0.4 MG CAPS capsule Take 2 capsules (0.8 mg total) by mouth daily.   . traZODone (DESYREL) 50 MG tablet Take 1.5 tablets (75 mg total) by mouth at bedtime.   . vitamin B-12 (CYANOCOBALAMIN) 1000 MCG tablet Take by mouth.   . zinc  gluconate 50 MG tablet Take 50 mg by mouth daily.    No facility-administered encounter medications on file as of 07/11/2020.    Goals Addressed            This Visit's Progress   . PharmD - Medication Assistance       CARE PLAN ENTRY (see longtitudinal plan of care for additional care plan information)  Current Barriers:  . Financial Barriers in complicated patient with multiple medical conditions including HTN, T2DM, HLD, BPH and depression; patient has [employer] and reports copay for Ozempic is cost prohibitive at this time  Pharmacist Clinical Goal(s):  Marland Kitchen Over the next 30 days, patient will work  with PharmD and providers to relieve medication access concerns  Interventions:  Type 2 Diabetes . Reports taking: o Metformin 1000 mg twice daily o Ozempic 1 mg weekly . Reports recent home fasting blood sugars ranging: 100-130 . Reports has 6 pens of Ozempic from patient assistance program remaining  Medication Assistance . Will collaborate with [place] for aid to patient with completing [place] re-enrollment application for 9914 calendar year  Hyperlipidemia . Reports taking rosuvastatin 5 mg daily as directed (sent by PCP on 06/15/20)  . Note patient with history of failed simvastatin (stopped due to LFTs, however chart review shows unlikely d/t statin since still elevated >1 year off statin, likely from prior alcohol history)  Patient Self Care Activities:  . Patient will provide necessary portions of application  . Patient attends medical appointments as scheduled   Please see past updates related to this goal by clicking on the "Past Updates" button in the selected goal         Plan  Telephone follow up appointment with care management team member scheduled for: 08/17/2020 at 9 am  Harlow Asa, PharmD, [place] [phone]

## 2020-07-19 DIAGNOSIS — E113393 Type 2 diabetes mellitus with moderate nonproliferative diabetic retinopathy without macular edema, bilateral: Secondary | ICD-10-CM | POA: Diagnosis not present

## 2020-08-17 ENCOUNTER — Ambulatory Visit: Payer: Medicare HMO | Admitting: Pharmacist

## 2020-08-17 DIAGNOSIS — E785 Hyperlipidemia, unspecified: Secondary | ICD-10-CM

## 2020-08-17 DIAGNOSIS — E1149 Type 2 diabetes mellitus with other diabetic neurological complication: Secondary | ICD-10-CM

## 2020-08-17 DIAGNOSIS — E1169 Type 2 diabetes mellitus with other specified complication: Secondary | ICD-10-CM

## 2020-08-17 NOTE — Chronic Care Management (AMB) (Signed)
Chronic Care Management Pharmacy Note  08/17/2020 Name:  Richard MAZZONI Sr. MRN:  [id number] DOB:  05-Dec-1955  Subjective: Richard Render Stearns Sr. is an 65 y.o. year old male who is a primary patient of [name], DO.  The CCM team was consulted for assistance with disease management and care coordination needs.    Engaged with patient by telephone for follow up visit in response to provider referral for pharmacy case management and/or care coordination services.   Consent to Services:  The patient was given information about Chronic Care Management services, agreed to services, and gave verbal consent prior to initiation of services.  Please see initial visit note for detailed documentation.   Objective:  Lab Results  Component Value Date   CREATININE 1.14 06/08/2020   CREATININE 0.92 06/02/2019   CREATININE 1.00 05/26/2018    Lab Results  Component Value Date   HGBA1C 5.9 (H) 06/08/2020       Component Value Date/Time   CHOL 190 06/08/2020 0859   CHOL 226 (H) 01/20/2015 1501   TRIG 118 06/08/2020 0859   HDL 43 06/08/2020 0859   HDL 44 01/20/2015 1501   CHOLHDL 4.4 06/08/2020 0859   LDLCALC 124 (H) 06/08/2020 0859    Clinical ASCVD: No  The 10-year ASCVD risk score Mikey Bussing DC Jr., et al., 2013) is: 35.5%   Values used to calculate the score:     Age: 30 years     Sex: Male     Is Non-Hispanic African American: No     Diabetic: Yes     Tobacco smoker: No     Systolic Blood Pressure: 269 mmHg     Is BP treated: Yes     HDL Cholesterol: 43 mg/dL     Total Cholesterol: 190 mg/dL     Assessment: Review of patient past medical history, allergies, medications, health status, including review of consultants reports, laboratory and other test data, was performed as part of comprehensive evaluation and provision of chronic care management services.   SDOH:  (Social Determinants of Health) assessments and interventions performed: none   CCM Care  Plan  Allergies  Allergen Reactions  . Bee Venom Swelling    Itching     Medications Reviewed Today    Reviewed by [name], CMA (Certified Medical Assistant) on 06/15/20 at [place] List Status: <None>  Medication Order Taking? Sig Documenting Provider Last Dose Status Informant  allopurinol (ZYLOPRIM) 300 MG tablet [id number] Yes Take 1 tablet (300 mg total) by mouth daily. [name], MD Taking Active   aspirin 81 MG tablet [id number] Yes Take 1 tablet by mouth daily at 6 (six) AM. [name], [name], MD Taking Active            Med Note [facility], [name]   Sat Nov 06, 2014 11:01 AM) Received from: [place]:   benazepril (LOTENSIN) 40 MG tablet [id number] Yes Take 1 tablet (40 mg total) by mouth daily at 6 (six) AM. [name], DO Taking Active   Blood Glucose Calibration (OT ULTRA/FASTTK CNTRL SOLN) SOLN [id number] Yes Use as instructed to check blood sugars twice a day. [name], DO Taking Active   Blood Glucose Monitoring Suppl (ONE TOUCH ULTRA 2) w/Device KIT [id number] Yes Use as instructed to check blood sugars twice a day. [name], DO Taking Active   Calcium Citrate-Vitamin D (CALCIUM CITRATE + D PO) [id number] Yes Take 1 tablet  by mouth every evening. [provider] Taking Active   famotidine (PEPCID) 20 MG tablet [id number] Yes Take 20 mg by mouth in the morning. [provider] Taking Active   gabapentin (NEURONTIN) 100 MG capsule [id number] Yes TAKE ONE CAPSULE BY MOUTH THREE TIMES A DAY [name], [name], DO Taking Active   ketoconazole (NIZORAL) 2 % shampoo [id number] Yes Apply 1 application topically 2 (two) times a week. As needed for scalp dermatitis [place], [name], DO Taking Active   Lancets Misc. ([place]) [place] [id number] Yes Use as instructed to check blood sugars twice a day. [name], DO Taking Active   Magnesium  Gluconate 500 (27 MG) MG TABS [id number] Yes Take 1 tablet by mouth every evening.  [name], MD Taking Active            Med Note [facility], [name]   Sat Nov 06, 2014 11:01 AM) Received from: [facility]  melatonin 5 MG TABS [id number] Yes Take 1 tablet by mouth at bedtime.  [provider] Taking Active   metFORMIN (GLUCOPHAGE) 1000 MG tablet [id number] Yes Take 1 tablet (1,000 mg total) by mouth 2 (two) times daily with a meal. [name], DO Taking Active   MULTIPLE VITAMIN PO [id number] Yes Take by mouth. [name], MD Taking Active            Med Note [facility], [name]   Sat Nov 06, 2014 11:01 AM) Received from: [place] test strip [id number] Yes Use as instructed to check blood sugars twice a day. [name], DO Taking Active   OZEMPIC, 1 MG/DOSE, 4 MG/3ML SOPN [id number] Yes Inject 1 mg into the skin once a week. [name], DO Taking Active   saw palmetto 160 MG capsule [id number] Yes Take 1 capsule (160 mg total) by mouth 2 (two) times daily. [name], DO Taking Active   sertraline (ZOLOFT) 50 MG tablet [id number] Yes Take 1 tablet (50 mg total) by mouth daily. [name], DO Taking Active   sildenafil (REVATIO) 20 MG tablet [id number] Yes Take 3-5 tablets daily by mouth as needed [name], [name], MD Taking Active   tamsulosin (FLOMAX) 0.4 MG CAPS capsule [id number] Yes TAKE TWO CAPSULES BY MOUTH DAILY [name], [name], MD Taking Active   traZODone (DESYREL) 50 MG tablet [id number] Yes Take 1.5 tablets (75 mg total) by mouth at bedtime. [name], DO Taking Active   vitamin B-12 (CYANOCOBALAMIN) 1000 MCG tablet [id number] Yes Take by mouth. [provider] Taking Active   zinc gluconate 50 MG tablet [id number] Yes Take 50 mg by mouth daily. [provider] Taking Active           Patient Active Problem List    Diagnosis Date Noted  . Overweight (BMI 25.0-29.9) 06/10/2019  . Major depression, recurrent, chronic ([place]) 11/25/2017  . Incomplete emptying of bladder 05/09/2017  . Alcohol dependence in remission ([place]) 05/06/2017  . Elevated LFTs 05/01/2017  . History of cholelithiasis 01/21/2017  . Screening for colon cancer 01/21/2017  . Hyperlipidemia associated with type 2 diabetes mellitus ([place]) 01/13/2015  . DM (diabetes mellitus), type 2 with neurological complications ([place]) 29/47/6546  . Allergic rhinitis 11/06/2014  . Benign prostatic hyperplasia with urinary obstruction 11/06/2014  . Essential hypertension 11/06/2014  . ED (erectile dysfunction) of organic origin 11/06/2014  . GERD (gastroesophageal reflux disease) 11/06/2014  . H/O renal calculi 11/06/2014  .  Calculus of kidney 11/06/2014  . GAD (generalized anxiety disorder) 12/13/2012  . Abnormal prostate specific antigen 03/05/2012  . Bilateral inguinal hernia 03/05/2012  . 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 Nephrolithiasis, uric acid 03/05/2012  . Nodular prostate with urinary obstruction 03/05/2012    Conditions to be addressed/monitored: HLD and DMII  Care Plan : PharmD - Medication Assistance  Updates made by Vella Raring, RPH since 08/17/2020 12:00 AM    Problem: Disease Progression     Long-Range Goal: Disease Progression Prevented or Minimized   Start Date: 08/17/2020  Expected End Date: 10/16/2020  This Visit's Progress: On track  Priority: High  Note:   Current Barriers:  . Financial Barriers in complicated patient with multiple medical conditions including HTN, T2DM, HLD, BPH and depression; patient has [employer] and reports copay for Ozempic is cost prohibitive at this time  Pharmacist Clinical Goal(s):  Marland Kitchen Over the next 60 days, patient will verbalize ability to afford treatment regimen through collaboration with PharmD and provider.    Interventions: . 1:1 collaboration with [name], DO regarding development and update of comprehensive plan of care as evidenced by provider attestation and co-signature . Inter-disciplinary care team collaboration (see longitudinal plan of care)  Type 2 Diabetes . Reports taking: o Metformin 1000 mg twice daily o Ozempic 1 mg weekly . Reports recent home fasting blood sugars ranging: 110-120  Medication Assistance . Per review of notes from [place], CPhT received completed application back from patient yesterday. Both provider and patient portion faxed to [employer] on 2/8 . Patient confirms has 5 pens of Ozempic from patient assistance program remaining . Will continue to collaborate with [place] for aid to patient with completing [place] re-enrollment application for 6073 calendar year  Hyperlipidemia . Reports taking rosuvastatin 5 mg daily (prescribed 06/15/20) o Reports tolerating well; denies side effects . Note patient with history of failed simvastatin (stopped due to LFTs, however chart review shows unlikely d/t statin since still elevated >1 year off statin, likely from prior alcohol history)   Patient Goals/Self-Care Activities . Over the next 60 days, patient will:  - take medications as prescribed - check glucose, document, and provide at future appointments - check blood pressure, document, and provide at future appointments - collaborate with provider on medication access solutions  Follow Up Plan: Telephone follow up appointment with care management team member scheduled for: 3/14 at 9:45 am      Medication Assistance: Application for Ozempic  medication assistance program. in process. See plan of care for additional detail.  Follow Up:  Patient agrees to Care Plan and Follow-up.  Harlow Asa, PharmD, [place] [phone]

## 2020-08-17 NOTE — Patient Instructions (Signed)
Visit Information  PATIENT GOALS: Goals Addressed            This Visit's Progress   . Medication Assistance       Provide necessary portions of application to apply for re-enrollment in [employer] patient assistance program for 2022 calendar year       The patient verbalized understanding of instructions, educational materials, and care plan provided today and declined offer to receive copy of patient instructions, educational materials, and care plan.   Telephone follow up appointment with care management team member scheduled for: 3/14 at 9:45 am  Harlow Asa, PharmD, [place] [phone]

## 2020-08-31 ENCOUNTER — Telehealth: Payer: Self-pay | Admitting: *Deleted

## 2020-08-31 NOTE — Progress Notes (Signed)
Patient contacted in [place] does not need to be rescheduled.

## 2020-08-31 NOTE — Chronic Care Management (AMB) (Deleted)
  Care Management   Note  08/31/2020 Name: Richard GREEK Sr. MRN: [id number] DOB: 02/02/1956  Richard Render Leaming Sr. is a 65 y.o. year old male who is a primary care patient of [name], DO and is actively engaged with the care management team. I reached out to [place]. by phone today to assist with re-scheduling a follow up visit with the Pharmacist  Follow up plan: Unsuccessful telephone outreach attempt made. A HIPAA compliant phone message was left for the patient providing contact information and requesting a return call.  The care management team will reach out to the patient again over the next 7 days.  If patient returns call to provider office, please advise to call [place] Lysle Morales at [phone]  SIGNATURE

## 2020-09-01 ENCOUNTER — Other Ambulatory Visit: Payer: Self-pay | Admitting: Family Medicine

## 2020-09-01 DIAGNOSIS — E1149 Type 2 diabetes mellitus with other diabetic neurological complication: Secondary | ICD-10-CM

## 2020-09-01 NOTE — Telephone Encounter (Signed)
Requested Prescriptions  Pending Prescriptions Disp Refills  . gabapentin (NEURONTIN) 100 MG capsule [Pharmacy Med Name: GABAPENTIN 100 MG CAPSULE] 90 capsule 1    Sig: TAKE ONE CAPSULE BY MOUTH THREE TIMES A DAY     Neurology: Anticonvulsants - gabapentin Passed - 09/01/2020 11:42 AM      Passed - Valid encounter within last 12 months    Recent Outpatient Visits          2 months ago Annual physical exam   [place], DO   8 months ago DM (diabetes mellitus), type 2 with neurological complications [facility])   [place], DO   1 year ago Annual physical exam   [facility] [name], DO   1 year ago DM (diabetes mellitus), type 2 with neurological complications [facility])   [facility] [name], DO   2 years ago Annual physical exam   [facility] [name], DO      Future Appointments            In 2 weeks [place], [name], [facility], [place]   In 9 months [place], [name], [place]

## 2020-09-07 ENCOUNTER — Telehealth: Payer: Self-pay

## 2020-09-07 ENCOUNTER — Other Ambulatory Visit: Payer: Self-pay | Admitting: *Deleted

## 2020-09-07 DIAGNOSIS — E785 Hyperlipidemia, unspecified: Secondary | ICD-10-CM

## 2020-09-07 DIAGNOSIS — E1169 Type 2 diabetes mellitus with other specified complication: Secondary | ICD-10-CM

## 2020-09-07 NOTE — Telephone Encounter (Signed)
The pt wife Bonnita Nasuti notified that his Ozempic from patient assistance arrived and is available to be picked up.

## 2020-09-08 ENCOUNTER — Other Ambulatory Visit: Payer: Medicare HMO

## 2020-09-08 ENCOUNTER — Other Ambulatory Visit: Payer: Self-pay

## 2020-09-08 DIAGNOSIS — E785 Hyperlipidemia, unspecified: Secondary | ICD-10-CM | POA: Diagnosis not present

## 2020-09-08 DIAGNOSIS — E1169 Type 2 diabetes mellitus with other specified complication: Secondary | ICD-10-CM | POA: Diagnosis not present

## 2020-09-08 LAB — COMPLETE METABOLIC PANEL WITH GFR
AG Ratio: 1.4 (calc) (ref 1.0–2.5)
ALT: 24 U/L (ref 9–46)
AST: 34 U/L (ref 10–35)
Albumin: 4.2 g/dL (ref 3.6–5.1)
Alkaline phosphatase (APISO): 115 U/L (ref 35–144)
BUN: 9 mg/dL (ref 7–25)
CO2: 30 mmol/L (ref 20–32)
Calcium: 9.4 mg/dL (ref 8.6–10.3)
Chloride: 104 mmol/L (ref 98–110)
Creat: 0.9 mg/dL (ref 0.70–1.25)
GFR, Est African American: 104 mL/min/{1.73_m2} (ref >=60)
GFR, Est Non African American: 89 mL/min/{1.73_m2} (ref >=60)
Globulin: 2.9 g/dL (calc) (ref 1.9–3.7)
Glucose, Bld: 115 mg/dL — ABNORMAL HIGH (ref 65–99)
Potassium: 4.7 mmol/L (ref 3.5–5.3)
Sodium: 139 mmol/L (ref 135–146)
Total Bilirubin: 0.6 mg/dL (ref 0.2–1.2)
Total Protein: 7.1 g/dL (ref 6.1–8.1)

## 2020-09-08 LAB — LIPID PANEL
Cholesterol: 136 mg/dL (ref <200)
HDL: 44 mg/dL (ref >=40)
LDL Cholesterol (Calc): 74 mg/dL (calc)
Non-HDL Cholesterol (Calc): 92 mg/dL (calc) (ref <130)
Total CHOL/HDL Ratio: 3.1 (calc) (ref <5.0)
Triglycerides: 99 mg/dL (ref <150)

## 2020-09-15 ENCOUNTER — Telehealth (INDEPENDENT_AMBULATORY_CARE_PROVIDER_SITE_OTHER): Payer: Medicare HMO | Admitting: Family Medicine

## 2020-09-15 ENCOUNTER — Encounter: Payer: Self-pay | Admitting: Family Medicine

## 2020-09-15 ENCOUNTER — Other Ambulatory Visit: Payer: Self-pay | Admitting: Family Medicine

## 2020-09-15 ENCOUNTER — Other Ambulatory Visit: Payer: Self-pay

## 2020-09-15 VITALS — Ht 71.0 in | Wt 174.0 lb

## 2020-09-15 DIAGNOSIS — Z0000 Encounter for general adult medical examination without abnormal findings: Secondary | ICD-10-CM

## 2020-09-15 DIAGNOSIS — E1149 Type 2 diabetes mellitus with other diabetic neurological complication: Secondary | ICD-10-CM

## 2020-09-15 DIAGNOSIS — E1169 Type 2 diabetes mellitus with other specified complication: Secondary | ICD-10-CM

## 2020-09-15 DIAGNOSIS — N138 Other obstructive and reflux uropathy: Secondary | ICD-10-CM

## 2020-09-15 DIAGNOSIS — N401 Enlarged prostate with lower urinary tract symptoms: Secondary | ICD-10-CM

## 2020-09-15 DIAGNOSIS — I10 Essential (primary) hypertension: Secondary | ICD-10-CM

## 2020-09-15 DIAGNOSIS — F339 Major depressive disorder, recurrent, unspecified: Secondary | ICD-10-CM

## 2020-09-15 DIAGNOSIS — E785 Hyperlipidemia, unspecified: Secondary | ICD-10-CM

## 2020-09-15 NOTE — Assessment & Plan Note (Signed)
Dramatic improved LDL 124 down to 74 on low dose statin. Normal LFTs The 10-year ASCVD risk score Mikey Bussing DC Jr., et al., 2013) is: 28.7% Prior statin failed Simvastatin (stopped due to LFTs, however chart review shows unlikely d/t statin since still elevated >1 year off statin, likely from prior alcohol history)  Plan: 1.Continue Rosuvastatin 5mg  dailyi 2. Continue ASA 81mg  for primary ASCVD risk reduction 3. Encourage improved lifestyle - low carb/cholesterol, reduce portion size, continue improving regular exercise

## 2020-09-15 NOTE — Assessment & Plan Note (Signed)
Previously Controlled DM at A1c 5.9 on GLP1 and Diet Complications - peripheral neuropathy, other including hyperlipidemia, GERD, depression - increases risk of future cardiovascular complications poor glucose control due to reduced lifestyle diet/exercise with low energy mood and fatigue - Failed Victoza. OFF Glimepiride and NPH Insulin  Plan:  1. CONTINUE dose for Ozepmic up to 1mg  weekly injection (PAP program) 2. Keep on metformin 1000mg  BID 2. Encourage improved lifestyle - low carb, low sugar diet, reduce portion size, continue improving regular exercise 3. Check CBG, bring log to next visit for review - For DM Neuropathy - keep on Gabapentin, discussed sensation may not improve, but he is controlling sugar and may consider referral to Neurology for NCS and consultation. Other causes Mag, B12 etc all normal, normal chemistry 4. Continue ASA, ACEi

## 2020-09-15 NOTE — Progress Notes (Signed)
Virtual Visit via Telephone The purpose of this virtual visit is to provide medical care while limiting exposure to the novel coronavirus (COVID19) for both patient and office staff.  Consent was obtained for phone visit:  Yes.   Answered questions that patient had about telehealth interaction:  Yes.   I discussed the limitations, risks, security and privacy concerns of performing an evaluation and management service by telephone. I also discussed with the patient that there may be a patient responsible charge related to this service. The patient expressed understanding and agreed to proceed.  Patient Location: Home Provider Location: [facility] (Office)  Participants in virtual visit: - Patient: Richard Day. Richard Day - CMA: Orinda Kenner, CMA - Provider: Dr Parks Ranger  ---------------------------------------------------------------------- Chief Complaint  Patient presents with  . Hyperlipidemia    S: Reviewed CMA documentation. I have called patient and gathered additional HPI as follows:  Hyperlipidemia Last lipid 06/2020 with elevated LDL 124, he agreed to start Statin therapy for ASCVD risk as DM Now results significant improvement LDL down to 74 all other numbers improved Chemistry show normal LFTs Continues on Rosuvastatin 85m daily, tolerating well without myalgia, had mild side effect initially now resolved.  Leg Cramping Chronic issue, persistent, bilateral calves lower leg, usually in AM while still in bed. Resolves quick within 15 second or minute, can manually rub muscles to help resolve, takes mustard now with quick relief. Labs show normal chemistry and K  Diabetic Neuropathy Controlled T2DM, prior A1c 5 to 6 range Gradual decline with reduced sensation generalized bilateral, top and bottom. He has no pain in feet and no problem with circulation He takes gabapentin regularly Has not seen Neuropathy He has quit consuming alcohol, in remission. Takes  magnesium  Denies any fevers, chills, sweats, body ache, cough, shortness of breath, sinus pain or pressure, headache, abdominal pain, diarrhea  Past Medical History:  Diagnosis Date  . Anxiety   . Cholelithiasis   . GERD (gastroesophageal reflux disease)   . Hyperlipidemia    Social History   Tobacco Use  . Smoking status: Former Smoker    Years: 10.00    Quit date: 08/08/1999    Years since quitting: 21.1  . Smokeless tobacco: Former UNetwork engineer . Vaping Use: Never used  Substance Use Topics  . Alcohol use: No    Alcohol/week: 0.0 standard drinks    Comment: Has requested Antabuse at previous PCP visit.   . Drug use: No    Current Outpatient Medications:  .  allopurinol (ZYLOPRIM) 300 MG tablet, Take 1 tablet (300 mg total) by mouth daily., Disp: 90 tablet, Rfl: 3 .  aspirin 81 MG tablet, Take 1 tablet by mouth daily at 6 (six) AM., Disp: , Rfl:  .  benazepril (LOTENSIN) 40 MG tablet, Take 1 tablet (40 mg total) by mouth daily at 6 (six) AM., Disp: 90 tablet, Rfl: 3 .  Blood Glucose Calibration (OT ULTRA/FASTTK CNTRL SOLN) SOLN, Use as instructed to check blood sugars twice a day., Disp: 1 each, Rfl: 5 .  Blood Glucose Monitoring Suppl (ONE TOUCH ULTRA 2) w/Device KIT, Use as instructed to check blood sugars twice a day., Disp: 1 kit, Rfl: 0 .  Calcium Citrate-Vitamin D (CALCIUM CITRATE + D PO), Take 1 tablet by mouth every evening., Disp: , Rfl:  .  famotidine (PEPCID) 20 MG tablet, Take 20 mg by mouth in the morning., Disp: , Rfl:  .  gabapentin (NEURONTIN) 100 MG capsule, TAKE ONE  CAPSULE BY MOUTH THREE TIMES A DAY, Disp: 90 capsule, Rfl: 1 .  ketoconazole (NIZORAL) 2 % shampoo, Apply 1 application topically 2 (two) times a week. As needed for scalp dermatitis, Disp: 120 mL, Rfl: 0 .  Lancets Misc. (ONE TOUCH SURESOFT) MISC, Use as instructed to check blood sugars twice a day., Disp: 100 each, Rfl: 12 .  Magnesium Gluconate 500 (27 MG) MG TABS, Take 1 tablet by mouth  every evening. , Disp: , Rfl:  .  melatonin 5 MG TABS, Take 1 tablet by mouth at bedtime. , Disp: , Rfl:  .  metFORMIN (GLUCOPHAGE) 1000 MG tablet, Take 1 tablet (1,000 mg total) by mouth 2 (two) times daily with a meal., Disp: 180 tablet, Rfl: 3 .  MULTIPLE VITAMIN PO, Take by mouth., Disp: , Rfl:  .  ONETOUCH ULTRA test strip, Use as instructed to check blood sugars twice a day., Disp: 100 each, Rfl: 12 .  OZEMPIC, 1 MG/DOSE, 4 MG/3ML SOPN, Inject 1 mg into the skin once a week., Disp: 3 mL, Rfl: 0 .  rosuvastatin (CRESTOR) 5 MG tablet, Take 1 tablet (5 mg total) by mouth at bedtime., Disp: 90 tablet, Rfl: 3 .  saw palmetto 160 MG capsule, Take 1 capsule (160 mg total) by mouth 2 (two) times daily., Disp: , Rfl:  .  sertraline (ZOLOFT) 50 MG tablet, Take 1 tablet (50 mg total) by mouth daily., Disp: 90 tablet, Rfl: 3 .  sildenafil (REVATIO) 20 MG tablet, Take 3-5 tablets daily by mouth as needed, Disp: 90 tablet, Rfl: 1 .  tamsulosin (FLOMAX) 0.4 MG CAPS capsule, Take 2 capsules (0.8 mg total) by mouth daily., Disp: 180 capsule, Rfl: 3 .  traZODone (DESYREL) 50 MG tablet, Take 1.5 tablets (75 mg total) by mouth at bedtime., Disp: 135 tablet, Rfl: 3 .  vitamin B-12 (CYANOCOBALAMIN) 1000 MCG tablet, Take by mouth., Disp: , Rfl:  .  zinc gluconate 50 MG tablet, Take 50 mg by mouth daily., Disp: , Rfl:   Depression screen [facility] 2/9 06/15/2020 12/15/2019 06/10/2019  Decreased Interest 0 0 1  Down, Depressed, Hopeless 0 0 1  PHQ - 2 Score 0 0 2  Altered sleeping 0 0 0  Tired, decreased energy 0 0 0  Change in appetite 0 0 0  Feeling bad or failure about yourself  0 0 1  Trouble concentrating 0 1 0  Moving slowly or fidgety/restless 0 0 0  Suicidal thoughts 0 0 0  PHQ-9 Score 0 1 3  Difficult doing work/chores Not difficult at all Not difficult at all Not difficult at all  Some recent data might be hidden    GAD 7 : Generalized Anxiety Score 02/26/2018 11/25/2017  Nervous, Anxious, on Edge 0 3   Control/stop worrying 0 3  Worry too much - different things 0 3  Trouble relaxing 0 2  Restless 0 1  Easily annoyed or irritable 0 1  Afraid - awful might happen 0 2  Total GAD 7 Score 0 15  Anxiety Difficulty Not difficult at all Very difficult    -------------------------------------------------------------------------- O: No physical exam performed due to remote telephone encounter.  Lab results reviewed.  Recent Results (from the past 2160 hour(s))  Lipid panel     Status: None   Collection Time: 09/08/20  8:13 AM  Result Value Ref Range   Cholesterol 136 <200 mg/dL   HDL 44 > OR = 40 mg/dL   Triglycerides 99 <150 mg/dL   LDL Cholesterol (  Calc) 74 mg/dL (calc)    Comment: Reference range: <100 . Desirable range <100 mg/dL for primary prevention;   <70 mg/dL for patients with CHD or diabetic patients  with > or = 2 CHD risk factors. Marland Kitchen LDL-C is now calculated using the Martin-Hopkins  calculation, which is a validated novel method providing  better accuracy than the Friedewald equation in the  estimation of LDL-C.  Cresenciano Genre et al. Annamaria Helling. 2248;250(03): 2061-2068  (http://education.[employer].com/faq/FAQ164)    Total CHOL/HDL Ratio 3.1 <5.0 (calc)   Non-HDL Cholesterol (Calc) 92 <130 mg/dL (calc)    Comment: For patients with diabetes plus 1 major ASCVD risk  factor, treating to a non-HDL-C goal of <100 mg/dL  (LDL-C of <70 mg/dL) is considered a therapeutic  option.   COMPLETE METABOLIC PANEL WITH GFR     Status: Abnormal   Collection Time: 09/08/20  8:13 AM  Result Value Ref Range   Glucose, Bld 115 (H) 65 - 99 mg/dL    Comment: .            Fasting reference interval . For someone without known diabetes, a glucose value between 100 and 125 mg/dL is consistent with prediabetes and should be confirmed with a follow-up test. .    BUN 9 7 - 25 mg/dL   Creat 0.90 0.70 - 1.25 mg/dL    Comment: For patients >78 years of age, the reference limit for  Creatinine is approximately 13% higher for people identified as African-American. .    GFR, Est Non African American 89 > OR = 60 mL/min/1.16m   GFR, Est African American 104 > OR = 60 mL/min/1.786m  BUN/Creatinine Ratio NOT APPLICABLE 6 - 22 (calc)   Sodium 139 135 - 146 mmol/L   Potassium 4.7 3.5 - 5.3 mmol/L   Chloride 104 98 - 110 mmol/L   CO2 30 20 - 32 mmol/L   Calcium 9.4 8.6 - 10.3 mg/dL   Total Protein 7.1 6.1 - 8.1 g/dL   Albumin 4.2 3.6 - 5.1 g/dL   Globulin 2.9 1.9 - 3.7 g/dL (calc)   AG Ratio 1.4 1.0 - 2.5 (calc)   Total Bilirubin 0.6 0.2 - 1.2 mg/dL   Alkaline phosphatase (APISO) 115 35 - 144 U/L   AST 34 10 - 35 U/L   ALT 24 9 - 46 U/L    -------------------------------------------------------------------------- A&P:  Problem List Items Addressed This Visit    Hyperlipidemia associated with type 2 diabetes mellitus (HCC)    Dramatic improved LDL 124 down to 74 on low dose statin. Normal LFTs The 10-year ASCVD risk score (GMikey BussingC Jr., et al., 2013) is: 28.7% Prior statin failed Simvastatin (stopped due to LFTs, however chart review shows unlikely d/t statin since still elevated >1 year off statin, likely from prior alcohol history)  Plan: 1.Continue Rosuvastatin 99m66mailyi 2. Continue ASA 56m74mr primary ASCVD risk reduction 3. Encourage improved lifestyle - low carb/cholesterol, reduce portion size, continue improving regular exercise      DM (diabetes mellitus), type 2 with neurological complications (HCC)[place]    Previously Controlled DM at A1c 5.9 on GLP1 and Diet Complications - peripheral neuropathy, other including hyperlipidemia, GERD, depression - increases risk of future cardiovascular complications poor glucose control due to reduced lifestyle diet/exercise with low energy mood and fatigue - Failed Victoza. OFF Glimepiride and NPH Insulin  Plan:  1. CONTINUE dose for Ozepmic up to 1mg 43mkly injection (PAP program) 2. Keep on metformin  1000mg 84m2.  Encourage improved lifestyle - low carb, low sugar diet, reduce portion size, continue improving regular exercise 3. Check CBG, bring log to next visit for review - For DM Neuropathy - keep on Gabapentin, discussed sensation may not improve, but he is controlling sugar and may consider referral to Neurology for NCS and consultation. Other causes Mag, B12 etc all normal, normal chemistry 4. Continue ASA, ACEi         No orders of the defined types were placed in this encounter.   Follow-up: - Return in 9 months for 06/2021 annual physical  Patient verbalizes understanding with the above medical recommendations including the limitation of remote medical advice.  Specific follow-up and call-back criteria were given for patient to follow-up or seek medical care more urgently if needed.   - Time spent in direct consultation with patient on phone: 11 minutes   Nobie Putnam, [facility] 09/15/2020, 8:28 AM

## 2020-09-19 ENCOUNTER — Ambulatory Visit (INDEPENDENT_AMBULATORY_CARE_PROVIDER_SITE_OTHER): Payer: Medicare HMO | Admitting: Pharmacist

## 2020-09-19 DIAGNOSIS — E1149 Type 2 diabetes mellitus with other diabetic neurological complication: Secondary | ICD-10-CM | POA: Diagnosis not present

## 2020-09-19 DIAGNOSIS — E785 Hyperlipidemia, unspecified: Secondary | ICD-10-CM

## 2020-09-19 DIAGNOSIS — E1169 Type 2 diabetes mellitus with other specified complication: Secondary | ICD-10-CM | POA: Diagnosis not present

## 2020-09-19 NOTE — Chronic Care Management (AMB) (Signed)
Chronic Care Management Pharmacy Note  09/19/2020 Name:  Richard MACOMBER Sr. MRN:  [id number] DOB:  Oct 01, 1955  Subjective: Richard Render Hereford Sr. is an 65 y.o. year old male who is a primary patient of [name], DO.  The CCM team was consulted for assistance with disease management and care coordination needs.    Engaged with patient by telephone for follow up visit in response to provider referral for pharmacy case management and/or care coordination services.   Consent to Services:  The patient was given information about Chronic Care Management services, agreed to services, and gave verbal consent prior to initiation of services.  Please see initial visit note for detailed documentation.   Patient Care Team: [name], DO as PCP - General (Family Medicine) Christene Lye, MD (General Surgery) Luciana Axe, NP as Nurse Practitioner (Family Medicine) Tylan Kinn, Virl Diamond, [facility] as Pharmacist (Pharmacist)  Recent office visits: Virtual Visit with PCP on 3/10  Hospital visits: None in previous 6 months  Objective:  Lab Results  Component Value Date   CREATININE 0.90 09/08/2020   CREATININE 1.14 06/08/2020   CREATININE 0.92 06/02/2019    Lab Results  Component Value Date   HGBA1C 5.9 (H) 06/08/2020   Last diabetic Eye exam:  Lab Results  Component Value Date/Time   HMDIABEYEEXA No Retinopathy 08/13/2018 12:00 AM        Component Value Date/Time   CHOL 136 09/08/2020 0813   CHOL 226 (H) 01/20/2015 1501   TRIG 99 09/08/2020 0813   HDL 44 09/08/2020 0813   HDL 44 01/20/2015 1501   CHOLHDL 3.1 09/08/2020 0813   LDLCALC 74 09/08/2020 0813    Hepatic Function Latest Ref Rng & Units 09/08/2020 06/08/2020 06/02/2019  Total Protein 6.1 - 8.1 g/dL 7.1 7.2 7.1  Albumin 3.6 - 5.1 g/dL - - -  AST 10 - 35 U/L 34 39(H) 109(H)  ALT 9 - 46 U/L 24 33 79(H)  Alk Phosphatase 40 - 115 U/L - - -  Total Bilirubin 0.2 - 1.2 mg/dL 0.6 0.6 0.9     Clinical ASCVD: No  The 10-year ASCVD risk score Mikey Bussing DC Jr., et al., 2013) is: 28.7%   Values used to calculate the score:     Age: 62 years     Sex: Male     Is Non-Hispanic African American: No     Diabetic: Yes     Tobacco smoker: No     Systolic Blood Pressure: 419 mmHg     Is BP treated: Yes     HDL Cholesterol: 44 mg/dL     Total Cholesterol: 136 mg/dL     Social History   Tobacco Use  Smoking Status Former Smoker  . Years: 10.00  . Quit date: 08/08/1999  . Years since quitting: 21.1  Smokeless Tobacco Former User   BP Readings from Last 3 Encounters:  06/15/20 (!) 151/82  06/15/20 127/79  12/15/19 134/70   Pulse Readings from Last 3 Encounters:  06/15/20 78  06/15/20 72  12/15/19 91   Wt Readings from Last 3 Encounters:  09/15/20 174 lb (78.9 kg)  06/15/20 183 lb (83 kg)  06/15/20 183 lb 9.6 oz (83.3 kg)    Assessment: Review of patient past medical history, allergies, medications, health status, including review of consultants reports, laboratory and other test data, was performed as part of comprehensive evaluation and provision of chronic care management services.   SDOH:  (Social Determinants of Health)  assessments and interventions performed: none   CCM Care Plan  Allergies  Allergen Reactions  . Bee Venom Swelling    Itching     Medications Reviewed Today    Reviewed by [name], DO (Physician) on 09/15/20 at [id number]  Med List Status: <None>  Medication Order Taking? Sig Documenting Provider Last Dose Status Informant  allopurinol (ZYLOPRIM) 300 MG tablet [id number] No Take 1 tablet (300 mg total) by mouth daily. [name], MD Taking Active   aspirin 81 MG tablet [id number] No Take 1 tablet by mouth daily at 6 (six) AM. [name], [name], MD Taking Active            Med Note [facility], [name]   Sat Nov 06, 2014 11:01 AM) Received from: [place]:   benazepril (LOTENSIN) 40 MG tablet  [id number] No Take 1 tablet (40 mg total) by mouth daily at 6 (six) AM. [name], DO Taking Active   Blood Glucose Calibration (OT ULTRA/FASTTK CNTRL SOLN) SOLN [id number] No Use as instructed to check blood sugars twice a day. [name], DO Taking Active   Blood Glucose Monitoring Suppl (ONE TOUCH ULTRA 2) w/Device KIT [id number] No Use as instructed to check blood sugars twice a day. [name], DO Taking Active   Calcium Citrate-Vitamin D (CALCIUM CITRATE + D PO) [id number] No Take 1 tablet by mouth every evening. [provider] Taking Active   famotidine (PEPCID) 20 MG tablet [id number] No Take 20 mg by mouth in the morning. [provider] Taking Active   gabapentin (NEURONTIN) 100 MG capsule [id number]  TAKE ONE CAPSULE BY MOUTH THREE TIMES A DAY [name], Alexander Lenna Sciara, DO  Active   ketoconazole (NIZORAL) 2 % shampoo [id number] No Apply 1 application topically 2 (two) times a week. As needed for scalp dermatitis [name], [name], DO Taking Active   Lancets Misc. (ONE TOUCH SURESOFT) MISC [id number] No Use as instructed to check blood sugars twice a day. [name], DO Taking Active   Magnesium Gluconate 500 (27 MG) MG TABS [id number] No Take 1 tablet by mouth every evening.  [name], MD Taking Active            Med Note [facility], [name]   Sat Nov 06, 2014 11:01 AM) Received from: [employer]  melatonin 5 MG TABS [id number] No Take 1 tablet by mouth at bedtime.  [provider] Taking Active   metFORMIN (GLUCOPHAGE) 1000 MG tablet [id number] No Take 1 tablet (1,000 mg total) by mouth 2 (two) times daily with a meal. [name], DO Taking Active   MULTIPLE VITAMIN PO [id number] No Take by mouth. [name], MD Taking Active            Med Note [facility], [name]   Sat Nov 06, 2014 11:01 AM) Received from: [place] test strip  [id number] No Use as instructed to check blood sugars twice a day. [name], DO Taking Active   OZEMPIC, 1 MG/DOSE, 4 MG/3ML SOPN [id number] No Inject 1 mg into the skin once a week. [name], DO Taking Active   rosuvastatin (CRESTOR) 5 MG tablet [id number] No Take 1 tablet (5 mg total) by mouth at bedtime. [name], DO Taking Active   saw palmetto 160 MG capsule [id number] No Take 1 capsule (160 mg total) by mouth 2 (two) times daily. Nobie Putnam  J, DO Taking Active   sertraline (ZOLOFT) 50 MG tablet [id number] No Take 1 tablet (50 mg total) by mouth daily. [name], DO Taking Active   sildenafil (REVATIO) 20 MG tablet [id number]  Take 3-5 tablets daily by mouth as needed [name], [name], MD  Active   tamsulosin (FLOMAX) 0.4 MG CAPS capsule [id number]  Take 2 capsules (0.8 mg total) by mouth daily. [name], MD  Active   traZODone (DESYREL) 50 MG tablet [id number] No Take 1.5 tablets (75 mg total) by mouth at bedtime. [name], DO Taking Active   vitamin B-12 (CYANOCOBALAMIN) 1000 MCG tablet [id number] No Take by mouth. [provider] Taking Active   zinc gluconate 50 MG tablet [id number] No Take 50 mg by mouth daily. [provider] Taking Active           Patient Active Problem List   Diagnosis Date Noted  . Overweight (BMI 25.0-29.9) 06/10/2019  . Major depression, recurrent, chronic ([place]) 11/25/2017  . Incomplete emptying of bladder 05/09/2017  . Alcohol dependence in remission ([place]) 05/06/2017  . Elevated LFTs 05/01/2017  . History of cholelithiasis 01/21/2017  . Screening for colon cancer 01/21/2017  . Hyperlipidemia associated with type 2 diabetes mellitus ([place]) 01/13/2015  . DM (diabetes mellitus), type 2 with neurological complications ([place]) 62/83/6629  . Allergic rhinitis 11/06/2014  . Benign prostatic hyperplasia with urinary obstruction 11/06/2014  . Essential  hypertension 11/06/2014  . ED (erectile dysfunction) of organic origin 11/06/2014  . GERD (gastroesophageal reflux disease) 11/06/2014  . H/O renal calculi 11/06/2014  . Calculus of kidney 11/06/2014  . GAD (generalized anxiety disorder) 12/13/2012  . Abnormal prostate specific antigen 03/05/2012  . Bilateral inguinal hernia 03/05/2012  . 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 Nephrolithiasis, uric acid 03/05/2012  . Nodular prostate with urinary obstruction 03/05/2012    Immunization History  Administered Date(s) Administered  . Fluad Quad(high Dose 65+) 06/15/2020  . Influenza,inj,Quad PF,6+ Mos 03/17/2014, 04/12/2016, 05/06/2017, 06/02/2018, 06/10/2019  . Influenza-Unspecified 04/08/2014, 05/11/2015  . PFIZER(Purple Top)SARS-COV-2 Vaccination 10/27/2019, 11/17/2019, 06/07/2020  . Pneumococcal Conjugate-13 12/15/2019  . Pneumococcal Polysaccharide-23 03/09/2014, 04/22/2014  . Tdap 07/09/2008, 04/08/2014    Conditions to be addressed/monitored: HLD and DMII  Care Plan : PharmD - Medication Assistance  Updates made by Vella Raring, [place] since 09/19/2020 12:00 AM    Problem: Disease Progression     Long-Range Goal: Disease Progression Prevented or Minimized   Start Date: 08/17/2020  Expected End Date: 10/16/2020  Recent Progress: On track  Priority: High  Note:   Current Barriers:  . Financial Barriers in complicated patient with multiple medical conditions including HTN, T2DM, HLD, BPH and depression; patient has [employer] and reports copay for Ozempic is cost prohibitive at this time o Patient APPROVED for patient assistance for [employer] from [employer] for 2022 calendar year  Pharmacist Clinical Goal(s):  Marland Kitchen Over the next 90 days, patient will maintain control of blood sugar as evidenced by A1C <7%  through collaboration with PharmD and provider.   Interventions: . 1:1 collaboration with [name], DO  regarding development and update of comprehensive plan of care as evidenced by provider attestation and co-signature . Inter-disciplinary care team collaboration (see longitudinal plan of care) . Perform chart review o Patient completed Virtual Visit with PCP on 3/10  Type 2 Diabetes . Controlled; current treatment: o Metformin 1000 mg twice daily o Ozempic 1 mg weekly . Reports recent  home fasting blood sugars ranging: 110-120  Medication Assistance . Per review of notes from [place], patient approved for patient assistance for Ozempic from [employer] 08/18/20-07/08/21. . Today patient confirms he received 4 boxes of Ozempic as shipped to the office from [employer] . Will provide update to [facility] CPhT  Hyperlipidemia . Reports taking rosuvastatin 5 mg daily o Reports tolerating well; denies side effects . Lipid panel on 3/3 showed LDL improved to 74 mg/dL (previously 124 mg/dL) since started on rosuvastatin  . Note patient with history of failed simvastatin (stopped due to LFTs, however chart review showed unlikely d/t statin since still elevated >1 year off statin, likely from prior alcohol history)   Patient Goals/Self-Care Activities . Over the next 90 days, patient will:  - take medications as prescribed - check glucose, document, and provide at future appointments - check blood pressure, document, and provide at future appointments - collaborate with provider on medication access solutions  Follow Up Plan: Telephone follow up appointment with care management team member scheduled for: 06/12/2021 at 10:15 AM      Medication Assistance: Ozempic obtained through [employer] medication assistance program.  Enrollment ends 07/08/2021  Patient's preferred pharmacy is:  [facility] ([place]) [place], [place] [address] Phone: [phone] Fax: [place],  [place] [address] Phone: [phone] Fax: [phone]   Follow Up:  Patient agrees to Care Plan and Follow-up.  Plan: 1) Patient denies further medication questions/concerns at this time 2) Patient confirms having contact information for CCM Pharmacist and states will call as needed for medication questions/concerns 3) CM Pharmacist will reach out to patient again in December 2022 to follow up regarding medication assistance re-enrollment  Harlow Asa, PharmD, [place] [phone]

## 2020-09-19 NOTE — Patient Instructions (Signed)
Visit Information  PATIENT GOALS: Goals Addressed            This Visit's Progress   . Pharmacy Goals       Our goal A1c is less than 7%. This corresponds with fasting sugars less than 130 and 2 hour after meal sugars less than 180. Please continue keep a log of results when you check your blood sugar at home.  Our goal bad cholesterol, or LDL, is less than 70 . This is why it is important to continue taking your rosuvastatin   Feel free to call me with any questions or concerns. I look forward to our next call!  Harlow Asa, PharmD, [place] [phone]       The patient verbalized understanding of instructions, educational materials, and care plan provided today and declined offer to receive copy of patient instructions, educational materials, and care plan.   Telephone follow up appointment with care management team member scheduled for: 06/12/2021 at 10:15 AM  Harlow Asa, PharmD, [place] [phone]

## 2020-11-14 ENCOUNTER — Other Ambulatory Visit: Payer: Self-pay | Admitting: Family Medicine

## 2020-11-14 DIAGNOSIS — F339 Major depressive disorder, recurrent, unspecified: Secondary | ICD-10-CM

## 2020-11-14 DIAGNOSIS — E1149 Type 2 diabetes mellitus with other diabetic neurological complication: Secondary | ICD-10-CM

## 2020-11-14 NOTE — Telephone Encounter (Signed)
Requested Prescriptions  Pending Prescriptions Disp Refills  . gabapentin (NEURONTIN) 100 MG capsule [Pharmacy Med Name: GABAPENTIN 100 MG CAPSULE] 90 capsule 1    Sig: TAKE ONE CAPSULE BY MOUTH THREE TIMES A DAY     Neurology: Anticonvulsants - gabapentin Passed - 11/14/2020 12:26 PM      Passed - Valid encounter within last 12 months    Recent Outpatient Visits          2 months ago DM (diabetes mellitus), type 2 with neurological complications [facility])   [place], DO   5 months ago Annual physical exam   [place], DO   11 months ago DM (diabetes mellitus), type 2 with neurological complications [facility])   [place], DO   1 year ago Annual physical exam   [facility] [name], DO   1 year ago DM (diabetes mellitus), type 2 with neurological complications [facility])   [facility] [place] Ranger, [name], DO      Future Appointments            In 7 months [place], [name], DO [facility], [place]   In 7 months [name], [name], MD [place]           . traZODone (DESYREL) 50 MG tablet [Pharmacy Med Name: traZODone 50 MG TABLET] 135 tablet 3    Sig: TAKE ONE AND ONE-HALF TABLETS BY MOUTH AT BEDTIME     Psychiatry: Antidepressants - Serotonin Modulator Passed - 11/14/2020 12:26 PM      Passed - Completed PHQ-2 or PHQ-9 in the last 360 days      Passed - Valid encounter within last 6 months    Recent Outpatient Visits          2 months ago DM (diabetes mellitus), type 2 with neurological complications [facility])   [place], DO   5 months ago Annual physical exam   [place], DO   11 months ago DM (diabetes mellitus), type 2 with neurological complications [facility])   [place], DO   1 year ago Annual physical exam   [facility] [name], DO   1 year ago DM (diabetes mellitus), type 2 with neurological complications [facility])   [facility] Parks Ranger, [name], DO      Future Appointments            In 7 months Parks Ranger, [name], DO [facility], [place]   In 7 months [place], [name], [facility]

## 2020-12-18 ENCOUNTER — Other Ambulatory Visit: Payer: Self-pay | Admitting: Family Medicine

## 2020-12-18 DIAGNOSIS — I10 Essential (primary) hypertension: Secondary | ICD-10-CM

## 2020-12-18 NOTE — Telephone Encounter (Signed)
Requested Prescriptions  Pending Prescriptions Disp Refills  . benazepril (LOTENSIN) 40 MG tablet [Pharmacy Med Name: BENAZEPRIL HCL 40 MG TABLET] 90 tablet 3    Sig: TAKE ONE TABLET BY MOUTH DAILY AT 6 IN THE MORNING     Cardiovascular:  ACE Inhibitors Failed - 12/18/2020  4:48 PM      Failed - Last BP in normal range    BP Readings from Last 1 Encounters:  06/15/20 (!) 151/82         Passed - Cr in normal range and within 180 days    Creat  Date Value Ref Range Status  09/08/2020 0.90 0.70 - 1.25 mg/dL Final    Comment:    For patients >56 years of age, the reference limit for Creatinine is approximately 13% higher for people identified as African-American. .          Passed - K in normal range and within 180 days    Potassium  Date Value Ref Range Status  09/08/2020 4.7 3.5 - 5.3 mmol/L Final         Passed - Patient is not pregnant      Passed - Valid encounter within last 6 months    Recent Outpatient Visits          3 months ago DM (diabetes mellitus), type 2 with neurological complications [facility])   [place], DO   6 months ago Annual physical exam   [facility] [name], DO   1 year ago DM (diabetes mellitus), type 2 with neurological complications [facility])   [place], DO   1 year ago Annual physical exam   [facility] [name], DO   2 years ago DM (diabetes mellitus), type 2 with neurological complications [facility])   [facility] Parks Ranger, [name], DO      Future Appointments            In 5 months Parks Ranger, [name], DO [facility], [place]   In 5 months [place], [name], [facility]

## 2021-01-09 ENCOUNTER — Other Ambulatory Visit: Payer: Self-pay | Admitting: Family Medicine

## 2021-01-09 DIAGNOSIS — F339 Major depressive disorder, recurrent, unspecified: Secondary | ICD-10-CM

## 2021-01-10 ENCOUNTER — Other Ambulatory Visit: Payer: Self-pay | Admitting: *Deleted

## 2021-01-10 DIAGNOSIS — N401 Enlarged prostate with lower urinary tract symptoms: Secondary | ICD-10-CM

## 2021-01-10 DIAGNOSIS — N5201 Erectile dysfunction due to arterial insufficiency: Secondary | ICD-10-CM

## 2021-01-10 MED ORDER — ALLOPURINOL 300 MG PO TABS: 300.0000 mg | ORAL_TABLET | Freq: Every day | ORAL | 3 refills | Status: DC

## 2021-01-11 ENCOUNTER — Ambulatory Visit: Payer: Self-pay | Admitting: Pharmacist

## 2021-01-11 DIAGNOSIS — E1149 Type 2 diabetes mellitus with other diabetic neurological complication: Secondary | ICD-10-CM

## 2021-01-11 NOTE — Patient Instructions (Signed)
Visit Information  PATIENT GOALS:  Goals Addressed             This Visit's Progress    Pharmacy Goals       Our goal A1c is less than 7%. This corresponds with fasting sugars less than 130 and 2 hour after meal sugars less than 180. Please continue keep a log of results when you check your blood sugar at home.  Our goal bad cholesterol, or LDL, is less than 70 . This is why it is important to continue taking your rosuvastatin  Feel free to call me with any questions or concerns. I look forward to our next call!  Harlow Asa, PharmD, [place] [phone]         The patient verbalized understanding of instructions, educational materials, and care plan provided today and declined offer to receive copy of patient instructions, educational materials, and care plan.   Telephone follow up appointment with care management team member scheduled for: 06/12/2021 at 10:15 AM The care management team will reach out to the patient again over the next 7 days.

## 2021-01-11 NOTE — Chronic Care Management (AMB) (Signed)
Chronic Care Management Pharmacy Note  01/11/2021 Name:  Richard KIRK Sr. MRN:  [id number] DOB:  [birth year]   Subjective: Richard Render Tanton Sr. is an 65 y.o. year old male who is a primary patient of [name], DO.  The CCM team was consulted for assistance with disease management and care coordination needs.    Engaged with patient by telephone for follow up visit in response to provider referral for pharmacy case management and/or care coordination services.   Consent to Services:  The patient was given information about Chronic Care Management services, agreed to services, and gave verbal consent prior to initiation of services.  Please see initial visit note for detailed documentation.   Patient Care Team: [name], DO as PCP - General (Family Medicine) Christene Lye, MD (General Surgery) Luciana Axe, NP as Nurse Practitioner (Family Medicine) Laroy Mustard, Virl Diamond, RPH-CPP as Pharmacist (Pharmacist)  Recent office visits: None  Hospital visits: None in previous 6 months  Objective:  Lab Results  Component Value Date   CREATININE 0.90 09/08/2020   CREATININE 1.14 06/08/2020   CREATININE 0.92 06/02/2019    Lab Results  Component Value Date   HGBA1C 5.9 (H) 06/08/2020   Last diabetic Eye exam:  Lab Results  Component Value Date/Time   HMDIABEYEEXA No Retinopathy 08/13/2018 12:00 AM    Last diabetic Foot exam: No results found for: HMDIABFOOTEX     Lab Results  Component Value Date/Time   TSH 1.66 06/08/2020 08:59 AM   TSH 1.78 06/02/2019 08:22 AM   FREET4 1.1 05/26/2018 07:58 AM    CBC Latest Ref Rng & Units 06/08/2020 06/02/2019 05/26/2018  WBC 3.8 - 10.8 Thousand/uL 3.5(L) 4.6 8.0  Hemoglobin 13.2 - 17.1 g/dL 15.2 14.7 14.6  Hematocrit 38.5 - 50.0 % 44.3 41.8 41.8  Platelets 140 - 400 Thousand/uL 153 154 208      Social History   Tobacco Use  Smoking Status Former   Years: 10.00   Pack years: 0.00    Types: Cigarettes   Quit date: 08/08/1999   Years since quitting: 21.4  Smokeless Tobacco Former   BP Readings from Last 3 Encounters:  06/15/20 (!) 151/82  06/15/20 127/79  12/15/19 134/70   Pulse Readings from Last 3 Encounters:  06/15/20 78  06/15/20 72  12/15/19 91   Wt Readings from Last 3 Encounters:  09/15/20 174 lb (78.9 kg)  06/15/20 183 lb (83 kg)  06/15/20 183 lb 9.6 oz (83.3 kg)    Assessment: Review of patient past medical history, allergies, medications, health status, including review of consultants reports, laboratory and other test data, was performed as part of comprehensive evaluation and provision of chronic care management services.   SDOH:  (Social Determinants of Health) assessments and interventions performed: none   CCM Care Plan  Allergies  Allergen Reactions   Bee Venom Swelling    Itching     Medications Reviewed Today     Reviewed by [name], DO (Physician) on 09/15/20 at [phone]  Med List Status: <None>   Medication Order Taking? Sig Documenting Provider Last Dose Status Informant  allopurinol (ZYLOPRIM) 300 MG tablet [id number] No Take 1 tablet (300 mg total) by mouth daily. [name], MD Taking Active   aspirin 81 MG tablet [id number] No Take 1 tablet by mouth daily at 6 (six) AM. [name], [name], MD Taking Active            Med Note (  [name], [name]   Sat Nov 06, 2014 11:01 AM) Received from: [place]:   benazepril (LOTENSIN) 40 MG tablet [id number] No Take 1 tablet (40 mg total) by mouth daily at 6 (six) AM. [name], DO Taking Active   Blood Glucose Calibration (OT ULTRA/FASTTK CNTRL SOLN) SOLN [id number] No Use as instructed to check blood sugars twice a day. [name], DO Taking Active   Blood Glucose Monitoring Suppl (ONE TOUCH ULTRA 2) w/Device KIT [id number] No Use as instructed to check blood sugars twice a day. [name], DO Taking  Active   Calcium Citrate-Vitamin D (CALCIUM CITRATE + D PO) [id number] No Take 1 tablet by mouth every evening. [provider] Taking Active   famotidine (PEPCID) 20 MG tablet [id number] No Take 20 mg by mouth in the morning. [provider] Taking Active   gabapentin (NEURONTIN) 100 MG capsule [id number]  TAKE ONE CAPSULE BY MOUTH THREE TIMES A DAY [name], Alexander Lenna Sciara, DO  Active   ketoconazole (NIZORAL) 2 % shampoo [id number] No Apply 1 application topically 2 (two) times a week. As needed for scalp dermatitis [name], [name], DO Taking Active   Lancets Misc. (ONE TOUCH SURESOFT) MISC [id number] No Use as instructed to check blood sugars twice a day. [name], DO Taking Active   Magnesium Gluconate 500 (27 MG) MG TABS [id number] No Take 1 tablet by mouth every evening.  [name], MD Taking Active            Med Note [facility], [name]   Sat Nov 06, 2014 11:01 AM) Received from: [employer]  melatonin 5 MG TABS [id number] No Take 1 tablet by mouth at bedtime.  [provider] Taking Active   metFORMIN (GLUCOPHAGE) 1000 MG tablet [id number] No Take 1 tablet (1,000 mg total) by mouth 2 (two) times daily with a meal. [name], DO Taking Active   MULTIPLE VITAMIN PO [id number] No Take by mouth. [name], MD Taking Active            Med Note [facility], [name]   Sat Nov 06, 2014 11:01 AM) Received from: [place] test strip [id number] No Use as instructed to check blood sugars twice a day. [name], DO Taking Active   OZEMPIC, 1 MG/DOSE, 4 MG/3ML SOPN [id number] No Inject 1 mg into the skin once a week. [name], DO Taking Active   rosuvastatin (CRESTOR) 5 MG tablet [id number] No Take 1 tablet (5 mg total) by mouth at bedtime. [name], DO Taking Active   saw palmetto 160 MG capsule [id number] No Take 1 capsule (160 mg  total) by mouth 2 (two) times daily. [name], DO Taking Active   sertraline (ZOLOFT) 50 MG tablet [id number] No Take 1 tablet (50 mg total) by mouth daily. [name], DO Taking Active   sildenafil (REVATIO) 20 MG tablet [id number]  Take 3-5 tablets daily by mouth as needed [name], [name], MD  Active   tamsulosin (FLOMAX) 0.4 MG CAPS capsule [id number]  Take 2 capsules (0.8 mg total) by mouth daily. [name], MD  Active   traZODone (DESYREL) 50 MG tablet [id number] No Take 1.5 tablets (75 mg total) by mouth at bedtime. [name], DO Taking Active   vitamin B-12 (CYANOCOBALAMIN) 1000 MCG tablet [id number] No Take by mouth. [provider] Taking Active   zinc  gluconate 50 MG tablet [id number] No Take 50 mg by mouth daily. [provider] Taking Active             Patient Active Problem List   Diagnosis Date Noted   Overweight (BMI 25.0-29.9) 06/10/2019   Major depression, recurrent, chronic ([place]) 11/25/2017   Incomplete emptying of bladder 05/09/2017   Alcohol dependence in remission ([place]) 05/06/2017   Elevated LFTs 05/01/2017   History of cholelithiasis 01/21/2017   Screening for colon cancer 01/21/2017   Hyperlipidemia associated with type 2 diabetes mellitus ([place]) 01/13/2015   DM (diabetes mellitus), type 2 with neurological complications ([place]) 77/41/2878   Allergic rhinitis 11/06/2014   Benign prostatic hyperplasia with urinary obstruction 11/06/2014   Essential hypertension 11/06/2014   ED (erectile dysfunction) of organic origin 11/06/2014   GERD (gastroesophageal reflux disease) 11/06/2014   H/O renal calculi 11/06/2014   Calculus of kidney 11/06/2014   GAD (generalized anxiety disorder) 12/13/2012   Abnormal prostate specific antigen 03/05/2012   Bilateral inguinal hernia 03/05/2012   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   Nephrolithiasis, uric acid 03/05/2012   Nodular prostate with  urinary obstruction 03/05/2012    Immunization History  Administered Date(s) Administered   Fluad Quad(high Dose 65+) 06/15/2020   Influenza,inj,Quad PF,6+ Mos 03/17/2014, 04/12/2016, 05/06/2017, 06/02/2018, 06/10/2019   Influenza-Unspecified 04/08/2014, 05/11/2015   PFIZER(Purple Top)SARS-COV-2 Vaccination 10/27/2019, 11/17/2019, 06/07/2020   Pneumococcal Conjugate-13 12/15/2019   Pneumococcal Polysaccharide-23 03/09/2014, 04/22/2014   Tdap 07/09/2008, 04/08/2014    Conditions to be addressed/monitored: HTN, HLD, and DMII  Care Plan : PharmD - Medication Assistance  Updates made by Vella Raring, RPH-CPP since 01/11/2021 12:00 AM     Problem: Disease Progression      Long-Range Goal: Disease Progression Prevented or Minimized   Start Date: 08/17/2020  Expected End Date: 10/16/2020  Recent Progress: On track  Priority: High  Note:   Current Barriers:  Financial Barriers in complicated patient with multiple medical conditions including HTN, T2DM, HLD, BPH and depression; patient has [employer] and reports copay for Ozempic is cost prohibitive at this time Patient APPROVED for patient assistance for [employer] from [employer] for 2022 calendar year  Pharmacist Clinical Goal(s):  Over the next 90 days, patient will maintain control of blood sugar as evidenced by A1C <7%  through collaboration with PharmD and provider.   Interventions: 1:1 collaboration with [name], DO regarding development and update of comprehensive plan of care as evidenced by provider attestation and co-signature Inter-disciplinary care team collaboration (see longitudinal plan of care) Today patient reports recently started to have overnight sweating and leg cramps Reports has chronically been taking an OTC potassium 99 mg supplement daily and his magnesium supplement once daily. Reports increased both the potassium and magnesium supplements, taking each now  twice daily, ~1 week ago due to these overnight symptoms.  Advise patient against increasing the potassium supplement without first checking with provider as past potassium levels have been on higher end of normal range Reports has also check his blood sugar overnight and had readings of 74 and 75 Will collaborate with PCP regarding recent overnight sweating/leg cramps  Type 2 Diabetes Controlled; current treatment: Metformin 1000 mg twice daily Ozempic 1 mg weekly Reports recent morning fasting readings have been ~140, but found overnight blood sugar readings of 74 and 75 with overnight sweating Will collaborate with PCP regarding patient's diabetes medication managment  Medication Adherence Assess adherence to metformin (as identified by health  plan).  Denies barriers to obtaining taking or missed doses Reports previously had a surplus of metformin that he has been using up Encourage patient to continue adherence to medications and use of weekly pillbox   Patient Goals/Self-Care Activities Over the next 90 days, patient will:  - take medications as prescribed - check glucose, document, and provide at future appointments - check blood pressure, document, and provide at future appointments - collaborate with provider on medication access solutions  Follow Up Plan: Telephone follow up appointment with care management team member scheduled for: 06/12/2021 at 10:15 AM The care management team will reach out to the patient again over the next 7 days.        Patient's preferred pharmacy is:  [facility] [place] [place] [address] Phone: [phone] Fax: [phone]  [id number] - [place], [place] - [address] [place] [address] Phone: [phone] Fax: [phone]   Follow Up:  Patient agrees to Care Plan and Follow-up.  Harlow Asa, PharmD,  [place], CPP Clinical Pharmacist [facility] [phone]

## 2021-01-12 ENCOUNTER — Other Ambulatory Visit: Payer: Self-pay

## 2021-01-12 DIAGNOSIS — E1149 Type 2 diabetes mellitus with other diabetic neurological complication: Secondary | ICD-10-CM

## 2021-01-12 MED ORDER — ONETOUCH ULTRA VI STRP: ORAL_STRIP | 12 refills | Status: AC

## 2021-01-13 ENCOUNTER — Ambulatory Visit (INDEPENDENT_AMBULATORY_CARE_PROVIDER_SITE_OTHER): Payer: Medicare HMO | Admitting: Pharmacist

## 2021-01-13 DIAGNOSIS — E1149 Type 2 diabetes mellitus with other diabetic neurological complication: Secondary | ICD-10-CM | POA: Diagnosis not present

## 2021-01-13 MED ORDER — METFORMIN HCL 1000 MG PO TABS: ORAL_TABLET | ORAL | 2 refills | Status: DC

## 2021-01-13 NOTE — Patient Instructions (Addendum)
Visit Information  PATIENT GOALS:  Goals Addressed             This Visit's Progress    Pharmacy Goals       Our goal A1c is less than 7%. This corresponds with fasting sugars less than 130 and 2 hour after meal sugars less than 180. Please continue keep a log of results when you check your blood sugar at home.  Our goal bad cholesterol, or LDL, is less than 70 . This is why it is important to continue taking your rosuvastatin  Feel free to call me with any questions or concerns. I look forward to our next call!   Harlow Asa, PharmD, [place] [phone]         The patient verbalized understanding of instructions, educational materials, and care plan provided today and declined offer to receive copy of patient instructions, educational materials, and care plan.   Telephone follow up appointment with care management team member scheduled for: 8/3 at 9:15 am

## 2021-01-13 NOTE — Chronic Care Management (AMB) (Signed)
Chronic Care Management Pharmacy Note  01/13/2021 Name:  Richard BOLLA Sr. MRN:  [id number] DOB:  March 12, 1956   Subjective: Richard Render Newman Sr. is an 65 y.o. year old male who is a primary patient of [name], DO.  The CCM team was consulted for assistance with disease management and care coordination needs.    Engaged with patient by telephone for follow up visit in response to provider referral for pharmacy case management and/or care coordination services.   Consent to Services:  The patient was given information about Chronic Care Management services, agreed to services, and gave verbal consent prior to initiation of services.  Please see initial visit note for detailed documentation.   Patient Care Team: [name], DO as PCP - General (Family Medicine) Christene Lye, MD (General Surgery) Luciana Axe, NP as Nurse Practitioner (Family Medicine) Dyllan Hughett, Virl Diamond, RPH-CPP as Pharmacist (Pharmacist)  Recent office visits: None  Hospital visits: None in previous 6 months  Objective:  Lab Results  Component Value Date   CREATININE 0.90 09/08/2020   CREATININE 1.14 06/08/2020   CREATININE 0.92 06/02/2019    Lab Results  Component Value Date   HGBA1C 5.9 (H) 06/08/2020   Last diabetic Eye exam:  Lab Results  Component Value Date/Time   HMDIABEYEEXA No Retinopathy 08/13/2018 12:00 AM    Last diabetic Foot exam: No results found for: HMDIABFOOTEX      Component Value Date/Time   CHOL 136 09/08/2020 0813   CHOL 226 (H) 01/20/2015 1501   TRIG 99 09/08/2020 0813   HDL 44 09/08/2020 0813   HDL 44 01/20/2015 1501   CHOLHDL 3.1 09/08/2020 0813   LDLCALC 74 09/08/2020 0813     Social History   Tobacco Use  Smoking Status Former   Years: 10.00   Pack years: 0.00   Types: Cigarettes   Quit date: 08/08/1999   Years since quitting: 21.4  Smokeless Tobacco Former   BP Readings from Last 3 Encounters:  06/15/20 (!)  151/82  06/15/20 127/79  12/15/19 134/70   Pulse Readings from Last 3 Encounters:  06/15/20 78  06/15/20 72  12/15/19 91   Wt Readings from Last 3 Encounters:  09/15/20 174 lb (78.9 kg)  06/15/20 183 lb (83 kg)  06/15/20 183 lb 9.6 oz (83.3 kg)    Assessment: Review of patient past medical history, allergies, medications, health status, including review of consultants reports, laboratory and other test data, was performed as part of comprehensive evaluation and provision of chronic care management services.   SDOH:  (Social Determinants of Health) assessments and interventions performed: none   CCM Care Plan  Allergies  Allergen Reactions   Bee Venom Swelling    Itching     Medications Reviewed Today     Reviewed by [name], DO (Physician) on 09/15/20 at [phone]  Med List Status: <None>   Medication Order Taking? Sig Documenting Provider Last Dose Status Informant  allopurinol (ZYLOPRIM) 300 MG tablet [id number] No Take 1 tablet (300 mg total) by mouth daily. [name], MD Taking Active   aspirin 81 MG tablet [id number] No Take 1 tablet by mouth daily at 6 (six) AM. [name], [name], MD Taking Active            Med Note [facility], [name]   Sat Nov 06, 2014 11:01 AM) Received from: [place]:   benazepril (LOTENSIN) 40 MG tablet [id number] No Take 1  tablet (40 mg total) by mouth daily at 6 (six) AM. [name], DO Taking Active   Blood Glucose Calibration (OT ULTRA/FASTTK CNTRL SOLN) SOLN [id number] No Use as instructed to check blood sugars twice a day. [name], DO Taking Active   Blood Glucose Monitoring Suppl (ONE TOUCH ULTRA 2) w/Device KIT [id number] No Use as instructed to check blood sugars twice a day. [name], DO Taking Active   Calcium Citrate-Vitamin D (CALCIUM CITRATE + D PO) [id number] No Take 1 tablet by mouth every evening. [provider] Taking Active    famotidine (PEPCID) 20 MG tablet [id number] No Take 20 mg by mouth in the morning. [provider] Taking Active   gabapentin (NEURONTIN) 100 MG capsule [id number]  TAKE ONE CAPSULE BY MOUTH THREE TIMES A DAY [name], Alexander Lenna Sciara, DO  Active   ketoconazole (NIZORAL) 2 % shampoo [id number] No Apply 1 application topically 2 (two) times a week. As needed for scalp dermatitis [name], [name], DO Taking Active   Lancets Misc. (ONE TOUCH SURESOFT) MISC [id number] No Use as instructed to check blood sugars twice a day. [name], DO Taking Active   Magnesium Gluconate 500 (27 MG) MG TABS [id number] No Take 1 tablet by mouth every evening.  [name], MD Taking Active            Med Note [facility], [name]   Sat Nov 06, 2014 11:01 AM) Received from: [employer]  melatonin 5 MG TABS [id number] No Take 1 tablet by mouth at bedtime.  [provider] Taking Active   metFORMIN (GLUCOPHAGE) 1000 MG tablet [id number] No Take 1 tablet (1,000 mg total) by mouth 2 (two) times daily with a meal. [name], DO Taking Active   MULTIPLE VITAMIN PO [id number] No Take by mouth. [name], MD Taking Active            Med Note [facility], [name]   Sat Nov 06, 2014 11:01 AM) Received from: [place] test strip [id number] No Use as instructed to check blood sugars twice a day. [name], DO Taking Active   OZEMPIC, 1 MG/DOSE, 4 MG/3ML SOPN [id number] No Inject 1 mg into the skin once a week. [name], DO Taking Active   rosuvastatin (CRESTOR) 5 MG tablet [id number] No Take 1 tablet (5 mg total) by mouth at bedtime. [name], DO Taking Active   saw palmetto 160 MG capsule [id number] No Take 1 capsule (160 mg total) by mouth 2 (two) times daily. [name], DO Taking Active   sertraline (ZOLOFT) 50 MG tablet [id number] No Take 1 tablet (50 mg total)  by mouth daily. [name], DO Taking Active   sildenafil (REVATIO) 20 MG tablet [id number]  Take 3-5 tablets daily by mouth as needed [name], [name], MD  Active   tamsulosin (FLOMAX) 0.4 MG CAPS capsule [id number]  Take 2 capsules (0.8 mg total) by mouth daily. [name], MD  Active   traZODone (DESYREL) 50 MG tablet [id number] No Take 1.5 tablets (75 mg total) by mouth at bedtime. [name], DO Taking Active   vitamin B-12 (CYANOCOBALAMIN) 1000 MCG tablet [id number] No Take by mouth. [provider] Taking Active   zinc gluconate 50 MG tablet [id number] No Take 50 mg by mouth daily. [provider] Taking Active  Patient Active Problem List   Diagnosis Date Noted   Overweight (BMI 25.0-29.9) 06/10/2019   Major depression, recurrent, chronic ([place]) 11/25/2017   Incomplete emptying of bladder 05/09/2017   Alcohol dependence in remission ([place]) 05/06/2017   Elevated LFTs 05/01/2017   History of cholelithiasis 01/21/2017   Screening for colon cancer 01/21/2017   Hyperlipidemia associated with type 2 diabetes mellitus ([place]) 01/13/2015   DM (diabetes mellitus), type 2 with neurological complications ([place]) 12/81/1886   Allergic rhinitis 11/06/2014   Benign prostatic hyperplasia with urinary obstruction 11/06/2014   Essential hypertension 11/06/2014   ED (erectile dysfunction) of organic origin 11/06/2014   GERD (gastroesophageal reflux disease) 11/06/2014   H/O renal calculi 11/06/2014   Calculus of kidney 11/06/2014   GAD (generalized anxiety disorder) 12/13/2012   Abnormal prostate specific antigen 03/05/2012   Bilateral inguinal hernia 03/05/2012   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   Nephrolithiasis, uric acid 03/05/2012   Nodular prostate with urinary obstruction 03/05/2012    Immunization History  Administered Date(s) Administered   Fluad Quad(high Dose 65+) 06/15/2020   Influenza,inj,Quad PF,6+  Mos 03/17/2014, 04/12/2016, 05/06/2017, 06/02/2018, 06/10/2019   Influenza-Unspecified 04/08/2014, 05/11/2015   PFIZER(Purple Top)SARS-COV-2 Vaccination 10/27/2019, 11/17/2019, 06/07/2020   Pneumococcal Conjugate-13 12/15/2019   Pneumococcal Polysaccharide-23 03/09/2014, 04/22/2014   Tdap 07/09/2008, 04/08/2014    Conditions to be addressed/monitored: HLD and DMII  Care Plan : PharmD - Medication Assistance  Updates made by Vella Raring, RPH-CPP since 01/13/2021 12:00 AM     Problem: Disease Progression      Long-Range Goal: Disease Progression Prevented or Minimized   Start Date: 08/17/2020  Expected End Date: 10/16/2020  This Visit's Progress: On track  Recent Progress: On track  Priority: High  Note:   Current Barriers:  Financial Barriers in complicated patient with multiple medical conditions including HTN, T2DM, HLD, BPH and depression; patient has [employer] and reports copay for Ozempic is cost prohibitive at this time Patient APPROVED for patient assistance for [employer] from [employer] for 2022 calendar year  Pharmacist Clinical Goal(s):  Over the next 90 days, patient will maintain control of blood sugar as evidenced by A1C <7%  through collaboration with PharmD and provider.   Interventions: 1:1 collaboration with [name], DO regarding development and update of comprehensive plan of care as evidenced by provider attestation and co-signature Inter-disciplinary care team collaboration (see longitudinal plan of care) Collaborated with PCP regarding patient's recent overnight sweating/leg cramps with lower overnight blood sugar readings of 74 & 75 (see encounter from 01/11/2021) and his diabetes medication management Note patient's latest A1C result: Lab Results  Component Value Date   HGBA1C 5.9 (H) 06/08/2020  Provider agreed with plan to decrease patient's metformin dose from 1000 mg twice daily to metformin 500 mg  twice daily and to have patient complete lab work (BMET, magnesium and A1C) in office Follow up with patient regarding plan for lab work. Patient agrees and lab appointment scheduled. Lab orders placed by CPP.  Type 2 Diabetes Controlled; current treatment: Metformin 1000 mg twice daily Ozempic 1 mg weekly Reports recent readings: This morning: 122 Yesterday: morning fasting: 125; bedtime: 113 Counsel patient on plan to reduce metformin dose to 500 mg twice daily. Patient agrees and states he prefers to cut 1,000 mg tablets in half for dose, rather than switch to 500 mg strength CPP sends Rx with updated direction to pharmacy   Medication Adherence Assess adherence to metformin (as identified by  health plan).  Denies barriers to obtaining taking or missed doses Reports previously had a surplus of metformin that he has been using up Encourage patient to continue adherence to medications and use of weekly pillbox   Patient Goals/Self-Care Activities Over the next 90 days, patient will:  - take medications as prescribed - check glucose, document, and provide at future appointments - check blood pressure, document, and provide at future appointments - collaborate with provider on medication access solutions  Follow Up Plan:  Lab appointment at office scheduled for 7/11 at 8:15 am Telephone follow up appointment with care management team member scheduled for: 8/3 at 9:15 am The care management team will reach out to the patient again over the next 7 days.        Medication Assistance: Patient APPROVED for patient assistance for Ozempic from [employer] for 2022 calendar year  Patient's preferred pharmacy is:  [facility] ([place] [address], [place] [address] Phone: [phone] Fax: [phone]  [facility] [id number] [address], [place]  [address] Phone: [phone] Fax: [phone]   Follow Up:  Patient agrees to Care Plan and Follow-up.  Harlow Asa, PharmD, Para March, CPP Clinical Pharmacist [facility] [phone]

## 2021-01-16 ENCOUNTER — Other Ambulatory Visit: Payer: Self-pay

## 2021-01-16 ENCOUNTER — Other Ambulatory Visit: Payer: Medicare HMO

## 2021-01-16 DIAGNOSIS — E1149 Type 2 diabetes mellitus with other diabetic neurological complication: Secondary | ICD-10-CM | POA: Diagnosis not present

## 2021-01-17 LAB — BASIC METABOLIC PANEL
BUN: 16 mg/dL (ref 7–25)
CO2: 28 mmol/L (ref 20–32)
Calcium: 9.6 mg/dL (ref 8.6–10.3)
Chloride: 102 mmol/L (ref 98–110)
Creat: 0.98 mg/dL (ref 0.70–1.35)
Glucose, Bld: 142 mg/dL — ABNORMAL HIGH (ref 65–99)
Potassium: 5.2 mmol/L (ref 3.5–5.3)
Sodium: 138 mmol/L (ref 135–146)

## 2021-01-17 LAB — HEMOGLOBIN A1C
Hgb A1c MFr Bld: 5.4 % of total Hgb (ref <5.7)
Mean Plasma Glucose: 108 mg/dL
eAG (mmol/L): 6 mmol/L

## 2021-01-17 LAB — MAGNESIUM: Magnesium: 1.7 mg/dL (ref 1.5–2.5)

## 2021-01-23 DIAGNOSIS — E113393 Type 2 diabetes mellitus with moderate nonproliferative diabetic retinopathy without macular edema, bilateral: Secondary | ICD-10-CM | POA: Diagnosis not present

## 2021-01-23 DIAGNOSIS — H524 Presbyopia: Secondary | ICD-10-CM | POA: Diagnosis not present

## 2021-01-26 ENCOUNTER — Other Ambulatory Visit: Payer: Self-pay | Admitting: Family Medicine

## 2021-01-26 DIAGNOSIS — E1149 Type 2 diabetes mellitus with other diabetic neurological complication: Secondary | ICD-10-CM

## 2021-02-03 ENCOUNTER — Other Ambulatory Visit: Payer: Self-pay | Admitting: *Deleted

## 2021-02-03 DIAGNOSIS — N401 Enlarged prostate with lower urinary tract symptoms: Secondary | ICD-10-CM

## 2021-02-03 DIAGNOSIS — R350 Frequency of micturition: Secondary | ICD-10-CM

## 2021-02-03 DIAGNOSIS — N5201 Erectile dysfunction due to arterial insufficiency: Secondary | ICD-10-CM

## 2021-02-03 MED ORDER — SILDENAFIL CITRATE 20 MG PO TABS: ORAL_TABLET | ORAL | 1 refills | Status: DC

## 2021-02-08 ENCOUNTER — Ambulatory Visit (INDEPENDENT_AMBULATORY_CARE_PROVIDER_SITE_OTHER): Payer: Medicare HMO | Admitting: Pharmacist

## 2021-02-08 DIAGNOSIS — E1149 Type 2 diabetes mellitus with other diabetic neurological complication: Secondary | ICD-10-CM | POA: Diagnosis not present

## 2021-02-08 NOTE — Chronic Care Management (AMB) (Signed)
Chronic Care Management Pharmacy Note  02/08/2021 Name:  Richard HEIDINGER Sr. MRN:  [id number] DOB:  November 06, 1955   Subjective: Richard Render Eyster Sr. is an 65 y.o. year old male who is a primary patient of [name], DO.  The CCM team was consulted for assistance with disease management and care coordination needs.    Engaged with patient by telephone for follow up visit in response to provider referral for pharmacy case management and/or care coordination services.   Consent to Services:  The patient was given information about Chronic Care Management services, agreed to services, and gave verbal consent prior to initiation of services.  Please see initial visit note for detailed documentation.   Patient Care Team: [name], DO as PCP - General (Family Medicine) Christene Lye, MD (General Surgery) Luciana Axe, NP as Nurse Practitioner (Family Medicine) [place], Virl Diamond, RPH-CPP as Pharmacist (Pharmacist)  Recent office visits: None  Hospital visits: None in previous 6 months  Objective:  Lab Results  Component Value Date   CREATININE 0.98 01/16/2021   CREATININE 0.90 09/08/2020   CREATININE 1.14 06/08/2020   Lab Results  Component Value Date   CREATININE 0.98 01/16/2021   BUN 16 01/16/2021   NA 138 01/16/2021   K 5.2 01/16/2021   CL 102 01/16/2021   CO2 28 01/16/2021    Lab Results  Component Value Date   HGBA1C 5.4 01/16/2021   Last diabetic Eye exam:  Lab Results  Component Value Date/Time   HMDIABEYEEXA No Retinopathy 08/13/2018 12:00 AM    Last diabetic Foot exam: No results found for: HMDIABFOOTEX      Component Value Date/Time   CHOL 136 09/08/2020 0813   CHOL 226 (H) 01/20/2015 1501   TRIG 99 09/08/2020 0813   HDL 44 09/08/2020 0813   HDL 44 01/20/2015 1501   CHOLHDL 3.1 09/08/2020 0813   LDLCALC 74 09/08/2020 0813    Hepatic Function Latest Ref Rng & Units 09/08/2020 06/08/2020 06/02/2019  Total  Protein 6.1 - 8.1 g/dL 7.1 7.2 7.1  Albumin 3.6 - 5.1 g/dL - - -  AST 10 - 35 U/L 34 39(H) 109(H)  ALT 9 - 46 U/L 24 33 79(H)  Alk Phosphatase 40 - 115 U/L - - -  Total Bilirubin 0.2 - 1.2 mg/dL 0.6 0.6 0.9    Social History   Tobacco Use  Smoking Status Former   Years: 10.00   Types: Cigarettes   Quit date: 08/08/1999   Years since quitting: 21.5  Smokeless Tobacco Former   BP Readings from Last 3 Encounters:  06/15/20 (!) 151/82  06/15/20 127/79  12/15/19 134/70   Pulse Readings from Last 3 Encounters:  06/15/20 78  06/15/20 72  12/15/19 91   Wt Readings from Last 3 Encounters:  09/15/20 174 lb (78.9 kg)  06/15/20 183 lb (83 kg)  06/15/20 183 lb 9.6 oz (83.3 kg)    Assessment: Review of patient past medical history, allergies, medications, health status, including review of consultants reports, laboratory and other test data, was performed as part of comprehensive evaluation and provision of chronic care management services.   SDOH:  (Social Determinants of Health) assessments and interventions performed: none   CCM Care Plan  Allergies  Allergen Reactions   Bee Venom Swelling    Itching     Medications Reviewed Today     Reviewed by Vella Raring, RPH-CPP (Pharmacist) on 02/08/21 at 0926  Med List Status: <None>  Medication Order Taking? Sig Documenting Provider Last Dose Status Informant  allopurinol (ZYLOPRIM) 300 MG tablet [id number]  Take 1 tablet (300 mg total) by mouth daily. [name], MD  Active   aspirin 81 MG tablet [id number]  Take 1 tablet by mouth daily at 6 (six) AM. [name], [name], MD  Active            Med Note [facility], [name]   Sat Nov 06, 2014 11:01 AM) Received from: [place]:   benazepril (LOTENSIN) 40 MG tablet [id number]  TAKE ONE TABLET BY MOUTH DAILY AT 6 IN THE MORNING [name], [name], DO  Active   Blood Glucose Calibration (OT ULTRA/FASTTK CNTRL SOLN) SOLN [id number]  Use  as instructed to check blood sugars twice a day. [name], [name], DO  Active   Blood Glucose Monitoring Suppl (ONE TOUCH ULTRA 2) w/Device KIT [id number]  Use as instructed to check blood sugars twice a day. [name], DO  Active   famotidine (PEPCID) 20 MG tablet [id number]  Take 20 mg by mouth in the morning. [provider]  Active   gabapentin (NEURONTIN) 100 MG capsule [id number]  TAKE ONE CAPSULE BY MOUTH THREE TIMES A DAY [name], Alexander Lenna Sciara, DO  Active   ketoconazole (NIZORAL) 2 % shampoo [id number]  Apply 1 application topically 2 (two) times a week. As needed for scalp dermatitis [name], [name], DO  Active   Lancets Misc. ([place]) MISC [id number]  Use as instructed to check blood sugars twice a day. [name], DO  Active   Magnesium Gluconate 500 (27 MG) MG TABS [id number]  Take 1 tablet by mouth every evening.  [name], MD  Active            Med Note [facility], [name]   Sat Nov 06, 2014 11:01 AM) Received from: [employer]  melatonin 5 MG TABS [id number]  Take 1 tablet by mouth at bedtime.  [provider]  Active   metFORMIN (GLUCOPHAGE) 1000 MG tablet [id number] Yes Take 1/2 tablet (500 mg total) by mouth 2 (two) times daily with meals [name], DO Taking Active   MULTIPLE VITAMIN PO [id number]  Take by mouth. [name], MD  Active            Med Note [facility], [name]   Sat Nov 06, 2014 11:01 AM) Received from: [place] test strip [id number]  Use as instructed to check blood sugars twice a day. [name], DO  Active   OZEMPIC, 1 MG/DOSE, 4 MG/3ML [place] [facility] [id number] Yes Inject 1 mg into the skin once a week. [name], DO Taking Active   Potassium 99 MG TABS [id number]  Take by mouth. [provider]  Active   rosuvastatin (CRESTOR) 5 MG tablet [id number]  Take 1 tablet (5 mg total) by  mouth at bedtime. [name], DO  Active   saw palmetto 160 MG capsule [id number]  Take 1 capsule (160 mg total) by mouth 2 (two) times daily. [name], DO  Active   sertraline (ZOLOFT) 50 MG tablet [id number]  TAKE ONE TABLET BY MOUTH DAILY [name], DO  Active   sildenafil (REVATIO) 20 MG tablet [id number]  Take 3-5 tablets daily by mouth as needed [name], MD  Active   tamsulosin (FLOMAX) 0.4 MG CAPS capsule [id number]  Take 2 capsules (0.8  mg total) by mouth daily. [name], MD  Active   traZODone (DESYREL) 50 MG tablet [id number]  TAKE ONE AND ONE-HALF TABLETS BY MOUTH AT BEDTIME [name], DO  Active   vitamin B-12 (CYANOCOBALAMIN) 1000 MCG tablet [id number]  Take by mouth. [provider]  Active   zinc gluconate 50 MG tablet [id number]  Take 50 mg by mouth daily. [provider]  Active             Patient Active Problem List   Diagnosis Date Noted   Overweight (BMI 25.0-29.9) 06/10/2019   Major depression, recurrent, chronic ([place]) 11/25/2017   Incomplete emptying of bladder 05/09/2017   Alcohol dependence in remission ([place]) 05/06/2017   Elevated LFTs 05/01/2017   History of cholelithiasis 01/21/2017   Screening for colon cancer 01/21/2017   Hyperlipidemia associated with type 2 diabetes mellitus ([place]) 01/13/2015   DM (diabetes mellitus), type 2 with neurological complications ([place]) 30/16/0109   Allergic rhinitis 11/06/2014   Benign prostatic hyperplasia with urinary obstruction 11/06/2014   Essential hypertension 11/06/2014   ED (erectile dysfunction) of organic origin 11/06/2014   GERD (gastroesophageal reflux disease) 11/06/2014   H/O renal calculi 11/06/2014   Calculus of kidney 11/06/2014   GAD (generalized anxiety disorder) 12/13/2012   Abnormal prostate specific antigen 03/05/2012   Bilateral inguinal hernia 03/05/2012   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    Nephrolithiasis, uric acid 03/05/2012   Nodular prostate with urinary obstruction 03/05/2012    Immunization History  Administered Date(s) Administered   Fluad Quad(high Dose 65+) 06/15/2020   Influenza,inj,Quad PF,6+ Mos 03/17/2014, 04/12/2016, 05/06/2017, 06/02/2018, 06/10/2019   Influenza-Unspecified 04/08/2014, 05/11/2015   PFIZER(Purple Top)SARS-COV-2 Vaccination 10/27/2019, 11/17/2019, 06/07/2020   Pneumococcal Conjugate-13 12/15/2019   Pneumococcal Polysaccharide-23 03/09/2014, 04/22/2014   Tdap 07/09/2008, 04/08/2014    Conditions to be addressed/monitored: HLD and DMII  Care Plan : PharmD - Medication Assistance  Updates made by Vella Raring, RPH-CPP since 02/08/2021 12:00 AM     Problem: Disease Progression      Long-Range Goal: Disease Progression Prevented or Minimized   Start Date: 08/17/2020  Expected End Date: 10/16/2020  This Visit's Progress: On track  Recent Progress: On track  Priority: High  Note:   Current Barriers:  Financial Barriers in complicated patient with multiple medical conditions including HTN, T2DM, HLD, BPH and depression; patient has [employer] and reports copay for Ozempic is cost prohibitive at this time Patient APPROVED for patient assistance for [employer] from [employer] for 2022 calendar year  Pharmacist Clinical Goal(s):  Over the next 90 days, patient will maintain control of blood sugar as evidenced by A1C <7%  through collaboration with PharmD and provider.   Interventions: 1:1 collaboration with [name], DO regarding development and update of comprehensive plan of care as evidenced by provider attestation and co-signature Inter-disciplinary care team collaboration (see longitudinal plan of care) Collaborated with PCP on 7/8 regarding patient's recent overnight sweating/leg cramps with lower overnight blood sugar readings.  Metformin dose decreased and orders for BMET, magnesium  and A1C lab work placed Patient completed labs on 7/11 Kidney function and magnesium in normal range.  Potassium on higher end, but within normal range A1C 5.4% Today patient confirms reviewed lab results and note with labs from PCP in MyChart Reports overnight leg cramps have improved  Type 2 Diabetes Controlled; current treatment: Metformin 1000 mg- 1/2 tablet (500 mg) twice daily Ozempic 1 mg weekly Reports recent  fasting readings ranging: 105-131 Denies further episodes of hypoglycemia/overnight sweating  Medication Assistance: Reports currently has 7 Ozempic pens remaining. Will follow up with patient for medication assistance re-enrollement as scheduled in December   Patient Goals/Self-Care Activities Over the next 90 days, patient will:  - take medications as prescribed - check glucose, document, and provide at future appointments - check blood pressure, document, and provide at future appointments - collaborate with provider on medication access solutions  Follow Up Plan:  Telephone follow up appointment with care management team member scheduled for: 06/12/2021 at 10:15 AM       Medication Assistance:Patient APPROVED for patient assistance for Ozempic from [employer] for 2022 calendar year   Patient's preferred pharmacy is:  [facility] ([place] [address], [place] [address] Phone: [phone] Fax: [phone]  [facility] [id number], [place] [address] Phone: [phone] Fax: [phone]   Follow Up:  Patient agrees to Care Plan and Follow-up.  Wallace Cullens, PharmD, Para March, CPP Clinical Pharmacist [facility] [phone]

## 2021-02-08 NOTE — Patient Instructions (Signed)
Visit Information  PATIENT GOALS:  Goals Addressed             This Visit's Progress    Pharmacy Goals       Our goal A1c is less than 7%. This corresponds with fasting sugars less than 130 and 2 hour after meal sugars less than 180. Please continue keep a log of results when you check your blood sugar at home.  Our goal bad cholesterol, or LDL, is less than 70 . This is why it is important to continue taking your rosuvastatin  Feel free to call me with any questions or concerns. I look forward to our next call!  Wallace Cullens, PharmD, [place] [phone]        The patient verbalized understanding of instructions, educational materials, and care plan provided today and declined offer to receive copy of patient instructions, educational materials, and care plan.   Telephone follow up appointment with care management team member scheduled for: 06/12/2021 at 10:15 AM

## 2021-02-15 ENCOUNTER — Telehealth: Payer: Self-pay | Admitting: Family Medicine

## 2021-02-15 NOTE — Telephone Encounter (Signed)
Left message for patient to call back and schedule Medicare Annual Wellness Visit (AWV) to be done virtually or by telephone.  No hx of AWV eligible as of 07/09/20  Please schedule at anytime with [facility].      40 Minutes appointment   Any questions, please call me at [phone]

## 2021-02-28 ENCOUNTER — Ambulatory Visit (INDEPENDENT_AMBULATORY_CARE_PROVIDER_SITE_OTHER): Payer: Medicare HMO

## 2021-02-28 VITALS — Ht 71.0 in | Wt 173.0 lb

## 2021-02-28 DIAGNOSIS — Z0000 Encounter for general adult medical examination without abnormal findings: Secondary | ICD-10-CM | POA: Diagnosis not present

## 2021-02-28 NOTE — Progress Notes (Signed)
I connected with Richard Dupre Sr today by telephone and verified that I am speaking with the correct person using two identifiers. Location patient: home Location provider: work Persons participating in the virtual visit: Richard Dupre Sr., Glenna Durand LPN.   I discussed the limitations, risks, security and privacy concerns of performing an evaluation and management service by telephone and the availability of in person appointments. I also discussed with the patient that there may be a patient responsible charge related to this service. The patient expressed understanding and verbally consented to this telephonic visit.    Interactive audio and video telecommunications were attempted between this provider and patient, however failed, due to patient having technical difficulties OR patient did not have access to video capability.  We continued and completed visit with audio only.     Vital signs may be patient reported or missing.  Subjective:   Richard Columbia Sr. is a 65 y.o. male who presents for an Initial Medicare Annual Wellness Visit.  Review of Systems     Cardiac Risk Factors include: advanced age (>3mn, >>31women);diabetes mellitus;dyslipidemia;hypertension;male gender     Objective:    Today's Vitals   02/28/21 0816  Weight: 173 lb (78.5 kg)  Height: 5' 11"  (1.803 m)   Body mass index is 24.13 kg/m.  Advanced Directives 02/28/2021  Does Patient Have a Medical Advance Directive? No    Current Medications (verified) Outpatient Encounter Medications as of 02/28/2021  Medication Sig   allopurinol (ZYLOPRIM) 300 MG tablet Take 1 tablet (300 mg total) by mouth daily.   aspirin 81 MG tablet Take 1 tablet by mouth daily at 6 (six) AM.   benazepril (LOTENSIN) 40 MG tablet TAKE ONE TABLET BY MOUTH DAILY AT 6 IN THE MORNING   Blood Glucose Calibration (OT ULTRA/FASTTK CNTRL SOLN) SOLN Use as instructed to check blood sugars twice a day.   Blood Glucose Monitoring Suppl  (ONE TOUCH ULTRA 2) w/Device KIT Use as instructed to check blood sugars twice a day.   famotidine (PEPCID) 20 MG tablet Take 20 mg by mouth in the morning.   gabapentin (NEURONTIN) 100 MG capsule TAKE ONE CAPSULE BY MOUTH THREE TIMES A DAY   ketoconazole (NIZORAL) 2 % shampoo Apply 1 application topically 2 (two) times a week. As needed for scalp dermatitis   Lancets Misc. (ONE TOUCH SURESOFT) MISC Use as instructed to check blood sugars twice a day.   Magnesium Gluconate 500 (27 MG) MG TABS Take 1 tablet by mouth every evening.    melatonin 5 MG TABS Take 1 tablet by mouth at bedtime.    metFORMIN (GLUCOPHAGE) 1000 MG tablet Take 1/2 tablet (500 mg total) by mouth 2 (two) times daily with meals   MULTIPLE VITAMIN PO Take by mouth.   ONETOUCH ULTRA test strip Use as instructed to check blood sugars twice a day.   OZEMPIC, 1 MG/DOSE, 4 MG/3ML SOPN Inject 1 mg into the skin once a week.   Potassium 99 MG TABS Take by mouth.   rosuvastatin (CRESTOR) 5 MG tablet Take 1 tablet (5 mg total) by mouth at bedtime.   saw palmetto 160 MG capsule Take 1 capsule (160 mg total) by mouth 2 (two) times daily.   sertraline (ZOLOFT) 50 MG tablet TAKE ONE TABLET BY MOUTH DAILY   sildenafil (REVATIO) 20 MG tablet Take 3-5 tablets daily by mouth as needed   tamsulosin (FLOMAX) 0.4 MG CAPS capsule Take 2 capsules (0.8 mg total) by mouth daily.  traZODone (DESYREL) 50 MG tablet TAKE ONE AND ONE-HALF TABLETS BY MOUTH AT BEDTIME   vitamin B-12 (CYANOCOBALAMIN) 1000 MCG tablet Take by mouth.   zinc gluconate 50 MG tablet Take 50 mg by mouth daily.   No facility-administered encounter medications on file as of 02/28/2021.    Allergies (verified) Bee venom   History: Past Medical History:  Diagnosis Date   Anxiety    Cholelithiasis    GERD (gastroesophageal reflux disease)    Hyperlipidemia    Past Surgical History:  Procedure Laterality Date   Lung Collapse  65 years old.    Family History  Problem  Relation Age of Onset   Diabetes Father        complications of DM caused death   Heart attack Father    Diabetes Sister    Cancer Mother        lung   Lung cancer Mother    Social History   Socioeconomic History   Marital status: Married    Spouse name: Not on file   Number of children: Not on file   Years of education: Not on file   Highest education level: Not on file  Occupational History   Not on file  Tobacco Use   Smoking status: Former    Years: 10.00    Types: Cigarettes    Quit date: 08/08/1999    Years since quitting: 21.5   Smokeless tobacco: Former  Scientific laboratory technician Use: Never used  Substance and Sexual Activity   Alcohol use: No    Alcohol/week: 0.0 standard drinks    Comment: Has requested Antabuse at previous PCP visit.    Drug use: No   Sexual activity: Yes  Other Topics Concern   Not on file  Social History Narrative   Not on file   Social Determinants of Health   Financial Resource Strain: Low Risk    Difficulty of Paying Living Expenses: Not hard at all  Food Insecurity: No Food Insecurity   Worried About Charity fundraiser in the Last Year: Never true   [place] in the Last Year: Never true  Transportation Needs: No Transportation Needs   Lack of Transportation (Medical): No   Lack of Transportation (Non-Medical): No  Physical Activity: Sufficiently Active   Days of Exercise per Week: 7 days   Minutes of Exercise per Session: 30 min  Stress: No Stress Concern Present   Feeling of Stress : Not at all  Social Connections: Not on file    Tobacco Counseling Counseling given: Not Answered   Clinical Intake:  Pre-visit preparation completed: Yes  Pain : No/denies pain     Nutritional Status: BMI of 19-24  Normal Nutritional Risks: None Diabetes: Yes  How often do you need to have someone help you when you read instructions, pamphlets, or other written materials from your doctor or pharmacy?: 1 - Never What is the last  grade level you completed in school?: some college  Diabetic? Yes Nutrition Risk Assessment:  Has the patient had any N/V/D within the last 2 months?  No  Does the patient have any non-healing wounds?  No  Has the patient had any unintentional weight loss or weight gain?  No   Diabetes:  Is the patient diabetic?  Yes  If diabetic, was a CBG obtained today?  No  Did the patient bring in their glucometer from home?  No  How often do you monitor your CBG's? Twice daily.  Financial Strains and Diabetes Management:  Are you having any financial strains with the device, your supplies or your medication? No .  Does the patient want to be seen by Chronic Care Management for management of their diabetes?  No  Would the patient like to be referred to a Nutritionist or for Diabetic Management?  No   Diabetic Exams:  Diabetic Eye Exam: Completed 01/2021 per patient Diabetic Foot Exam: Completed 06/15/2020   Interpreter Needed?: No  Information entered by :: NAllen LPN   Activities of Daily Living In your present state of health, do you have any difficulty performing the following activities: 02/28/2021 06/15/2020  Hearing? N N  Vision? N N  Difficulty concentrating or making decisions? Y N  Walking or climbing stairs? N N  Dressing or bathing? N N  Doing errands, shopping? N N  Preparing Food and eating ? N -  Using the Toilet? N -  In the past six months, have you accidently leaked urine? N -  Do you have problems with loss of bowel control? N -  Managing your Medications? N -  Managing your Finances? N -  Housekeeping or managing your Housekeeping? N -  Some recent data might be hidden    Patient Care Team: [name], DO as PCP - General (Family Medicine) Christene Lye, MD (General Surgery) Luciana Axe, NP as Nurse Practitioner (Family Medicine) Curley Spice, Virl Diamond, RPH-CPP as Pharmacist (Pharmacist)  Indicate any recent Medical Services you  may have received from other than [facility] providers in the past year (date may be approximate).     Assessment:   This is a routine wellness examination for Lido.  Hearing/Vision screen Vision Screening - Comments:: Regular eye exams, Dr. Gloriann Loan  Dietary issues and exercise activities discussed: Current Exercise Habits: Home exercise routine, Type of exercise: walking, Time (Minutes): 30, Frequency (Times/Week): 7, Weekly Exercise (Minutes/Week): 210   Goals Addressed             This Visit's Progress    Patient Stated       02/28/2021, maintain       Depression Screen PHQ 2/9 Scores 02/28/2021 06/15/2020 12/15/2019 06/10/2019 12/03/2018 06/02/2018 02/26/2018  PHQ - 2 Score 0 0 0 2 1 0 0  PHQ- 9 Score - 0 1 3 2  0 3    Fall Risk Fall Risk  02/28/2021 06/15/2020 12/15/2019 12/03/2018 06/02/2018  Falls in the past year? 0 0 0 0 0  Number falls in past yr: - 0 0 - -  Injury with Fall? - 0 0 - -  Risk for fall due to : Medication side effect - - - -  Follow up Falls evaluation completed;Education provided;Falls prevention discussed Falls evaluation completed Falls evaluation completed - Falls evaluation completed    FALL RISK PREVENTION PERTAINING TO THE HOME:  Any stairs in or around the home? Yes  If so, are there any without handrails? Yes  Home free of loose throw rugs in walkways, pet beds, electrical cords, etc? Yes  Adequate lighting in your home to reduce risk of falls? Yes   ASSISTIVE DEVICES UTILIZED TO PREVENT FALLS:  Life alert? No  Use of a cane, walker or w/c? No  Grab bars in the bathroom? Yes  Shower chair or bench in shower? No  Elevated toilet seat or a handicapped toilet? Yes   TIMED UP AND GO:  Was the test performed? No .       Cognitive Function:  6CIT Screen 02/28/2021  What Year? 0 points  What month? 0 points  What time? 0 points  Count back from 20 0 points  Months in reverse 4 points  Repeat phrase 0 points  Total Score 4     Immunizations Immunization History  Administered Date(s) Administered   Fluad Quad(high Dose 65+) 06/15/2020   Influenza,inj,Quad PF,6+ Mos 03/17/2014, 04/12/2016, 05/06/2017, 06/02/2018, 06/10/2019   Influenza-Unspecified 04/08/2014, 05/11/2015   PFIZER(Purple Top)SARS-COV-2 Vaccination 10/27/2019, 11/17/2019, 06/07/2020   Pneumococcal Conjugate-13 12/15/2019   Pneumococcal Polysaccharide-23 03/09/2014, 04/22/2014   Tdap 07/09/2008, 04/08/2014    TDAP status: Up to date  Flu Vaccine status: Due, Education has been provided regarding the importance of this vaccine. Advised may receive this vaccine at local pharmacy or Health Dept. Aware to provide a copy of the vaccination record if obtained from local pharmacy or Health Dept. Verbalized acceptance and understanding.  Pneumococcal vaccine status: Up to date  Covid-19 vaccine status: Completed vaccines  Qualifies for Shingles Vaccine? Yes   Zostavax completed No   Shingrix Completed?: No.    Education has been provided regarding the importance of this vaccine. Patient has been advised to call insurance company to determine out of pocket expense if they have not yet received this vaccine. Advised may also receive vaccine at local pharmacy or Health Dept. Verbalized acceptance and understanding.  Screening Tests Health Maintenance  Topic Date Due   Zoster Vaccines- Shingrix (1 of 2) Never done   OPHTHALMOLOGY EXAM  08/14/2019   COVID-19 Vaccine (4 - Booster for Pfizer series) 10/05/2020   PNA vac Low Risk Adult (2 of 2 - PPSV23) 12/14/2020   INFLUENZA VACCINE  02/06/2021   FOOT EXAM  06/15/2021   HEMOGLOBIN A1C  07/19/2021   Fecal DNA (Cologuard)  12/28/2022   TETANUS/TDAP  04/08/2024   Hepatitis C Screening  Completed   HPV VACCINES  Aged Out    Health Maintenance  Health Maintenance Due  Topic Date Due   Zoster Vaccines- Shingrix (1 of 2) Never done   OPHTHALMOLOGY EXAM  08/14/2019   COVID-19 Vaccine (4 - Booster  for Pfizer series) 10/05/2020   PNA vac Low Risk Adult (2 of 2 - PPSV23) 12/14/2020   INFLUENZA VACCINE  02/06/2021    Colorectal cancer screening: Type of screening: Cologuard. Completed 12/28/2019. Repeat every 3 years  Lung Cancer Screening: (Low Dose CT Chest recommended if Age 58-80 years, 30 pack-year currently smoking OR have quit w/in 15years.) does not qualify.   Lung Cancer Screening Referral: no  Additional Screening:  Hepatitis C Screening: does qualify; Completed 03/13/2016  Vision Screening: Recommended annual ophthalmology exams for early detection of glaucoma and other disorders of the eye. Is the patient up to date with their annual eye exam?  Yes  Who is the provider or what is the name of the office in which the patient attends annual eye exams? Dr. Gloriann Loan If pt is not established with a provider, would they like to be referred to a provider to establish care? No .   Dental Screening: Recommended annual dental exams for proper oral hygiene  Community Resource Referral / Chronic Care Management: CRR required this visit?  No   CCM required this visit?  No      Plan:     I have personally reviewed and noted the following in the patient's chart:   Medical and social history Use of alcohol, tobacco or illicit drugs  Current medications and supplements including opioid prescriptions. Patient is not currently  taking opioid prescriptions. Functional ability and status Nutritional status Physical activity Advanced directives List of other physicians Hospitalizations, surgeries, and ER visits in previous 12 months Vitals Screenings to include cognitive, depression, and falls Referrals and appointments  In addition, I have reviewed and discussed with patient certain preventive protocols, quality metrics, and best practice recommendations. A written personalized care plan for preventive services as well as general preventive health recommendations were provided to  patient.     Kellie Simmering, LPN   [id number]   Nurse Notes:

## 2021-02-28 NOTE — Patient Instructions (Signed)
Richard Day , Thank you for taking time to come for your Medicare Wellness Visit. I appreciate your ongoing commitment to your health goals. Please review the following plan we discussed and let me know if I can assist you in the future.   Screening recommendations/referrals: Colonoscopy: cologuard 12/28/2019, due 12/28/2022 Recommended yearly ophthalmology/optometry visit for glaucoma screening and checkup Recommended yearly dental visit for hygiene and checkup  Vaccinations: Influenza vaccine: due Pneumococcal vaccine: completed 12/15/2019 Tdap vaccine: completed 04/08/2014, due 04/08/2024 Shingles vaccine: discussed   Covid-19: 06/07/2020, 11/17/2019, 10/27/2019  Advanced directives: Advance directive discussed with you today.   Conditions/risks identified: none  Next appointment: Follow up in one year for your annual wellness visit.   Preventive Care 65 Years and Older, Male Preventive care refers to lifestyle choices and visits with your health care provider that can promote health and wellness. What does preventive care include? A yearly physical exam. This is also called an annual well check. Dental exams once or twice a year. Routine eye exams. Ask your health care provider how often you should have your eyes checked. Personal lifestyle choices, including: Daily care of your teeth and gums. Regular physical activity. Eating a healthy diet. Avoiding tobacco and drug use. Limiting alcohol use. Practicing safe sex. Taking low doses of aspirin every day. Taking vitamin and mineral supplements as recommended by your health care provider. What happens during an annual well check? The services and screenings done by your health care provider during your annual well check will depend on your age, overall health, lifestyle risk factors, and family history of disease. Counseling  Your health care provider may ask you questions about your: Alcohol use. Tobacco use. Drug use. Emotional  well-being. Home and relationship well-being. Sexual activity. Eating habits. History of falls. Memory and ability to understand (cognition). Work and work Statistician. Screening  You may have the following tests or measurements: Height, weight, and BMI. Blood pressure. Lipid and cholesterol levels. These may be checked every 5 years, or more frequently if you are over 29 years old. Skin check. Lung cancer screening. You may have this screening every year starting at age 38 if you have a 30-pack-year history of smoking and currently smoke or have quit within the past 15 years. Fecal occult blood test (FOBT) of the stool. You may have this test every year starting at age 51. Flexible sigmoidoscopy or colonoscopy. You may have a sigmoidoscopy every 5 years or a colonoscopy every 10 years starting at age 30. Prostate cancer screening. Recommendations will vary depending on your family history and other risks. Hepatitis C blood test. Hepatitis B blood test. Sexually transmitted disease (STD) testing. Diabetes screening. This is done by checking your blood sugar (glucose) after you have not eaten for a while (fasting). You may have this done every 1-3 years. Abdominal aortic aneurysm (AAA) screening. You may need this if you are a current or former smoker. Osteoporosis. You may be screened starting at age 22 if you are at high risk. Talk with your health care provider about your test results, treatment options, and if necessary, the need for more tests. Vaccines  Your health care provider may recommend certain vaccines, such as: Influenza vaccine. This is recommended every year. Tetanus, diphtheria, and acellular pertussis (Tdap, Td) vaccine. You may need a Td booster every 10 years. Zoster vaccine. You may need this after age 61. Pneumococcal 13-valent conjugate (PCV13) vaccine. One dose is recommended after age 8. Pneumococcal polysaccharide (PPSV23) vaccine. One dose is recommended  after  age 48. Talk to your health care provider about which screenings and vaccines you need and how often you need them. This information is not intended to replace advice given to you by your health care provider. Make sure you discuss any questions you have with your health care provider. Document Released: 07/22/2015 Document Revised: 03/14/2016 Document Reviewed: 04/26/2015 Elsevier Interactive Patient Education  2017 [employer] Prevention in the Home Falls can cause injuries. They can happen to people of all ages. There are many things you can do to make your home safe and to help prevent falls. What can I do on the outside of my home? Regularly fix the edges of walkways and driveways and fix any cracks. Remove anything that might make you trip as you walk through a door, such as a raised step or threshold. Trim any bushes or trees on the path to your home. Use bright outdoor lighting. Clear any walking paths of anything that might make someone trip, such as rocks or tools. Regularly check to see if handrails are loose or broken. Make sure that both sides of any steps have handrails. Any raised decks and porches should have guardrails on the edges. Have any leaves, snow, or ice cleared regularly. Use sand or salt on walking paths during winter. Clean up any spills in your garage right away. This includes oil or grease spills. What can I do in the bathroom? Use night lights. Install grab bars by the toilet and in the tub and shower. Do not use towel bars as grab bars. Use non-skid mats or decals in the tub or shower. If you need to sit down in the shower, use a plastic, non-slip stool. Keep the floor dry. Clean up any water that spills on the floor as soon as it happens. Remove soap buildup in the tub or shower regularly. Attach bath mats securely with double-sided non-slip rug tape. Do not have throw rugs and other things on the floor that can make you trip. What can I do in the  bedroom? Use night lights. Make sure that you have a light by your bed that is easy to reach. Do not use any sheets or blankets that are too big for your bed. They should not hang down onto the floor. Have a firm chair that has side arms. You can use this for support while you get dressed. Do not have throw rugs and other things on the floor that can make you trip. What can I do in the kitchen? Clean up any spills right away. Avoid walking on wet floors. Keep items that you use a lot in easy-to-reach places. If you need to reach something above you, use a strong step stool that has a grab bar. Keep electrical cords out of the way. Do not use floor polish or wax that makes floors slippery. If you must use wax, use non-skid floor wax. Do not have throw rugs and other things on the floor that can make you trip. What can I do with my stairs? Do not leave any items on the stairs. Make sure that there are handrails on both sides of the stairs and use them. Fix handrails that are broken or loose. Make sure that handrails are as long as the stairways. Check any carpeting to make sure that it is firmly attached to the stairs. Fix any carpet that is loose or worn. Avoid having throw rugs at the top or bottom of the stairs. If you  do have throw rugs, attach them to the floor with carpet tape. Make sure that you have a light switch at the top of the stairs and the bottom of the stairs. If you do not have them, ask someone to add them for you. What else can I do to help prevent falls? Wear shoes that: Do not have high heels. Have rubber bottoms. Are comfortable and fit you well. Are closed at the toe. Do not wear sandals. If you use a stepladder: Make sure that it is fully opened. Do not climb a closed stepladder. Make sure that both sides of the stepladder are locked into place. Ask someone to hold it for you, if possible. Clearly mark and make sure that you can see: Any grab bars or  handrails. First and last steps. Where the edge of each step is. Use tools that help you move around (mobility aids) if they are needed. These include: Canes. Walkers. Scooters. Crutches. Turn on the lights when you go into a dark area. Replace any light bulbs as soon as they burn out. Set up your furniture so you have a clear path. Avoid moving your furniture around. If any of your floors are uneven, fix them. If there are any pets around you, be aware of where they are. Review your medicines with your doctor. Some medicines can make you feel dizzy. This can increase your chance of falling. Ask your doctor what other things that you can do to help prevent falls. This information is not intended to replace advice given to you by your health care provider. Make sure you discuss any questions you have with your health care provider. Document Released: 04/21/2009 Document Revised: 12/01/2015 Document Reviewed: 07/30/2014 Elsevier Interactive Patient Education  2017 [employer].

## 2021-03-14 ENCOUNTER — Encounter: Payer: Self-pay | Admitting: Family Medicine

## 2021-03-14 ENCOUNTER — Other Ambulatory Visit: Payer: Self-pay

## 2021-03-14 DIAGNOSIS — L219 Seborrheic dermatitis, unspecified: Secondary | ICD-10-CM

## 2021-03-14 MED ORDER — KETOCONAZOLE 2 % EX SHAM: 1.0000 "application " | MEDICATED_SHAMPOO | CUTANEOUS | 0 refills | Status: AC

## 2021-04-01 ENCOUNTER — Other Ambulatory Visit: Payer: Self-pay | Admitting: Family Medicine

## 2021-04-01 DIAGNOSIS — E1149 Type 2 diabetes mellitus with other diabetic neurological complication: Secondary | ICD-10-CM

## 2021-04-02 NOTE — Telephone Encounter (Signed)
Requested by interface [email or web address] too soon

## 2021-04-04 ENCOUNTER — Other Ambulatory Visit: Payer: Self-pay | Admitting: Family Medicine

## 2021-04-04 DIAGNOSIS — E1149 Type 2 diabetes mellitus with other diabetic neurological complication: Secondary | ICD-10-CM

## 2021-06-08 ENCOUNTER — Other Ambulatory Visit: Payer: Medicare HMO

## 2021-06-08 ENCOUNTER — Other Ambulatory Visit: Payer: Self-pay

## 2021-06-08 ENCOUNTER — Other Ambulatory Visit: Payer: Self-pay | Admitting: Internal Medicine

## 2021-06-08 DIAGNOSIS — E1149 Type 2 diabetes mellitus with other diabetic neurological complication: Secondary | ICD-10-CM | POA: Diagnosis not present

## 2021-06-08 DIAGNOSIS — E1169 Type 2 diabetes mellitus with other specified complication: Secondary | ICD-10-CM | POA: Diagnosis not present

## 2021-06-08 DIAGNOSIS — Z0000 Encounter for general adult medical examination without abnormal findings: Secondary | ICD-10-CM | POA: Diagnosis not present

## 2021-06-08 DIAGNOSIS — R69 Illness, unspecified: Secondary | ICD-10-CM | POA: Diagnosis not present

## 2021-06-08 DIAGNOSIS — E785 Hyperlipidemia, unspecified: Secondary | ICD-10-CM | POA: Diagnosis not present

## 2021-06-08 DIAGNOSIS — F339 Major depressive disorder, recurrent, unspecified: Secondary | ICD-10-CM | POA: Diagnosis not present

## 2021-06-08 DIAGNOSIS — I10 Essential (primary) hypertension: Secondary | ICD-10-CM | POA: Diagnosis not present

## 2021-06-08 DIAGNOSIS — N138 Other obstructive and reflux uropathy: Secondary | ICD-10-CM | POA: Diagnosis not present

## 2021-06-08 DIAGNOSIS — N401 Enlarged prostate with lower urinary tract symptoms: Secondary | ICD-10-CM | POA: Diagnosis not present

## 2021-06-08 DIAGNOSIS — E663 Overweight: Secondary | ICD-10-CM | POA: Diagnosis not present

## 2021-06-09 LAB — COMPLETE METABOLIC PANEL WITH GFR
AG Ratio: 1.3 (calc) (ref 1.0–2.5)
ALT: 30 U/L (ref 9–46)
AST: 43 U/L — ABNORMAL HIGH (ref 10–35)
Albumin: 4 g/dL (ref 3.6–5.1)
Alkaline phosphatase (APISO): 148 U/L — ABNORMAL HIGH (ref 35–144)
BUN: 12 mg/dL (ref 7–25)
CO2: 30 mmol/L (ref 20–32)
Calcium: 9.4 mg/dL (ref 8.6–10.3)
Chloride: 103 mmol/L (ref 98–110)
Creat: 1.01 mg/dL (ref 0.70–1.35)
Globulin: 3.1 g/dL (calc) (ref 1.9–3.7)
Glucose, Bld: 135 mg/dL — ABNORMAL HIGH (ref 65–99)
Potassium: 5.5 mmol/L — ABNORMAL HIGH (ref 3.5–5.3)
Sodium: 139 mmol/L (ref 135–146)
Total Bilirubin: 0.5 mg/dL (ref 0.2–1.2)
Total Protein: 7.1 g/dL (ref 6.1–8.1)
eGFR: 82 mL/min/{1.73_m2} (ref >=60)

## 2021-06-09 LAB — CBC WITH DIFFERENTIAL/PLATELET
Absolute Monocytes: 462 cells/uL (ref 200–950)
Basophils Absolute: 50 cells/uL (ref 0–200)
Basophils Relative: 1.8 %
Eosinophils Absolute: 148 cells/uL (ref 15–500)
Eosinophils Relative: 5.3 %
HCT: 39.5 % (ref 38.5–50.0)
Hemoglobin: 13.5 g/dL (ref 13.2–17.1)
Lymphs Abs: 641 cells/uL — ABNORMAL LOW (ref 850–3900)
MCH: 33.8 pg — ABNORMAL HIGH (ref 27.0–33.0)
MCHC: 34.2 g/dL (ref 32.0–36.0)
MCV: 98.8 fL (ref 80.0–100.0)
MPV: 10.5 fL (ref 7.5–12.5)
Monocytes Relative: 16.5 %
Neutro Abs: 1498 cells/uL — ABNORMAL LOW (ref 1500–7800)
Neutrophils Relative %: 53.5 %
Platelets: 124 10*3/uL — ABNORMAL LOW (ref 140–400)
RBC: 4 10*6/uL — ABNORMAL LOW (ref 4.20–5.80)
RDW: 14.4 % (ref 11.0–15.0)
Total Lymphocyte: 22.9 %
WBC: 2.8 10*3/uL — ABNORMAL LOW (ref 3.8–10.8)

## 2021-06-09 LAB — LIPID PANEL
Cholesterol: 150 mg/dL (ref <200)
HDL: 66 mg/dL (ref >=40)
LDL Cholesterol (Calc): 71 mg/dL (calc)
Non-HDL Cholesterol (Calc): 84 mg/dL (calc) (ref <130)
Total CHOL/HDL Ratio: 2.3 (calc) (ref <5.0)
Triglycerides: 54 mg/dL (ref <150)

## 2021-06-09 LAB — PSA: PSA: 0.57 ng/mL (ref <=4.00)

## 2021-06-09 LAB — HEMOGLOBIN A1C
Hgb A1c MFr Bld: 5.6 % of total Hgb (ref <5.7)
Mean Plasma Glucose: 114 mg/dL
eAG (mmol/L): 6.3 mmol/L

## 2021-06-09 LAB — TSH: TSH: 3.82 mIU/L (ref 0.40–4.50)

## 2021-06-12 ENCOUNTER — Ambulatory Visit (INDEPENDENT_AMBULATORY_CARE_PROVIDER_SITE_OTHER): Payer: Medicare HMO | Admitting: Pharmacist

## 2021-06-12 DIAGNOSIS — E1169 Type 2 diabetes mellitus with other specified complication: Secondary | ICD-10-CM

## 2021-06-12 DIAGNOSIS — E1149 Type 2 diabetes mellitus with other diabetic neurological complication: Secondary | ICD-10-CM

## 2021-06-12 NOTE — Chronic Care Management (AMB) (Signed)
Chronic Care Management CCM Pharmacy Note  06/12/2021 Name:  Richard SALLY Sr. MRN:  [id number] DOB:  September 18, 1955   Subjective: Richard Render Hanken Sr. is an 65 y.o. year old male who is a primary patient of [name], DO.  The CCM team was consulted for assistance with disease management and care coordination needs.    Engaged with patient by telephone for follow up visit for pharmacy case management and/or care coordination services.   Objective:  Medications Reviewed Today     Reviewed by Kellie Simmering, LPN (Licensed Practical Nurse) on 02/28/21 at [phone]  Med List Status: <None>   Medication Order Taking? Sig Documenting Provider Last Dose Status Informant  allopurinol (ZYLOPRIM) 300 MG tablet [id number] Yes Take 1 tablet (300 mg total) by mouth daily. [name], MD Taking Active   aspirin 81 MG tablet [id number] Yes Take 1 tablet by mouth daily at 6 (six) AM. [name], [name], MD Taking Active            Med Note [facility], [name]   Sat Nov 06, 2014 11:01 AM) Received from: [place]:   benazepril (LOTENSIN) 40 MG tablet [id number] Yes TAKE ONE TABLET BY MOUTH DAILY AT 6 IN THE MORNING [name], [name], DO Taking Active   Blood Glucose Calibration (OT ULTRA/FASTTK CNTRL SOLN) SOLN [id number] Yes Use as instructed to check blood sugars twice a day. [name], DO Taking Active   Blood Glucose Monitoring Suppl (ONE TOUCH ULTRA 2) w/Device KIT [id number] Yes Use as instructed to check blood sugars twice a day. [name], DO Taking Active   famotidine (PEPCID) 20 MG tablet [id number] Yes Take 20 mg by mouth in the morning. [provider] Taking Active   gabapentin (NEURONTIN) 100 MG capsule [id number] Yes TAKE ONE CAPSULE BY MOUTH THREE TIMES A DAY [name], [name], DO Taking Active   ketoconazole (NIZORAL) 2 % shampoo [id number] Yes Apply 1 application topically 2 (two) times a week. As  needed for scalp dermatitis Parks Ranger, [name], DO Taking Active   Lancets Misc. ([place]) [place] [id number] Yes Use as instructed to check blood sugars twice a day. [name], DO Taking Active   Magnesium Gluconate 500 (27 MG) MG TABS [id number] Yes Take 1 tablet by mouth every evening.  [name], MD Taking Active            Med Note [facility], [name]   Sat Nov 06, 2014 11:01 AM) Received from: [employer]  melatonin 5 MG TABS [id number] Yes Take 1 tablet by mouth at bedtime.  [provider] Taking Active   metFORMIN (GLUCOPHAGE) 1000 MG tablet [id number] Yes Take 1/2 tablet (500 mg total) by mouth 2 (two) times daily with meals [name], DO Taking Active   MULTIPLE VITAMIN PO [id number] Yes Take by mouth. [name], MD Taking Active            Med Note [facility], [name]   Sat Nov 06, 2014 11:01 AM) Received from: [place] test strip [id number] Yes Use as instructed to check blood sugars twice a day. [name], DO Taking Active   OZEMPIC, 1 MG/DOSE, 4 MG/3ML SOPN [id number] Yes Inject 1 mg into the skin once a week. [name], DO Taking Active   Potassium 99 MG TABS [id number] Yes Take by mouth. [provider] Taking Active  rosuvastatin (CRESTOR) 5 MG tablet [id number] Yes Take 1 tablet (5 mg total) by mouth at bedtime. [name], DO Taking Active   saw palmetto 160 MG capsule [id number] Yes Take 1 capsule (160 mg total) by mouth 2 (two) times daily. [name], DO Taking Active   sertraline (ZOLOFT) 50 MG tablet [id number] Yes TAKE ONE TABLET BY MOUTH DAILY Parks Ranger, [name], DO Taking Active   sildenafil (REVATIO) 20 MG tablet [id number] Yes Take 3-5 tablets daily by mouth as needed [name], [name], MD Taking Active   tamsulosin (FLOMAX) 0.4 MG CAPS capsule [id number] Yes Take 2 capsules (0.8 mg total)  by mouth daily. [name], MD Taking Active   traZODone (DESYREL) 50 MG tablet [id number] Yes TAKE ONE AND ONE-HALF TABLETS BY MOUTH AT BEDTIME [name], DO Taking Active   vitamin B-12 (CYANOCOBALAMIN) 1000 MCG tablet [id number] Yes Take by mouth. [provider] Taking Active   zinc gluconate 50 MG tablet [id number] Yes Take 50 mg by mouth daily. [provider] Taking Active             Pertinent Labs:  Lab Results  Component Value Date   HGBA1C 5.6 06/08/2021   Lab Results  Component Value Date   CHOL 150 06/08/2021   HDL 66 06/08/2021   LDLCALC 71 06/08/2021   TRIG 54 06/08/2021   CHOLHDL 2.3 06/08/2021   Lab Results  Component Value Date   CREATININE 1.01 06/08/2021   BUN 12 06/08/2021   NA 139 06/08/2021   K 5.5 (H) 06/08/2021   CL 103 06/08/2021   CO2 30 06/08/2021    SDOH:  (Social Determinants of Health) assessments and interventions performed:    CCM Care Plan  Review of patient past medical history, allergies, medications, health status, including review of consultants reports, laboratory and other test data, was performed as part of comprehensive evaluation and provision of chronic care management services.   Care Plan : PharmD - Medication Assistance  Updates made by Rennis Petty, RPH-CPP since 06/12/2021 12:00 AM     Problem: Disease Progression      Long-Range Goal: Disease Progression Prevented or Minimized   Start Date: 08/17/2020  Expected End Date: 10/16/2020  This Visit's Progress: On track  Recent Progress: On track  Priority: High  Note:   Current Barriers:  Financial Barriers in complicated patient with multiple medical conditions including HTN, T2DM, HLD, BPH and depression; patient has [employer] and reports copay for Ozempic is cost prohibitive at this time Patient APPROVED for patient assistance for [employer] from [employer] for 2022 calendar year  Pharmacist  Clinical Goal(s):  Over the next 90 days, patient will maintain control of blood sugar as evidenced by A1C <7%  through collaboration with PharmD and provider.   Interventions: 1:1 collaboration with [name], DO regarding development and update of comprehensive plan of care as evidenced by provider attestation and co-signature Inter-disciplinary care team collaboration (see longitudinal plan of care) From review of lab results in chart, note patient's potassium level elevated to 5.5 mmol/L on 12/1 Collaborated with PCP. Office contacted patient to advise patient to stop potassium supplement  Type 2 Diabetes Controlled; current treatment: Metformin 1000 mg- 1/2 tablet (500 mg) twice daily Ozempic 1 mg weekly Reports blood sugar readings ranging: 90s-110 Denies symptoms of hypoglycemia  Hyperlipidemia: Controlled; rosuvastatin 5 mg daily  Medication Assistance: Reports currently has 6 Ozempic pens remaining. Will collaborate with [facility]  CPhT Sharee Pimple [place] for aid to patient with re-enrollment in [place] patient assistance program from [employer] for 2023 calendar year   Patient Goals/Self-Care Activities Over the next 90 days, patient will:  - take medications as prescribed - check glucose, document, and provide at future appointments - check blood pressure, document, and provide at future appointments - collaborate with provider on medication access solutions  Follow Up Plan:  Telephone follow up appointment with care management team member scheduled for: 07/26/2021 at 10:15 AM        Wallace Cullens, PharmD, [place], [place] [phone]

## 2021-06-13 ENCOUNTER — Telehealth: Payer: Self-pay | Admitting: Pharmacy Technician

## 2021-06-13 ENCOUNTER — Encounter: Payer: Self-pay | Admitting: Family Medicine

## 2021-06-13 DIAGNOSIS — Z596 Low income: Secondary | ICD-10-CM

## 2021-06-13 NOTE — Progress Notes (Signed)
[facility])                                            [place] Team    06/13/2021  Richard Mcclain Bova Sr. 11-23-1955 [id number]  FOR 2023 RE ENROLLMENT                                      Medication Assistance Referral  Referral From: [facility] Embedded RPh Richard Day   Medication/Company: Richard Day / Novo Nordisk Patient application portion:  Education officer, museum portion: Faxed  to Dr. Parks Ranger Provider address/fax verified via: Office website    [name] P. Resa Rinks, [address]  [phone]

## 2021-06-14 ENCOUNTER — Other Ambulatory Visit: Payer: Self-pay | Admitting: Family Medicine

## 2021-06-14 ENCOUNTER — Ambulatory Visit (INDEPENDENT_AMBULATORY_CARE_PROVIDER_SITE_OTHER): Payer: Medicare HMO | Admitting: Family Medicine

## 2021-06-14 ENCOUNTER — Encounter: Payer: Self-pay | Admitting: Family Medicine

## 2021-06-14 ENCOUNTER — Other Ambulatory Visit: Payer: Self-pay

## 2021-06-14 VITALS — BP 110/55 | HR 73 | Ht 71.0 in | Wt 171.0 lb

## 2021-06-14 DIAGNOSIS — E1169 Type 2 diabetes mellitus with other specified complication: Secondary | ICD-10-CM

## 2021-06-14 DIAGNOSIS — E785 Hyperlipidemia, unspecified: Secondary | ICD-10-CM

## 2021-06-14 DIAGNOSIS — N401 Enlarged prostate with lower urinary tract symptoms: Secondary | ICD-10-CM

## 2021-06-14 DIAGNOSIS — I10 Essential (primary) hypertension: Secondary | ICD-10-CM | POA: Diagnosis not present

## 2021-06-14 DIAGNOSIS — Z0000 Encounter for general adult medical examination without abnormal findings: Secondary | ICD-10-CM | POA: Diagnosis not present

## 2021-06-14 DIAGNOSIS — N138 Other obstructive and reflux uropathy: Secondary | ICD-10-CM | POA: Diagnosis not present

## 2021-06-14 DIAGNOSIS — F339 Major depressive disorder, recurrent, unspecified: Secondary | ICD-10-CM | POA: Diagnosis not present

## 2021-06-14 DIAGNOSIS — E1149 Type 2 diabetes mellitus with other diabetic neurological complication: Secondary | ICD-10-CM

## 2021-06-14 DIAGNOSIS — E875 Hyperkalemia: Secondary | ICD-10-CM

## 2021-06-14 DIAGNOSIS — D61818 Other pancytopenia: Secondary | ICD-10-CM

## 2021-06-14 DIAGNOSIS — F1021 Alcohol dependence, in remission: Secondary | ICD-10-CM

## 2021-06-14 DIAGNOSIS — R69 Illness, unspecified: Secondary | ICD-10-CM | POA: Diagnosis not present

## 2021-06-14 NOTE — Assessment & Plan Note (Addendum)
Controlled HTN - some lower readings, will review in future if persistent low, may lower med No known complications     Plan:  1. Continue current BP regimen Benazepril 40mg daily  2. Encourage improved lifestyle - low sodium diet, regular exercise 3. Continue monitor BP outside office, bring readings to next visit, if persistently >140/90 or new symptoms notify office sooner 

## 2021-06-14 NOTE — Assessment & Plan Note (Signed)
Controlled DM A1c 5.6 on med and lifestyle Complications - peripheral neuropathy, other including hyperlipidemia, GERD, depression - increases risk of future cardiovascular complications poor glucose control due to reduced lifestyle diet/exercise with low energy mood and fatigue - Failed Victoza. OFF Glimepiride and NPH Insulin  Plan:  1. CONTINUE dose for Ozepmic up to 1mg  weekly injection (PAP program) 2. Keep on metformin 500mg  BID (half of 1000mg ) 2. Encourage improved lifestyle - low carb, low sugar diet, reduce portion size, continue improving regular exercise 3. Check CBG, bring log to next visit for review - For DM Neuropathy - keep on Gabapentin - adjust dose - 300mg  QHS and maybe 100mg  AM instead of 100 TID - sinc waking up with neuropathy cramping symptoms - we can refer to Neurology next if need 4. Continue ASA, ACEi

## 2021-06-14 NOTE — Assessment & Plan Note (Signed)
Stable, without any recurrence of alcohol intake

## 2021-06-14 NOTE — Assessment & Plan Note (Signed)
Dramatic improved LDL 124 down to 71 on low dose statin. Normal LFTs The 10-year ASCVD risk score (Arnett DK, et al., 2019) is: 16.3% Prior statin failed Simvastatin (stopped due to LFTs, however chart review shows unlikely d/t statin since still elevated >1 year off statin, likely from prior alcohol history)  Plan: 1.Continue Rosuvastatin 5mg  daily - reconsider if causing muscle cramps in future 2. Continue ASA 81mg  for primary ASCVD risk reduction 3. Encourage improved lifestyle - low carb/cholesterol, reduce portion size, continue improving regular exercise

## 2021-06-14 NOTE — Patient Instructions (Addendum)
Recent Labs    01/16/21 0831 06/08/21 0955  HGBA1C 5.4 5.6   Adjust Gabapentin from 100mg  TID change to higher dose for PM for morning leg cramps, take 300mg  nightly may take 1x dose 100mg  in Am as well. We can adjust further. I believe the cramps may be from nerve damage neuropathy  May need neurologist in future  HOLD Potassium supplement for now  Check labs again in 3 months.  You do also need a Pneumovax-23 vaccine - we can get this next time.  Please schedule a Follow-up Appointment to: Return in about 3 months (around 09/12/2021) for 3 month Non fasting lab only then 1 week later Follow-up Lab results, cramping, PNA Vaccine.  If you have any other questions or concerns, please feel free to call the office or send a message through [place]. You may also schedule an earlier appointment if necessary.  Additionally, you may be receiving a survey about your experience at our office within a few days to 1 week by e-mail or mail. We value your feedback.  Nobie Putnam, DO [place]

## 2021-06-14 NOTE — Patient Instructions (Addendum)
Visit Information  Thank you for taking time to visit with me today. Please don't hesitate to contact me if I can be of assistance to you before our next scheduled telephone appointment.  Following are the goals we discussed today:  Our goal A1c is less than 7%. This corresponds with fasting sugars less than 130 and 2 hour after meal sugars less than 180. Please continue keep a log of results when you check your blood sugar at home.  Our goal bad cholesterol, or LDL, is less than 70 . This is why it is important to continue taking your rosuvastatin  Feel free to call me with any questions or concerns. I look forward to our next call!  Wallace Cullens, PharmD, [place] [phone]  Our next appointment is by telephone on 07/26/2021 at 10:15 AM  Please call the care guide team at [phone] if you need to cancel or reschedule your appointment.    The patient verbalized understanding of instructions, educational materials, and care plan provided today and declined offer to receive copy of patient instructions, educational materials, and care plan.

## 2021-06-14 NOTE — Assessment & Plan Note (Signed)
Controlled mood on SSRI and Trazodone On SSRI Sertraline 50mg  Continue Trazodone 75mg  nightly Follow-up as planned

## 2021-06-14 NOTE — Progress Notes (Signed)
Subjective:    Patient ID: Richard Columbia Sr., male    DOB: [birth year], 65 y.o.   MRN: [id number]  Richard FIUMARA Sr. is a 65 y.o. male presenting on 06/14/2021 for Annual Exam   HPI  Here for Annual Physical   CHRONIC DM, Type 2 with DM Neuropathy / Overweight BMI >23 Hypertension - Last lab A1c 5.6 improved - Today reports he continues to do very well at this time CBGs: Checks CBGs 1-3x daily - avg 120-140, rarely low Meds: - Ozempic 23m weekly (PAP program) - Metformin 5032mBID (half of 100010m- Failed Victoza in past, OFF Glimepiride, OFF NPH Insulin Reports good compliance. Tolerating well w/o side-effects Currently on ACEi Lifestyle: - Weight down 3-5 lbs in 1 year - Diet (improved diet overall still - reduced portions) - Exercise (increased activity and exercise) Chronic issue with known, Bilateral feet, mild loss of sensitivity Taking Vitamin B12 Magnesium, MVI, Melatonin. Admits tingling and neuropathy in feet bilateral. Takes OTC medicine for neuropathy. Still has cramping pain in both calves - worse in AM when wakes up otherwise not bad. On gabapentin 100m62mD limited relief - Last DM Eye exam by Dr BellGloriann Loanies hypoglycemia, polyuria, visual changes or tingling.   Major Depression, recurrent in remission / Insomnia Alcohol Dependence in remission Reports his mood has been good overall without new concerns. Continues on SSRI Sertraline 50mg29mly with great results Continues on Trazodone 1.5 pills for 75mg 70mtly for insomnia.   HYPERLIPIDEMIA: - Reports no concerns. Last lipid panel 06/2021 major improved LDL to 71 from previous 124 pre statin On Rosuvastatin 5mg ni100mly not causing side effects   CHRONIC HTN: Reports no concerns, some lower BP lately Current Meds - Benazepril 40mg da79m  Reports good compliance, took meds today. Tolerating well, w/o complaints. Denies CP, dyspnea, HA, edema, dizziness / lightheadedness      Health Maintenance:    UTD Flu Vaccine and COVID Booster omicron 06/08/21   Last colonoscopy approx 2008. He has declined in interval due to ins coverage and other concerns. - Cologuard done 12/28/19 - negative, good for 3 years, next 12/2022   Will be due for Pneumovax-23 to complete PNA vaccine series in future.  Depression screen PHQ 2/9 [facility]7/2022 02/28/2021 06/15/2020  Decreased Interest 0 0 0  Down, Depressed, Hopeless 0 0 0  PHQ - 2 Score 0 0 0  Altered sleeping 0 - 0  Tired, decreased energy 0 - 0  Change in appetite 0 - 0  Feeling bad or failure about yourself  0 - 0  Trouble concentrating 0 - 0  Moving slowly or fidgety/restless 0 - 0  Suicidal thoughts 0 - 0  PHQ-9 Score 0 - 0  Difficult doing work/chores Not difficult at all - Not difficult at all  Some recent data might be hidden    Past Medical History:  Diagnosis Date   Anxiety    Cholelithiasis    GERD (gastroesophageal reflux disease)    Hyperlipidemia    Past Surgical History:  Procedure Laterality Date   Lung Collapse  25 years33ld.    Social History   Socioeconomic History   Marital status: Married    Spouse name: Not on file   Number of children: Not on file   Years of education: Not on file   Highest education level: Not on file  Occupational History   Not on file  Tobacco Use   Smoking status: Former    Years:  10.00    Types: Cigarettes    Quit date: 08/08/1999    Years since quitting: 21.8   Smokeless tobacco: Former  Scientific laboratory technician Use: Never used  Substance and Sexual Activity   Alcohol use: No    Alcohol/week: 0.0 standard drinks    Comment: Has requested Antabuse at previous PCP visit.    Drug use: No   Sexual activity: Yes  Other Topics Concern   Not on file  Social History Narrative   Not on file   Social Determinants of Health   Financial Resource Strain: Low Risk    Difficulty of Paying Living Expenses: Not hard at all  Food Insecurity: No Food Insecurity   Worried About Sales executive in the Last Year: Never true   [place] in the Last Year: Never true  Transportation Needs: No Transportation Needs   Lack of Transportation (Medical): No   Lack of Transportation (Non-Medical): No  Physical Activity: Sufficiently Active   Days of Exercise per Week: 7 days   Minutes of Exercise per Session: 30 min  Stress: No Stress Concern Present   Feeling of Stress : Not at all  Social Connections: Not on file  Intimate Partner Violence: Not on file   Family History  Problem Relation Age of Onset   Diabetes Father        complications of DM caused death   Heart attack Father    Diabetes Sister    Cancer Mother        lung   Lung cancer Mother    Current Outpatient Medications on File Prior to Visit  Medication Sig   allopurinol (ZYLOPRIM) 300 MG tablet Take 1 tablet (300 mg total) by mouth daily.   aspirin 81 MG tablet Take 1 tablet by mouth daily at 6 (six) AM.   benazepril (LOTENSIN) 40 MG tablet TAKE ONE TABLET BY MOUTH DAILY AT 6 IN THE MORNING   Blood Glucose Calibration (OT ULTRA/FASTTK CNTRL SOLN) SOLN Use as instructed to check blood sugars twice a day.   Blood Glucose Monitoring Suppl (ONE TOUCH ULTRA 2) w/Device KIT Use as instructed to check blood sugars twice a day.   famotidine (PEPCID) 20 MG tablet Take 20 mg by mouth in the morning.   gabapentin (NEURONTIN) 100 MG capsule TAKE ONE CAPSULE BY MOUTH THREE TIMES A DAY   ketoconazole (NIZORAL) 2 % shampoo Apply 1 application topically 2 (two) times a week. As needed for scalp dermatitis   Lancets Misc. (ONE TOUCH SURESOFT) MISC Use as instructed to check blood sugars twice a day.   Magnesium Gluconate 500 (27 MG) MG TABS Take 1 tablet by mouth every evening.    melatonin 5 MG TABS Take 1 tablet by mouth at bedtime.    metFORMIN (GLUCOPHAGE) 1000 MG tablet Take 1/2 tablet (500 mg total) by mouth 2 (two) times daily with meals   MULTIPLE VITAMIN PO Take by mouth.   ONETOUCH ULTRA test strip Use as  instructed to check blood sugars twice a day.   OZEMPIC, 1 MG/DOSE, 4 MG/3ML SOPN Inject 1 mg into the skin once a week.   Potassium 99 MG TABS Take by mouth.   rosuvastatin (CRESTOR) 5 MG tablet Take 1 tablet (5 mg total) by mouth at bedtime.   saw palmetto 160 MG capsule Take 1 capsule (160 mg total) by mouth 2 (two) times daily.   sertraline (ZOLOFT) 50 MG tablet TAKE ONE TABLET BY MOUTH  DAILY   sildenafil (REVATIO) 20 MG tablet Take 3-5 tablets daily by mouth as needed   tamsulosin (FLOMAX) 0.4 MG CAPS capsule Take 2 capsules (0.8 mg total) by mouth daily.   traZODone (DESYREL) 50 MG tablet TAKE ONE AND ONE-HALF TABLETS BY MOUTH AT BEDTIME   vitamin B-12 (CYANOCOBALAMIN) 1000 MCG tablet Take by mouth.   zinc gluconate 50 MG tablet Take 50 mg by mouth daily.   No current facility-administered medications on file prior to visit.    Review of Systems  Constitutional:  Negative for activity change, appetite change, chills, diaphoresis, fatigue and fever.  HENT:  Negative for congestion and hearing loss.   Eyes:  Negative for visual disturbance.  Respiratory:  Negative for cough, chest tightness, shortness of breath and wheezing.   Cardiovascular:  Negative for chest pain, palpitations and leg swelling.  Gastrointestinal:  Negative for abdominal pain, constipation, diarrhea, nausea and vomiting.  Genitourinary:  Negative for dysuria, frequency and hematuria.  Musculoskeletal:  Negative for arthralgias and neck pain.  Skin:  Negative for rash.  Neurological:  Negative for dizziness, weakness, light-headedness, numbness and headaches.  Hematological:  Negative for adenopathy.  Psychiatric/Behavioral:  Negative for behavioral problems, dysphoric mood and sleep disturbance.   Per HPI unless specifically indicated above     Objective:    BP (!) 110/55   Pulse 73   Ht _0  (1.803 m)   Wt 171 lb (77.6 kg)   SpO2 100%   BMI 23.85 kg/m   Wt Readings from Last 3 Encounters:  06/14/21  171 lb (77.6 kg)  02/28/21 173 lb (78.5 kg)  09/15/20 174 lb (78.9 kg)    Physical Exam Vitals and nursing note reviewed.  Constitutional:      General: He is not in acute distress.    Appearance: He is well-developed. He is not diaphoretic.     Comments: Well-appearing, comfortable, cooperative  HENT:     Head: Normocephalic and atraumatic.  Eyes:     General:        Right eye: No discharge.        Left eye: No discharge.     Conjunctiva/sclera: Conjunctivae normal.     Pupils: Pupils are equal, round, and reactive to light.  Neck:     Thyroid: No thyromegaly.  Cardiovascular:     Rate and Rhythm: Normal rate and regular rhythm.     Pulses: Normal pulses.     Heart sounds: Normal heart sounds. No murmur heard. Pulmonary:     Effort: Pulmonary effort is normal. No respiratory distress.     Breath sounds: Normal breath sounds. No wheezing or rales.  Abdominal:     General: Bowel sounds are normal. There is no distension.     Palpations: Abdomen is soft. There is no mass.     Tenderness: There is no abdominal tenderness.  Musculoskeletal:        General: No tenderness. Normal range of motion.     Cervical back: Normal range of motion and neck supple.     Comments: Upper / Lower Extremities: - Normal muscle tone, strength bilateral upper extremities 5/5, lower extremities 5/5  Lymphadenopathy:     Cervical: No cervical adenopathy.  Skin:    General: Skin is warm and dry.     Findings: No erythema or rash.  Neurological:     Mental Status: He is alert and oriented to person, place, and time.     Comments: Distal sensation intact to light touch  all extremities  Psychiatric:        Mood and Affect: Mood normal.        Behavior: Behavior normal.        Thought Content: Thought content normal.     Comments: Well groomed, good eye contact, normal speech and thoughts     Results for orders placed or performed in visit on 06/08/21  CBC with Differential/Platelet  Result  Value Ref Range   WBC 2.8 (L) 3.8 - 10.8 Thousand/uL   RBC 4.00 (L) 4.20 - 5.80 Million/uL   Hemoglobin 13.5 13.2 - 17.1 g/dL   HCT 39.5 38.5 - 50.0 %   MCV 98.8 80.0 - 100.0 fL   MCH 33.8 (H) 27.0 - 33.0 pg   MCHC 34.2 32.0 - 36.0 g/dL   RDW 14.4 11.0 - 15.0 %   Platelets 124 (L) 140 - 400 Thousand/uL   MPV 10.5 7.5 - 12.5 fL   Neutro Abs 1,498 (L) 1,500 - 7,800 cells/uL   Lymphs Abs 641 (L) 850 - 3,900 cells/uL   Absolute Monocytes 462 200 - 950 cells/uL   Eosinophils Absolute 148 15 - 500 cells/uL   Basophils Absolute 50 0 - 200 cells/uL   Neutrophils Relative % 53.5 %   Total Lymphocyte 22.9 %   Monocytes Relative 16.5 %   Eosinophils Relative 5.3 %   Basophils Relative 1.8 %  COMPLETE METABOLIC PANEL WITH GFR  Result Value Ref Range   Glucose, Bld 135 (H) 65 - 99 mg/dL   BUN 12 7 - 25 mg/dL   Creat 1.01 0.70 - 1.35 mg/dL   eGFR 82 > OR = 60 mL/min/1.41m   BUN/Creatinine Ratio NOT APPLICABLE 6 - 22 (calc)   Sodium 139 135 - 146 mmol/L   Potassium 5.5 (H) 3.5 - 5.3 mmol/L   Chloride 103 98 - 110 mmol/L   CO2 30 20 - 32 mmol/L   Calcium 9.4 8.6 - 10.3 mg/dL   Total Protein 7.1 6.1 - 8.1 g/dL   Albumin 4.0 3.6 - 5.1 g/dL   Globulin 3.1 1.9 - 3.7 g/dL (calc)   AG Ratio 1.3 1.0 - 2.5 (calc)   Total Bilirubin 0.5 0.2 - 1.2 mg/dL   Alkaline phosphatase (APISO) 148 (H) 35 - 144 U/L   AST 43 (H) 10 - 35 U/L   ALT 30 9 - 46 U/L  TSH  Result Value Ref Range   TSH 3.82 0.40 - 4.50 mIU/L  Lipid panel  Result Value Ref Range   Cholesterol 150 <200 mg/dL   HDL 66 > OR = 40 mg/dL   Triglycerides 54 <150 mg/dL   LDL Cholesterol (Calc) 71 mg/dL (calc)   Total CHOL/HDL Ratio 2.3 <5.0 (calc)   Non-HDL Cholesterol (Calc) 84 <130 mg/dL (calc)  Hemoglobin A1c  Result Value Ref Range   Hgb A1c MFr Bld 5.6 <5.7 % of total Hgb   Mean Plasma Glucose 114 mg/dL   eAG (mmol/L) 6.3 mmol/L  PSA  Result Value Ref Range   PSA 0.57 < OR = 4.00 ng/mL      Assessment & Plan:   Problem  List Items Addressed This Visit     Major depression, recurrent, chronic (HCC)    Controlled mood on SSRI and Trazodone On SSRI Sertraline 517mContinue Trazodone 7524mightly Follow-up as planned      Hyperlipidemia associated with type 2 diabetes mellitus (HCC)    Dramatic improved LDL 124 down to 71 on low dose statin. Normal LFTs  The 10-year ASCVD risk score (Arnett DK, et al., 2019) is: 16.3% Prior statin failed Simvastatin (stopped due to LFTs, however chart review shows unlikely d/t statin since still elevated >1 year off statin, likely from prior alcohol history)  Plan: 1.Continue Rosuvastatin 54m daily - reconsider if causing muscle cramps in future 2. Continue ASA 862mfor primary ASCVD risk reduction 3. Encourage improved lifestyle - low carb/cholesterol, reduce portion size, continue improving regular exercise      Essential hypertension    Controlled HTN - some lower readings, will review in future if persistent low, may lower med No known complications     Plan:  1. Continue current BP regimen Benazepril 4048maily  2. Encourage improved lifestyle - low sodium diet, regular exercise 3. Continue monitor BP outside office, bring readings to next visit, if persistently >140/90 or new symptoms notify office sooner      DM (diabetes mellitus), type 2 with neurological complications (HCCVance  Controlled DM A1c 5.6 on med and lifestyle Complications - peripheral neuropathy, other including hyperlipidemia, GERD, depression - increases risk of future cardiovascular complications poor glucose control due to reduced lifestyle diet/exercise with low energy mood and fatigue - Failed Victoza. OFF Glimepiride and NPH Insulin  Plan:  1. CONTINUE dose for Ozepmic up to 1mg12mekly injection (PAP program) 2. Keep on metformin 500mg16m (half of 1000mg)47mEncourage improved lifestyle - low carb, low sugar diet, reduce portion size, continue improving regular exercise 3. Check CBG,  bring log to next visit for review - For DM Neuropathy - keep on Gabapentin - adjust dose - 300mg Q44mnd maybe 100mg AM44mtead of 100 TID - sinc waking up with neuropathy cramping symptoms - we can refer to Neurology next if need 4. Continue ASA, ACEi      Benign prostatic hyperplasia with urinary obstruction   Alcohol dependence in remission (HCC)    [place], without any recurrence of alcohol intake      Other Visit Diagnoses     Annual physical exam    -  Primary   Hyperkalemia           Updated Health Maintenance information Updated vaccines COVID Flu Will need Pneumovax-23 at next opportunity now 1 yr after prior Prevnar13 PSA negative Future Colon CA Screen 2024 Reviewed recent lab results with patient Encouraged improvement to lifestyle with diet and exercise Goal of weight loss  HyperK STOP OTC K Supplement daily On ACEI already Will repeat Lab BMET 3 months  Also low cell lines WBC / Hgb / PLT - mild reduction, will repeat in 3 months.  No orders of the defined types were placed in this encounter.     Follow up plan: Return in about 3 months (around 09/12/2021) for 3 month Non fasting lab only then 1 week later Follow-up Lab results, cramping, PNA Vaccine.  Future labs CBC / path smear review + BMET 3 mo  Richard Putnamth GrSt. Stephen2/01/2021, 9:15 AM

## 2021-06-15 ENCOUNTER — Ambulatory Visit: Payer: Medicare HMO | Admitting: Urology

## 2021-06-15 ENCOUNTER — Encounter: Payer: Self-pay | Admitting: Urology

## 2021-06-15 VITALS — BP 137/79 | HR 79 | Ht 71.0 in | Wt 171.0 lb

## 2021-06-15 DIAGNOSIS — N401 Enlarged prostate with lower urinary tract symptoms: Secondary | ICD-10-CM | POA: Diagnosis not present

## 2021-06-15 DIAGNOSIS — R350 Frequency of micturition: Secondary | ICD-10-CM | POA: Diagnosis not present

## 2021-06-15 DIAGNOSIS — N5201 Erectile dysfunction due to arterial insufficiency: Secondary | ICD-10-CM | POA: Diagnosis not present

## 2021-06-15 LAB — URINALYSIS, COMPLETE
Bilirubin, UA: NEGATIVE
Glucose, UA: NEGATIVE
Ketones, UA: NEGATIVE
Leukocytes,UA: NEGATIVE
Nitrite, UA: NEGATIVE
Protein,UA: NEGATIVE
RBC, UA: NEGATIVE
Specific Gravity, UA: 1.015 (ref 1.005–1.030)
Urobilinogen, Ur: 2 mg/dL — ABNORMAL HIGH (ref 0.2–1.0)
pH, UA: 6 (ref 5.0–7.5)

## 2021-06-15 MED ORDER — SILDENAFIL CITRATE 20 MG PO TABS: ORAL_TABLET | ORAL | 1 refills | Status: DC

## 2021-06-15 MED ORDER — TAMSULOSIN HCL 0.4 MG PO CAPS: 0.8000 mg | ORAL_CAPSULE | Freq: Every day | ORAL | 3 refills | Status: DC

## 2021-06-15 NOTE — Progress Notes (Signed)
 06/15/2021 9:21 AM   Richard C Mays Sr. 01/18/1956 [id number]  Referring provider: [name], Alexander J, DO [address] [place],  [place] [address]  Chief Complaint  Patient presents with   Benign Prostatic Hypertrophy    Urologic history:   1.  History elevated PSA -Prostate biopsy 2007 Dr. Cope; PSA not listed in record review -Path ASAP -Rebiopsy negative   2.  BPH with lower urinary tract symptoms -Tamsulosin   3.  History urinary calculi -History uric acid stones -On allopurinol   4.  Erectile dysfunction -On generic sildenafil   HPI: 65 y.o. male presents for annual follow-up.  Stable voiding symptoms but does have frequency, urgency, weak stream on tamsulosin 0.8 mg daily Not interested in an outlet procedure including minimally invasive options Sildenafil remains effective for his ED No flank, abdominal or pelvic pain PSA 06/08/2021 stable at 0.57 IPSS today34/35   PMH: Past Medical History:  Diagnosis Date   Anxiety    Cholelithiasis    GERD (gastroesophageal reflux disease)    Hyperlipidemia     Surgical History: Past Surgical History:  Procedure Laterality Date   Lung Collapse  65 years old.     Home Medications:  Allergies as of 06/15/2021       Reactions   Bee Venom Swelling   Itching         Medication List        Accurate as of June 15, 2021  9:21 AM. If you have any questions, ask your nurse or doctor.          allopurinol 300 MG tablet Commonly known as: ZYLOPRIM Take 1 tablet (300 mg total) by mouth daily.   aspirin 81 MG tablet Take 1 tablet by mouth daily at 6 (six) AM.   benazepril 40 MG tablet Commonly known as: LOTENSIN TAKE ONE TABLET BY MOUTH DAILY AT 6 IN THE MORNING   famotidine 20 MG tablet Commonly known as: PEPCID Take 20 mg by mouth in the morning.   gabapentin 100 MG capsule Commonly known as: NEURONTIN TAKE ONE CAPSULE BY MOUTH THREE TIMES A DAY   ketoconazole 2 % shampoo Commonly  known as: NIZORAL Apply 1 application topically 2 (two) times a week. As needed for scalp dermatitis   Magnesium Gluconate 500 (27 Mg) MG Tabs Take 1 tablet by mouth every evening.   melatonin 5 MG Tabs Take 1 tablet by mouth at bedtime.   metFORMIN 1000 MG tablet Commonly known as: GLUCOPHAGE Take 1/2 tablet (500 mg total) by mouth 2 (two) times daily with meals   MULTIPLE VITAMIN PO Take by mouth.   ONE TOUCH SURESOFT Misc Use as instructed to check blood sugars twice a day.   ONE TOUCH ULTRA 2 w/Device Kit Use as instructed to check blood sugars twice a day.   OneTouch Ultra test strip Generic drug: glucose blood Use as instructed to check blood sugars twice a day.   OT ULTRA/FASTTK CNTRL SOLN Soln Use as instructed to check blood sugars twice a day.   Ozempic (1 MG/DOSE) 4 MG/3ML Sopn Generic drug: Semaglutide (1 MG/DOSE) Inject 1 mg into the skin once a week.   rosuvastatin 5 MG tablet Commonly known as: CRESTOR Take 1 tablet (5 mg total) by mouth at bedtime.   saw palmetto 160 MG capsule Take 1 capsule (160 mg total) by mouth 2 (two) times daily.   sertraline 50 MG tablet Commonly known as: ZOLOFT TAKE ONE TABLET BY MOUTH DAILY     sildenafil 20 MG tablet Commonly known as: REVATIO Take 3-5 tablets daily by mouth as needed   tamsulosin 0.4 MG Caps capsule Commonly known as: FLOMAX Take 2 capsules (0.8 mg total) by mouth daily.   traZODone 50 MG tablet Commonly known as: DESYREL TAKE ONE AND ONE-HALF TABLETS BY MOUTH AT BEDTIME   vitamin B-12 1000 MCG tablet Commonly known as: CYANOCOBALAMIN Take by mouth.   zinc gluconate 50 MG tablet Take 50 mg by mouth daily.        Allergies:  Allergies  Allergen Reactions   Bee Venom Swelling    Itching     Family History: Family History  Problem Relation Age of Onset   Diabetes Father        complications of DM caused death   Heart attack Father    Diabetes Sister    Cancer Mother        lung    Lung cancer Mother     Social History:  reports that he quit smoking about 21 years ago. His smoking use included cigarettes. He has quit using smokeless tobacco. He reports that he does not drink alcohol and does not use drugs.   Physical Exam: BP 137/79   Pulse 79   Ht 5' 11" (1.803 m)   Wt 171 lb (77.6 kg)   BMI 23.85 kg/m   Constitutional:  Alert and oriented, No acute distress. HEENT: [place] AT, moist mucus membranes.  Trachea midline, no masses. Cardiovascular: No clubbing, cyanosis, or edema. Respiratory: Normal respiratory effort, no increased work of breathing. GU: Prostate 40 g, smooth without nodules Psychiatric: Normal mood and affect.  Laboratory Data:  Urinalysis Dipstick/microscopy negative  Assessment & Plan:    1. Benign prostatic hyperplasia with urinary frequency Stable voiding symptoms but severe by IPSS Bladder scan PVR 1 mL Not interested in outlet procedure/minimally invasive options Additional medical management was discussed including addition of finasteride or daily tadalafil Continue annual follow-up and return as needed for worsening voiding symptoms Tamsulosin refilled  2.  History elevated PSA Low and stable PSA Benign DRE  3.  Erectile dysfunction Stable Sildenafil refilled  Scott C [name], MD  [facility] [address], [address] [place], [place] [address] [phone]   

## 2021-06-20 ENCOUNTER — Other Ambulatory Visit: Payer: Self-pay | Admitting: Family Medicine

## 2021-06-20 DIAGNOSIS — E1169 Type 2 diabetes mellitus with other specified complication: Secondary | ICD-10-CM

## 2021-06-20 NOTE — Telephone Encounter (Signed)
Requested Prescriptions  Pending Prescriptions Disp Refills   rosuvastatin (CRESTOR) 5 MG tablet [Pharmacy Med Name: ROSUVASTATIN CALCIUM 5 MG TAB] 90 tablet 3    Sig: TAKE ONE TABLET BY MOUTH EVERY NIGHT AT BEDTIME     Cardiovascular:  Antilipid - Statins Passed - 06/20/2021  3:14 PM      Passed - Total Cholesterol in normal range and within 360 days    Cholesterol, Total  Date Value Ref Range Status  01/20/2015 226 (H) 100 - 199 mg/dL Final   Cholesterol  Date Value Ref Range Status  06/08/2021 150 <200 mg/dL Final         Passed - LDL in normal range and within 360 days    LDL Cholesterol (Calc)  Date Value Ref Range Status  06/08/2021 71 mg/dL (calc) Final    Comment:    Reference range: <100 . Desirable range <100 mg/dL for primary prevention;   <70 mg/dL for patients with CHD or diabetic patients  with > or = 2 CHD risk factors. Marland Kitchen LDL-C is now calculated using the Martin-Hopkins  calculation, which is a validated novel method providing  better accuracy than the Friedewald equation in the  estimation of LDL-C.  Cresenciano Genre et al. Annamaria Helling. 7106;269(48): 2061-2068  (http://education.[facility].com/faq/FAQ164)          Passed - HDL in normal range and within 360 days    HDL  Date Value Ref Range Status  06/08/2021 66 > OR = 40 mg/dL Final  01/20/2015 44 >39 mg/dL Final    Comment:    According to ATP-III Guidelines, HDL-C >59 mg/dL is considered a negative risk factor for CHD.          Passed - Triglycerides in normal range and within 360 days    Triglycerides  Date Value Ref Range Status  06/08/2021 54 <150 mg/dL Final         Passed - Patient is not pregnant      Passed - Valid encounter within last 12 months    Recent Outpatient Visits          6 days ago Annual physical exam   [place], DO   9 months ago DM (diabetes mellitus), type 2 with neurological complications [facility])   [place], DO   1 year ago Annual physical exam   [facility] [name], DO   1 year ago DM (diabetes mellitus), type 2 with neurological complications [facility])   [facility] [name], DO   2 years ago Annual physical exam   [place], DO      Future Appointments            In 3 months [place], [name], [facility], [place]   In 8 months  [facility], [place]   In 11 months [place], [name], [place]

## 2021-07-05 ENCOUNTER — Other Ambulatory Visit: Payer: Self-pay | Admitting: Family Medicine

## 2021-07-05 DIAGNOSIS — I10 Essential (primary) hypertension: Secondary | ICD-10-CM

## 2021-07-05 NOTE — Telephone Encounter (Signed)
Requested Prescriptions  Pending Prescriptions Disp Refills   benazepril (LOTENSIN) 40 MG tablet [Pharmacy Med Name: BENAZEPRIL HCL 40 MG TABLET] 90 tablet 1    Sig: TAKE 1 TABLET BY MOUTH DAILY AT 6 IN THE MORNING.     Cardiovascular:  ACE Inhibitors Failed - 07/05/2021 10:50 AM      Failed - K in normal range and within 180 days    Potassium  Date Value Ref Range Status  06/08/2021 5.5 (H) 3.5 - 5.3 mmol/L Final         Passed - Cr in normal range and within 180 days    Creat  Date Value Ref Range Status  06/08/2021 1.01 0.70 - 1.35 mg/dL Final         Passed - Patient is not pregnant      Passed - Last BP in normal range    BP Readings from Last 1 Encounters:  06/15/21 137/79         Passed - Valid encounter within last 6 months    Recent Outpatient Visits          3 weeks ago Annual physical exam   [place], DO   9 months ago DM (diabetes mellitus), type 2 with neurological complications [facility])   [place], DO   1 year ago Annual physical exam   [facility] [name], DO   1 year ago DM (diabetes mellitus), type 2 with neurological complications [facility])   [facility] [name], DO   2 years ago Annual physical exam   [place], DO      Future Appointments            In 2 months [place], [name], [facility], [place]   In 8 months  [facility], [place]   In 11 months [place], [name], [place]

## 2021-07-08 DIAGNOSIS — E1169 Type 2 diabetes mellitus with other specified complication: Secondary | ICD-10-CM

## 2021-07-08 DIAGNOSIS — E785 Hyperlipidemia, unspecified: Secondary | ICD-10-CM

## 2021-07-08 DIAGNOSIS — E1149 Type 2 diabetes mellitus with other diabetic neurological complication: Secondary | ICD-10-CM

## 2021-07-17 DIAGNOSIS — E113393 Type 2 diabetes mellitus with moderate nonproliferative diabetic retinopathy without macular edema, bilateral: Secondary | ICD-10-CM | POA: Diagnosis not present

## 2021-07-26 ENCOUNTER — Ambulatory Visit (INDEPENDENT_AMBULATORY_CARE_PROVIDER_SITE_OTHER): Payer: Medicare HMO | Admitting: Pharmacist

## 2021-07-26 DIAGNOSIS — E785 Hyperlipidemia, unspecified: Secondary | ICD-10-CM

## 2021-07-26 DIAGNOSIS — E1169 Type 2 diabetes mellitus with other specified complication: Secondary | ICD-10-CM

## 2021-07-26 DIAGNOSIS — E1149 Type 2 diabetes mellitus with other diabetic neurological complication: Secondary | ICD-10-CM

## 2021-07-26 NOTE — Chronic Care Management (AMB) (Signed)
Chronic Care Management CCM Pharmacy Note  07/26/2021 Name:  Richard GREENBLATT Sr. MRN:  [id number] DOB:  [birth year]   Subjective: Richard Render Altmann Sr. is an 66 y.o. year old male who is a primary patient of [name], DO.  The CCM team was consulted for assistance with disease management and care coordination needs.    Engaged with patient by telephone for follow up visit for pharmacy case management and/or care coordination services.   Objective:  Medications Reviewed Today     Reviewed by Rennis Petty, RPH-CPP (Pharmacist) on 07/26/21 at 1047  Med List Status: <None>   Medication Order Taking? Sig Documenting Provider Last Dose Status Informant  allopurinol (ZYLOPRIM) 300 MG tablet [id number]  Take 1 tablet (300 mg total) by mouth daily. [name], MD  Active   aspirin 81 MG tablet [id number]  Take 1 tablet by mouth daily at 6 (six) AM. [name], [name], MD  Active            Med Note [facility], [name]   Sat Nov 06, 2014 11:01 AM) Received from: [place]:   benazepril (LOTENSIN) 40 MG tablet [id number]  TAKE 1 TABLET BY MOUTH DAILY AT 6 IN THE MORNING. [name], DO  Active   Blood Glucose Calibration (OT ULTRA/FASTTK CNTRL SOLN) SOLN [id number]  Use as instructed to check blood sugars twice a day. [name], [name], DO  Active   Blood Glucose Monitoring Suppl (ONE TOUCH ULTRA 2) w/Device KIT [id number]  Use as instructed to check blood sugars twice a day. [name], DO  Active   famotidine (PEPCID) 20 MG tablet [id number]  Take 20 mg by mouth in the morning. [provider]  Active   gabapentin (NEURONTIN) 100 MG capsule [id number]  TAKE ONE CAPSULE BY MOUTH THREE TIMES A DAY [name], Alexander Lenna Sciara, DO  Active   ketoconazole (NIZORAL) 2 % shampoo [id number]  Apply 1 application topically 2 (two) times a week. As needed for scalp dermatitis [name], [name], DO  Active    Lancets Misc. ([place]) MISC [id number]  Use as instructed to check blood sugars twice a day. [name], DO  Active   Magnesium Gluconate 500 (27 MG) MG TABS [id number]  Take 1 tablet by mouth every evening.  [name], MD  Active            Med Note [facility], [name]   Sat Nov 06, 2014 11:01 AM) Received from: [employer]  melatonin 5 MG TABS [id number]  Take 1 tablet by mouth at bedtime.  [provider]  Active   metFORMIN (GLUCOPHAGE) 1000 MG tablet [id number] Yes Take 1/2 tablet (500 mg total) by mouth 2 (two) times daily with meals [name], DO Taking Active   MULTIPLE VITAMIN PO [id number]  Take by mouth. [name], MD  Active            Med Note [facility], [name]   Sat Nov 06, 2014 11:01 AM) Received from: [place] test strip [id number]  Use as instructed to check blood sugars twice a day. [name], DO  Active   OZEMPIC, 1 MG/DOSE, 4 MG/3ML Bonney Aid [id number] Yes Inject 1 mg into the skin once a week. [name], DO Taking Active   rosuvastatin (CRESTOR) 5 MG tablet [id number] Yes TAKE ONE TABLET BY MOUTH EVERY NIGHT AT BEDTIME  [name], DO Taking Active   saw palmetto 160 MG capsule [id number]  Take 1 capsule (160 mg total) by mouth 2 (two) times daily. [name], DO  Active   sertraline (ZOLOFT) 50 MG tablet [id number]  TAKE ONE TABLET BY MOUTH DAILY [name], DO  Active   sildenafil (REVATIO) 20 MG tablet [id number]  Take 3-5 tablets daily by mouth as needed [name], [name], MD  Active   tamsulosin (FLOMAX) 0.4 MG CAPS capsule [id number]  Take 2 capsules (0.8 mg total) by mouth daily. [name], MD  Active   traZODone (DESYREL) 50 MG tablet [id number]  TAKE ONE AND ONE-HALF TABLETS BY MOUTH AT BEDTIME [name], DO  Active   vitamin B-12 (CYANOCOBALAMIN) 1000 MCG tablet [id number]   Take by mouth. [provider]  Active   zinc gluconate 50 MG tablet [id number]  Take 50 mg by mouth daily. [provider]  Active             Pertinent Labs:  Lab Results  Component Value Date   HGBA1C 5.6 06/08/2021   Lab Results  Component Value Date   CHOL 150 06/08/2021   HDL 66 06/08/2021   LDLCALC 71 06/08/2021   TRIG 54 06/08/2021   CHOLHDL 2.3 06/08/2021   Lab Results  Component Value Date   CREATININE 1.01 06/08/2021   BUN 12 06/08/2021   NA 139 06/08/2021   K 5.5 (H) 06/08/2021   CL 103 06/08/2021   CO2 30 06/08/2021   BP Readings from Last 3 Encounters:  06/15/21 137/79  06/14/21 (!) 110/55  06/15/20 (!) 151/82   Pulse Readings from Last 3 Encounters:  06/15/21 79  06/14/21 73  06/15/20 78    SDOH:  (Social Determinants of Health) assessments and interventions performed:    [place]  Review of patient past medical history, allergies, medications, health status, including review of consultants reports, laboratory and other test data, was performed as part of comprehensive evaluation and provision of chronic care management services.   Care Plan : PharmD - Medication Assistance  Updates made by Rennis Petty, RPH-CPP since 07/26/2021 12:00 AM     Problem: Disease Progression      Long-Range Goal: Disease Progression Prevented or Minimized   Start Date: 08/17/2020  Expected End Date: 10/16/2020  This Visit's Progress: On track  Recent Progress: On track  Priority: High  Note:   Current Barriers:  Financial Barriers in complicated patient with multiple medical conditions including HTN, T2DM, HLD, BPH and depression; patient has [employer] and reports copay for Ozempic is cost prohibitive at this time Patient APPROVED for patient assistance for [employer] from [employer] for 2022 calendar year  Pharmacist Clinical Goal(s):  Over the next 90 days, patient will maintain control of blood  sugar as evidenced by A1C <7%  through collaboration with PharmD and provider.   Interventions: 1:1 collaboration with [name], DO regarding development and update of comprehensive plan of care as evidenced by provider attestation and co-signature Inter-disciplinary care team collaboration (see longitudinal plan of care) Perform chart review.  Patient seen for Office Visit with PCP on 12/7 for annual physical exam. Provider advised patient: Adjust Gabapentin from 119m TID change to higher dose for PM for morning leg cramps, take 3073mnightly may take 1x dose 10021mn Am as well.  HOLD Potassium supplement for now Check labs again in 3 months. Office Visit with  [place] on 12/8 Today patient confirms continues to hold potassium supplement and plans to come in for follow up labs as scheduled on 3/8  Medication Assistance: Collaborating with [place] Simcox for aid to patient with re-enrollment in [place] patient assistance program from [employer] for 2023 calendar year From review of notes from [place], patient called yesterday to advise he mailed his portion of re-enrollment application back to her at the end of last week Reports currently has 5 Ozempic pens remaining.  Type 2 Diabetes Controlled; current treatment: Metformin 1000 mg- 1/2 tablet (500 mg) twice daily Ozempic 1 mg weekly Reports blood sugar readings ranging: 110-115  Attributes higher readings (compared to previous results) to having more sugary snacks around house during and since the holidays  Hyperlipidemia: Controlled; rosuvastatin 5 mg daily   Patient Goals/Self-Care Activities Over the next 90 days, patient will:  - take medications as prescribed - check glucose, document, and provide at future appointments - check blood pressure, document, and provide at future appointments - collaborate with provider on medication access solutions  Follow Up Plan:  Telephone  follow up appointment with care management team member scheduled for: 08/28/2021 at 11:15 AM        Wallace Cullens, PharmD, [place], [facility] [place] [phone]

## 2021-07-26 NOTE — Patient Instructions (Addendum)
Visit Information  Thank you for taking time to visit with me today. Please don't hesitate to contact me if I can be of assistance to you before our next scheduled telephone appointment.  Following are the goals we discussed today:   Goals Addressed             This Visit's Progress    Pharmacy Goals       Our goal A1c is less than 7%. This corresponds with fasting sugars less than 130 and 2 hour after meal sugars less than 180. Please continue keep a log of results when you check your blood sugar at home.  Our goal bad cholesterol, or LDL, is less than 70 . This is why it is important to continue taking your rosuvastatin  Feel free to call me with any questions or concerns. I look forward to our next call!   Wallace Cullens, PharmD, [place] [phone]         Our next appointment is by telephone on 08/28/2021 at 11:15 AM  Please call the care guide team at [phone] if you need to cancel or reschedule your appointment.    Patient verbalizes understanding of instructions and care plan provided today and agrees to view in [place]. Active MyChart status confirmed with patient.

## 2021-08-08 ENCOUNTER — Telehealth: Payer: Self-pay | Admitting: Pharmacy Technician

## 2021-08-08 DIAGNOSIS — Z87891 Personal history of nicotine dependence: Secondary | ICD-10-CM

## 2021-08-08 DIAGNOSIS — E1149 Type 2 diabetes mellitus with other diabetic neurological complication: Secondary | ICD-10-CM | POA: Diagnosis not present

## 2021-08-08 DIAGNOSIS — E1169 Type 2 diabetes mellitus with other specified complication: Secondary | ICD-10-CM | POA: Diagnosis not present

## 2021-08-08 DIAGNOSIS — Z7985 Long-term (current) use of injectable non-insulin antidiabetic drugs: Secondary | ICD-10-CM

## 2021-08-08 DIAGNOSIS — Z7984 Long term (current) use of oral hypoglycemic drugs: Secondary | ICD-10-CM

## 2021-08-08 DIAGNOSIS — E785 Hyperlipidemia, unspecified: Secondary | ICD-10-CM

## 2021-08-08 DIAGNOSIS — Z596 Low income: Secondary | ICD-10-CM

## 2021-08-08 NOTE — Progress Notes (Signed)
[facility])                                            [place] Team    08/08/2021  Masato Pettie Balazs Sr. 07/08/1956 [id number]  Received both patient and provider portion(s) of patient assistance application(s) for Ozempic. Faxed completed application and required documents into [employer].    Joi Leyva P. Chimene Salo, [place]  [phone]

## 2021-08-23 ENCOUNTER — Telehealth: Payer: Self-pay | Admitting: Pharmacy Technician

## 2021-08-23 DIAGNOSIS — Z596 Low income: Secondary | ICD-10-CM

## 2021-08-23 NOTE — Progress Notes (Signed)
[facility])                                            [place] Team    08/23/2021  Richard Porreca Abundis Sr. 1956/03/24 [id number]  Care coordination call placed to [name] in regard to [place] application.   Spoke to [name] who informs patient is APPROVED 08/22/21-07/08/22. She informs it can take up to 42 days for the medication to be delivered to the provider's office.  [name], [place]  [phone]

## 2021-08-28 ENCOUNTER — Ambulatory Visit (INDEPENDENT_AMBULATORY_CARE_PROVIDER_SITE_OTHER): Payer: Medicare HMO | Admitting: Pharmacist

## 2021-08-28 DIAGNOSIS — E1169 Type 2 diabetes mellitus with other specified complication: Secondary | ICD-10-CM

## 2021-08-28 DIAGNOSIS — E1149 Type 2 diabetes mellitus with other diabetic neurological complication: Secondary | ICD-10-CM

## 2021-08-28 DIAGNOSIS — I10 Essential (primary) hypertension: Secondary | ICD-10-CM

## 2021-08-28 NOTE — Chronic Care Management (AMB) (Signed)
Chronic Care Management CCM Pharmacy Note  08/28/2021 Name:  Richard WOOLSTENHULME Sr. MRN:  [id number] DOB:  12-Dec-1955   Subjective: Richard Render Froemming Sr. is an 66 y.o. year old male who is a primary patient of [name], DO.  The CCM team was consulted for assistance with disease management and care coordination needs.    Engaged with patient by telephone for follow up visit for pharmacy case management and/or care coordination services.   Objective:  Medications Reviewed Today     Reviewed by Rennis Petty, RPH-CPP (Pharmacist) on 07/26/21 at 1047  Med List Status: <None>   Medication Order Taking? Sig Documenting Provider Last Dose Status Informant  allopurinol (ZYLOPRIM) 300 MG tablet [id number]  Take 1 tablet (300 mg total) by mouth daily. [name], MD  Active   aspirin 81 MG tablet [id number]  Take 1 tablet by mouth daily at 6 (six) AM. [name], [name], MD  Active            Med Note [facility], [name]   Sat Nov 06, 2014 11:01 AM) Received from: [place]:   benazepril (LOTENSIN) 40 MG tablet [id number]  TAKE 1 TABLET BY MOUTH DAILY AT 6 IN THE MORNING. [name], DO  Active   Blood Glucose Calibration (OT ULTRA/FASTTK CNTRL SOLN) SOLN [id number]  Use as instructed to check blood sugars twice a day. [name], [name], DO  Active   Blood Glucose Monitoring Suppl (ONE TOUCH ULTRA 2) w/Device KIT [id number]  Use as instructed to check blood sugars twice a day. [name], DO  Active   famotidine (PEPCID) 20 MG tablet [id number]  Take 20 mg by mouth in the morning. [provider]  Active   gabapentin (NEURONTIN) 100 MG capsule [id number]  TAKE ONE CAPSULE BY MOUTH THREE TIMES A DAY [name], Alexander Lenna Sciara, DO  Active   ketoconazole (NIZORAL) 2 % shampoo [id number]  Apply 1 application topically 2 (two) times a week. As needed for scalp dermatitis [name], [name], DO  Active    Lancets Misc. ([place]) MISC [id number]  Use as instructed to check blood sugars twice a day. [name], DO  Active   Magnesium Gluconate 500 (27 MG) MG TABS [id number]  Take 1 tablet by mouth every evening.  [name], MD  Active            Med Note [facility], [name]   Sat Nov 06, 2014 11:01 AM) Received from: [employer]  melatonin 5 MG TABS [id number]  Take 1 tablet by mouth at bedtime.  [provider]  Active   metFORMIN (GLUCOPHAGE) 1000 MG tablet [id number] Yes Take 1/2 tablet (500 mg total) by mouth 2 (two) times daily with meals [name], DO Taking Active   MULTIPLE VITAMIN PO [id number]  Take by mouth. [name], MD  Active            Med Note [facility], [name]   Sat Nov 06, 2014 11:01 AM) Received from: [place] test strip [id number]  Use as instructed to check blood sugars twice a day. [name], DO  Active   OZEMPIC, 1 MG/DOSE, 4 MG/3ML Bonney Aid [id number] Yes Inject 1 mg into the skin once a week. [name], DO Taking Active   rosuvastatin (CRESTOR) 5 MG tablet [id number] Yes TAKE ONE TABLET BY MOUTH EVERY NIGHT AT BEDTIME  [name], DO Taking Active   saw palmetto 160 MG capsule [id number]  Take 1 capsule (160 mg total) by mouth 2 (two) times daily. [name], DO  Active   sertraline (ZOLOFT) 50 MG tablet [id number]  TAKE ONE TABLET BY MOUTH DAILY [name], DO  Active   sildenafil (REVATIO) 20 MG tablet [id number]  Take 3-5 tablets daily by mouth as needed [name], [name], MD  Active   tamsulosin (FLOMAX) 0.4 MG CAPS capsule [id number]  Take 2 capsules (0.8 mg total) by mouth daily. [name], MD  Active   traZODone (DESYREL) 50 MG tablet [id number]  TAKE ONE AND ONE-HALF TABLETS BY MOUTH AT BEDTIME [name], DO  Active   vitamin B-12 (CYANOCOBALAMIN) 1000 MCG tablet [id number]   Take by mouth. [provider]  Active   zinc gluconate 50 MG tablet [id number]  Take 50 mg by mouth daily. [provider]  Active             Pertinent Labs:  Lab Results  Component Value Date   HGBA1C 5.6 06/08/2021   Lab Results  Component Value Date   CHOL 150 06/08/2021   HDL 66 06/08/2021   LDLCALC 71 06/08/2021   TRIG 54 06/08/2021   CHOLHDL 2.3 06/08/2021   Lab Results  Component Value Date   CREATININE 1.01 06/08/2021   BUN 12 06/08/2021   NA 139 06/08/2021   K 5.5 (H) 06/08/2021   CL 103 06/08/2021   CO2 30 06/08/2021    SDOH:  (Social Determinants of Health) assessments and interventions performed:    CCM Care Plan  Review of patient past medical history, allergies, medications, health status, including review of consultants reports, laboratory and other test data, was performed as part of comprehensive evaluation and provision of chronic care management services.   Care Plan : PharmD - Medication Assistance  Updates made by Rennis Petty, RPH-CPP since 08/28/2021 12:00 AM     Problem: Disease Progression      Long-Range Goal: Disease Progression Prevented or Minimized   Start Date: 08/17/2020  Expected End Date: 10/16/2020  This Visit's Progress: On track  Recent Progress: On track  Priority: High  Note:   Current Barriers:  Financial Barriers in complicated patient with multiple medical conditions including HTN, T2DM, HLD, BPH and depression; patient has [employer] and reports copay for Ozempic is cost prohibitive at this time Patient APPROVED for patient assistance for [employer] from [employer] for 2023 calendar year  Pharmacist Clinical Goal(s):  Over the next 90 days, patient will maintain control of blood sugar as evidenced by A1C <7%  through collaboration with PharmD and provider.   Interventions: 1:1 collaboration with [name], DO regarding development and update of  comprehensive plan of care as evidenced by provider attestation and co-signature Inter-disciplinary care team collaboration (see longitudinal plan of care)  Medication Assistance: Collaborated with [place] Simcox for aid to patient with re-enrollment in [place] patient assistance program from [employer] for 2023 calendar year Receive message from [place] Simcox advising patient [place] 08/22/21-07/08/22 in [place] patient assistance program with [employer]. Follow up with patient to let him know Reports currently has 4 Ozempic pens remaining.  Type 2 Diabetes Controlled; current treatment: Metformin 1000 mg- 1/2 tablet (500 mg) twice daily Ozempic 1 mg weekly Reports recent fasting blood sugar readings ranging: 90-96 Discuss importance of having regular well-balanced meals, while controlling  carbohydrate portion sizes Exercise: stays active outdoors Patient reports rarely has some night sweats, wonders if might be due to low blood sugar overnight or other cause (such as temperature variations) Denies other hypoglycemia-like symptoms Patient will trial decreasing metformin 1000 mg to  tablet (500 mg) once daily with supper, while continuing current dose of Ozempic Advise patient to continue to monitor fasting blood sugars and bring record with him to upcoming appointment with PCP in March  Hyperlipidemia: Controlled; rosuvastatin 5 mg daily  Hypertension: Controlled; current treatment: benazepril 40 mg daily Reports recent home BP readings: ~120/80  Patient Goals/Self-Care Activities Over the next 90 days, patient will:  - take medications as prescribed - check glucose, document, and provide at future appointments - check blood pressure, document, and provide at future appointments - collaborate with provider on medication access solutions - attend medical appointment as scheduled  Next appointment with PCP on 3/15 (lab work on 3/8)  Follow Up Plan: Telephone  follow up appointment with care management team member scheduled for: 06/13/2022 at 11:15 AM        Wallace Cullens, PharmD, [place], [place] [phone]

## 2021-08-28 NOTE — Patient Instructions (Signed)
Visit Information  Thank you for taking time to visit with me today. Please don't hesitate to contact me if I can be of assistance to you before our next scheduled telephone appointment.  Following are the goals we discussed today:   Goals Addressed             This Visit's Progress    Pharmacy Goals       Our goal A1c is less than 7%. This corresponds with fasting sugars less than 130 and 2 hour after meal sugars less than 180. Please continue keep a log of results when you check your blood sugar at home.  Our goal bad cholesterol, or LDL, is less than 70 . This is why it is important to continue taking your rosuvastatin  Feel free to call me with any questions or concerns. I look forward to our next call!  Wallace Cullens, PharmD, [place] [phone]         Our next appointment is by telephone on 06/13/2022 at 11:15 AM  Please call the care guide team at [phone] if you need to cancel or reschedule your appointment.    Patient verbalizes understanding of instructions and care plan provided today and agrees to view in [place]. Active MyChart status confirmed with patient.

## 2021-09-05 DIAGNOSIS — E1149 Type 2 diabetes mellitus with other diabetic neurological complication: Secondary | ICD-10-CM

## 2021-09-05 DIAGNOSIS — E1169 Type 2 diabetes mellitus with other specified complication: Secondary | ICD-10-CM

## 2021-09-05 DIAGNOSIS — E785 Hyperlipidemia, unspecified: Secondary | ICD-10-CM

## 2021-09-05 DIAGNOSIS — I10 Essential (primary) hypertension: Secondary | ICD-10-CM

## 2021-09-12 ENCOUNTER — Other Ambulatory Visit: Payer: Self-pay

## 2021-09-12 DIAGNOSIS — D61818 Other pancytopenia: Secondary | ICD-10-CM

## 2021-09-12 DIAGNOSIS — E1149 Type 2 diabetes mellitus with other diabetic neurological complication: Secondary | ICD-10-CM

## 2021-09-12 DIAGNOSIS — E875 Hyperkalemia: Secondary | ICD-10-CM

## 2021-09-13 ENCOUNTER — Other Ambulatory Visit: Payer: Medicare HMO

## 2021-09-13 ENCOUNTER — Other Ambulatory Visit: Payer: Self-pay

## 2021-09-13 DIAGNOSIS — D61818 Other pancytopenia: Secondary | ICD-10-CM | POA: Diagnosis not present

## 2021-09-13 DIAGNOSIS — E875 Hyperkalemia: Secondary | ICD-10-CM | POA: Diagnosis not present

## 2021-09-13 DIAGNOSIS — E1149 Type 2 diabetes mellitus with other diabetic neurological complication: Secondary | ICD-10-CM | POA: Diagnosis not present

## 2021-09-14 LAB — CBC WITH DIFFERENTIAL/PLATELET
Absolute Monocytes: 444 cells/uL (ref 200–950)
Basophils Absolute: 30 cells/uL (ref 0–200)
Basophils Relative: 1 %
Eosinophils Absolute: 228 cells/uL (ref 15–500)
Eosinophils Relative: 7.6 %
HCT: 41.4 % (ref 38.5–50.0)
Hemoglobin: 14.1 g/dL (ref 13.2–17.1)
Lymphs Abs: 720 cells/uL — ABNORMAL LOW (ref 850–3900)
MCH: 34.1 pg — ABNORMAL HIGH (ref 27.0–33.0)
MCHC: 34.1 g/dL (ref 32.0–36.0)
MCV: 100 fL (ref 80.0–100.0)
MPV: 10.4 fL (ref 7.5–12.5)
Monocytes Relative: 14.8 %
Neutro Abs: 1578 cells/uL (ref 1500–7800)
Neutrophils Relative %: 52.6 %
Platelets: 122 10*3/uL — ABNORMAL LOW (ref 140–400)
RBC: 4.14 10*6/uL — ABNORMAL LOW (ref 4.20–5.80)
RDW: 13.1 % (ref 11.0–15.0)
Total Lymphocyte: 24 %
WBC: 3 10*3/uL — ABNORMAL LOW (ref 3.8–10.8)

## 2021-09-14 LAB — BASIC METABOLIC PANEL WITH GFR
BUN: 12 mg/dL (ref 7–25)
CO2: 27 mmol/L (ref 20–32)
Calcium: 9.7 mg/dL (ref 8.6–10.3)
Chloride: 102 mmol/L (ref 98–110)
Creat: 1 mg/dL (ref 0.70–1.35)
Glucose, Bld: 143 mg/dL — ABNORMAL HIGH (ref 65–99)
Potassium: 5 mmol/L (ref 3.5–5.3)
Sodium: 138 mmol/L (ref 135–146)
eGFR: 83 mL/min/{1.73_m2} (ref >=60)

## 2021-09-14 LAB — PATHOLOGIST SMEAR REVIEW

## 2021-09-20 ENCOUNTER — Other Ambulatory Visit: Payer: Self-pay

## 2021-09-20 ENCOUNTER — Encounter: Payer: Self-pay | Admitting: Family Medicine

## 2021-09-20 ENCOUNTER — Ambulatory Visit (INDEPENDENT_AMBULATORY_CARE_PROVIDER_SITE_OTHER): Payer: Medicare HMO | Admitting: Family Medicine

## 2021-09-20 ENCOUNTER — Other Ambulatory Visit: Payer: Self-pay | Admitting: Family Medicine

## 2021-09-20 VITALS — BP 138/76 | HR 76 | Ht 71.0 in | Wt 177.0 lb

## 2021-09-20 DIAGNOSIS — N138 Other obstructive and reflux uropathy: Secondary | ICD-10-CM

## 2021-09-20 DIAGNOSIS — Z0000 Encounter for general adult medical examination without abnormal findings: Secondary | ICD-10-CM

## 2021-09-20 DIAGNOSIS — E1169 Type 2 diabetes mellitus with other specified complication: Secondary | ICD-10-CM

## 2021-09-20 DIAGNOSIS — D61818 Other pancytopenia: Secondary | ICD-10-CM

## 2021-09-20 DIAGNOSIS — J3089 Other allergic rhinitis: Secondary | ICD-10-CM | POA: Diagnosis not present

## 2021-09-20 DIAGNOSIS — E785 Hyperlipidemia, unspecified: Secondary | ICD-10-CM | POA: Diagnosis not present

## 2021-09-20 DIAGNOSIS — E1149 Type 2 diabetes mellitus with other diabetic neurological complication: Secondary | ICD-10-CM | POA: Diagnosis not present

## 2021-09-20 MED ORDER — AZELASTINE HCL 0.1 % NA SOLN: 1.0000 | Freq: Two times a day (BID) | NASAL | 12 refills | Status: DC

## 2021-09-20 NOTE — Assessment & Plan Note (Signed)
The 10-year ASCVD risk score (Arnett DK, et al., 2019) is: 23.4% ?Prior statin failed Simvastatin (stopped due to LFTs, however chart review shows unlikely d/t statin since still elevated >1 year off statin, likely from prior alcohol history) ? ?Plan: ?1.Continue Rosuvastatin '5mg'$  daily - reconsider if causing muscle cramps in future ?2. Continue ASA '81mg'$  for primary ASCVD risk reduction ?3. Encourage improved lifestyle - low carb/cholesterol, reduce portion size, continue improving regular exercise ?

## 2021-09-20 NOTE — Progress Notes (Signed)
? ?Subjective:  ? ? Patient ID: Richard Columbia Sr., male    DOB: April 19, 1956, 66 y.o.   MRN: [id number] ? ?Richard Livermore Pelley Sr. is a 66 y.o. male presenting on 09/20/2021 for Diabetes ? ? ?HPI ? ?Pancytopenia ?Low WBC on last lab, now this is repeat. And path smear showed pancytopenia. ?Pancytopenia identified on CBC, WBC 2.8-3, RBC 4, PLT 120 range, appears to have MCV elevated but pathology smear diagnosed pancytopenia and hypochromic RBC suggesting anemia ?He admits feeling cold at times. ?He takes B12 vitamin ? ?CHRONIC DM, Type 2 with DM Neuropathy / BMI >24 ?Hypertension ?- Last lab A1c 5.6 improved ?- Today reports he continues to do very well at this time ?CBGs: Checks CBGs 1-3x daily - avg 120-140, rarely low ?Meds: ?- Ozempic 34m weekly (PAP program) ?- Metformin 502mBID (half of 100065m- reducing dose. ?- Failed Victoza in past, OFF Glimepiride, OFF NPH Insulin ?Reports good compliance. Tolerating well w/o side-effects ?Currently on ACEi ?Lifestyle: ?- Diet (improved diet overall still - reduced portions) ?- Exercise (increased activity and exercise) ?Chronic issue with known, Bilateral feet, mild loss of sensitivity Taking Vitamin B12 Magnesium, MVI, Melatonin. Admits tingling and neuropathy in feet bilateral. Takes OTC medicine for neuropathy. Still has cramping pain in both calves - worse in AM when wakes up otherwise not bad. ?On gabapentin 100m78mD limited relief ?- Last DM Eye exam by Dr BellGloriann Loand copy of report. ?Denies hypoglycemia, polyuria, visual changes or tingling. ? ?Admits occasional night sweats, usually on weekends. ? ?Sinusitis, allergic ?Chronic year round, tried allergy pills and OTC nasal sprays but has rebound. ?  ?Major Depression, recurrent in remission / Insomnia ?Alcohol Dependence in remission ?Reports his mood has been good overall without new concerns. ?Continues on SSRI Sertraline 50mg13mly with great results ?Continues on Trazodone 1.5 pills for 75mg 4mtly for insomnia. ?   ?HYPERLIPIDEMIA: ?- Reports no concerns. Last lipid panel 06/2021 major improved LDL to 71 from previous 124 pre statin ?On Rosuvastatin 5mg ni25mly not causing side effects ?  ?CHRONIC HTN: ?Reports no concerns, some lower BP lately ?Current Meds - Benazepril 40mg da25m  ?Reports good compliance, took meds today. Tolerating well, w/o complaints. ?Denies CP, dyspnea, HA, edema, dizziness / lightheadedness ? ? ?Depression screen PHQ 2/9 [facility]5/2023 06/14/2021 02/28/2021  ?Decreased Interest 0 0 0  ?Down, Depressed, Hopeless 0 0 0  ?PHQ - 2 Score 0 0 0  ?Altered sleeping 0 0 -  ?Tired, decreased energy 1 0 -  ?Change in appetite 0 0 -  ?Feeling bad or failure about yourself  0 0 -  ?Trouble concentrating 0 0 -  ?Moving slowly or fidgety/restless 0 0 -  ?Suicidal thoughts 0 0 -  ?PHQ-9 Score 1 0 -  ?Difficult doing work/chores Not difficult at all Not difficult at all -  ?Some recent data might be hidden  ? ? ?Social History  ? ?Tobacco Use  ? Smoking status: Former  ?  Years: 10.00  ?  Types: Cigarettes  ?  Quit date: 08/08/1999  ?  Years since quitting: 22.1  ? Smokeless tobacco: Former  ?Vaping Use  ? Vaping Use: Never used  ?Substance Use Topics  ? Alcohol use: No  ?  Alcohol/week: 0.0 standard drinks  ?  Comment: Has requested Antabuse at previous PCP visit.   ? Drug use: No  ? ? ?Review of Systems ?Per HPI unless specifically indicated above ? ?   ?Objective:  ?  ?  BP 138/76 (BP Location: Left Arm, Cuff Size: Normal)   Pulse 76   Ht 5' 11"  (1.803 m)   Wt 177 lb (80.3 kg)   SpO2 100%   BMI 24.69 kg/m?   ?Wt Readings from Last 3 Encounters:  ?09/20/21 177 lb (80.3 kg)  ?06/15/21 171 lb (77.6 kg)  ?06/14/21 171 lb (77.6 kg)  ?  ?Physical Exam ?Vitals and nursing note reviewed.  ?Constitutional:   ?   General: He is not in acute distress. ?   Appearance: Normal appearance. He is well-developed. He is not diaphoretic.  ?   Comments: Well-appearing, comfortable, cooperative  ?HENT:  ?   Head: Normocephalic and  atraumatic.  ?Eyes:  ?   General:     ?   Right eye: No discharge.     ?   Left eye: No discharge.  ?   Conjunctiva/sclera: Conjunctivae normal.  ?Cardiovascular:  ?   Rate and Rhythm: Normal rate.  ?Pulmonary:  ?   Effort: Pulmonary effort is normal.  ?Skin: ?   General: Skin is warm and dry.  ?   Findings: No erythema or rash.  ?Neurological:  ?   Mental Status: He is alert and oriented to person, place, and time.  ?Psychiatric:     ?   Mood and Affect: Mood normal.     ?   Behavior: Behavior normal.     ?   Thought Content: Thought content normal.  ?   Comments: Well groomed, good eye contact, normal speech and thoughts  ? ? ?Recent Labs  ?  01/16/21 ?1655 06/08/21 ?0955  ?HGBA1C 5.4 5.6  ? ? ? ?Results for orders placed or performed in visit on 09/12/21  ?Pathologist smear review  ?Result Value Ref Range  ? Path Review    ?BASIC METABOLIC PANEL WITH GFR  ?Result Value Ref Range  ? Glucose, Bld 143 (H) 65 - 99 mg/dL  ? BUN 12 7 - 25 mg/dL  ? Creat 1.00 0.70 - 1.35 mg/dL  ? eGFR 83 > OR = 60 mL/min/1.2m  ? BUN/Creatinine Ratio NOT APPLICABLE 6 - 22 (calc)  ? Sodium 138 135 - 146 mmol/L  ? Potassium 5.0 3.5 - 5.3 mmol/L  ? Chloride 102 98 - 110 mmol/L  ? CO2 27 20 - 32 mmol/L  ? Calcium 9.7 8.6 - 10.3 mg/dL  ?CBC with Differential/Platelet  ?Result Value Ref Range  ? WBC 3.0 (L) 3.8 - 10.8 Thousand/uL  ? RBC 4.14 (L) 4.20 - 5.80 Million/uL  ? Hemoglobin 14.1 13.2 - 17.1 g/dL  ? HCT 41.4 38.5 - 50.0 %  ? MCV 100.0 80.0 - 100.0 fL  ? MCH 34.1 (H) 27.0 - 33.0 pg  ? MCHC 34.1 32.0 - 36.0 g/dL  ? RDW 13.1 11.0 - 15.0 %  ? Platelets 122 (L) 140 - 400 Thousand/uL  ? MPV 10.4 7.5 - 12.5 fL  ? Neutro Abs 1,578 1,500 - 7,800 cells/uL  ? Lymphs Abs 720 (L) 850 - 3,900 cells/uL  ? Absolute Monocytes 444 200 - 950 cells/uL  ? Eosinophils Absolute 228 15 - 500 cells/uL  ? Basophils Absolute 30 0 - 200 cells/uL  ? Neutrophils Relative % 52.6 %  ? Total Lymphocyte 24.0 %  ? Monocytes Relative 14.8 %  ? Eosinophils Relative 7.6 %   ? Basophils Relative 1.0 %  ? ?   ?Assessment & Plan:  ? ?Problem List Items Addressed This Visit   ? ? Hyperlipidemia associated with type  2 diabetes mellitus ([place])  ?  The 10-year ASCVD risk score (Arnett DK, et al., 2019) is: 23.4% ?Prior statin failed Simvastatin (stopped due to LFTs, however chart review shows unlikely d/t statin since still elevated >1 year off statin, likely from prior alcohol history) ? ?Plan: ?1.Continue Rosuvastatin 52m daily - reconsider if causing muscle cramps in future ?2. Continue ASA 8622mfor primary ASCVD risk reduction ?3. Encourage improved lifestyle - low carb/cholesterol, reduce portion size, continue improving regular exercise ?  ?  ? Relevant Medications  ? metFORMIN (GLUCOPHAGE) 500 MG tablet  ? DM (diabetes mellitus), type 2 with neurological complications ([place]- Primary  ?  Controlled DM A1c 5.6 on med and lifestyle ?Complications - peripheral neuropathy, other including hyperlipidemia, GERD, depression - increases risk of future cardiovascular complications poor glucose control due to reduced lifestyle diet/exercise with low energy mood and fatigue ?- Failed Victoza. OFF Glimepiride and NPH Insulin ? ?Plan:  ?1. CONTINUE dose for Ozepmic up to 22m29meekly injection (PAP program) ?2. Keep reducing Metformin from 500 BID down further and off if tolerated ?2. Encourage improved lifestyle - low carb, low sugar diet, reduce portion size, continue improving regular exercise ?3. Check CBG, bring log to next visit for review ?- For DM Neuropathy - keep on Gabapentin - adjust dose - 300m2mS and maybe 100mg67minstead of 100 TID - sinc waking up with neuropathy cramping symptoms - we can refer to Neurology next if need ?4. Continue ASA, ACEi ?  ?  ? Relevant Medications  ? metFORMIN (GLUCOPHAGE) 500 MG tablet  ? Allergic rhinitis  ?  Chronic yearly rhinitis ?Azelastine rx regular dosing, seasonal ?Stop Oxymetazoline to avoid rebound ?Keep on oral anti histamine ?  ?  ? Relevant  Medications  ? azelastine (ASTELIN) 0.1 % nasal spray  ? ?Other Visit Diagnoses   ? ? Pancytopenia (HCC) [place]  ? Relevant Orders  ? Ambulatory referral to Hematology / Oncology  ? ?  ?  ?Pancytopenia identified on

## 2021-09-20 NOTE — Assessment & Plan Note (Signed)
Chronic yearly rhinitis ?Azelastine rx regular dosing, seasonal ?Stop Oxymetazoline to avoid rebound ?Keep on oral anti histamine ?

## 2021-09-20 NOTE — Patient Instructions (Addendum)
Thank you for coming to the office today. ? ?[facility] (Hematology/Oncology) ?[address] ?[place], [place] [address] ?Phone: [phone] ? ?Recent Labs  ?  01/16/21 ?3382 06/08/21 ?0955  ?HGBA1C 5.4 5.6  ? ?Keep trying to reduce Metformin in future. Down to '500mg'$  once daily with meal for period of time, then if doing well, can completely come off of the metformin, and keep ozempic. ? ?Forgetfulness is very common, can be normal. As long as it does not involve cognitive decline or confusion ? ?Stop other allergy spray Oxyzmetazoline  ? ?DUE for FASTING BLOOD WORK (no food or drink after midnight before the lab appointment, only water or coffee without cream/sugar on the morning of) ? ?SCHEDULE "Lab Only" visit in the morning at the clinic for lab draw in 9 MONTHS  ? ?- Make sure Lab Only appointment is at about 1 week before your next appointment, so that results will be available ? ?For Lab Results, once available within 2-3 days of blood draw, you can can log in to MyChart online to view your results and a brief explanation. Also, we can discuss results at next follow-up visit. ? ? ?Please schedule a Follow-up Appointment to: Return in about 9 months (around 06/22/2022) for 9 fasting lab only then 1 week later Annual Physical. ? ?If you have any other questions or concerns, please feel free to call the office or send a message through [facility]. You may also schedule an earlier appointment if necessary. ? ?Additionally, you may be receiving a survey about your experience at our office within a few days to 1 week by e-mail or mail. We value your feedback. ? ?Nobie Putnam, DO ?[facility] ?

## 2021-09-20 NOTE — Assessment & Plan Note (Signed)
Controlled DM A1c 5.6 on med and lifestyle ?Complications - peripheral neuropathy, other including hyperlipidemia, GERD, depression - increases risk of future cardiovascular complications poor glucose control due to reduced lifestyle diet/exercise with low energy mood and fatigue ?- Failed Victoza. OFF Glimepiride and NPH Insulin ? ?Plan:  ?1. CONTINUE dose for Ozepmic up to '1mg'$  weekly injection (PAP program) ?2. Keep reducing Metformin from 500 BID down further and off if tolerated ?2. Encourage improved lifestyle - low carb, low sugar diet, reduce portion size, continue improving regular exercise ?3. Check CBG, bring log to next visit for review ?- For DM Neuropathy - keep on Gabapentin - adjust dose - '300mg'$  QHS and maybe '100mg'$  AM instead of 100 TID - sinc waking up with neuropathy cramping symptoms - we can refer to Neurology next if need ?4. Continue ASA, ACEi ?

## 2021-09-27 ENCOUNTER — Encounter: Payer: Self-pay | Admitting: *Deleted

## 2021-10-06 ENCOUNTER — Encounter: Payer: Self-pay | Admitting: Oncology

## 2021-10-09 ENCOUNTER — Inpatient Hospital Stay: Payer: Medicare HMO | Attending: Oncology | Admitting: Oncology

## 2021-10-09 ENCOUNTER — Inpatient Hospital Stay: Payer: Medicare HMO

## 2021-10-09 ENCOUNTER — Other Ambulatory Visit: Payer: Self-pay

## 2021-10-09 ENCOUNTER — Encounter: Payer: Self-pay | Admitting: Oncology

## 2021-10-09 VITALS — BP 129/72 | HR 75 | Temp 98.7°F | Resp 20 | Wt 175.8 lb

## 2021-10-09 DIAGNOSIS — F1090 Alcohol use, unspecified, uncomplicated: Secondary | ICD-10-CM | POA: Insufficient documentation

## 2021-10-09 DIAGNOSIS — Z7984 Long term (current) use of oral hypoglycemic drugs: Secondary | ICD-10-CM | POA: Diagnosis not present

## 2021-10-09 DIAGNOSIS — K760 Fatty (change of) liver, not elsewhere classified: Secondary | ICD-10-CM | POA: Diagnosis not present

## 2021-10-09 DIAGNOSIS — E785 Hyperlipidemia, unspecified: Secondary | ICD-10-CM | POA: Diagnosis not present

## 2021-10-09 DIAGNOSIS — Z7982 Long term (current) use of aspirin: Secondary | ICD-10-CM | POA: Insufficient documentation

## 2021-10-09 DIAGNOSIS — D72819 Decreased white blood cell count, unspecified: Secondary | ICD-10-CM | POA: Insufficient documentation

## 2021-10-09 DIAGNOSIS — D72818 Other decreased white blood cell count: Secondary | ICD-10-CM | POA: Diagnosis not present

## 2021-10-09 DIAGNOSIS — D696 Thrombocytopenia, unspecified: Secondary | ICD-10-CM

## 2021-10-09 DIAGNOSIS — D61818 Other pancytopenia: Secondary | ICD-10-CM | POA: Insufficient documentation

## 2021-10-09 DIAGNOSIS — Z789 Other specified health status: Secondary | ICD-10-CM

## 2021-10-09 DIAGNOSIS — Z79899 Other long term (current) drug therapy: Secondary | ICD-10-CM | POA: Diagnosis not present

## 2021-10-09 DIAGNOSIS — K219 Gastro-esophageal reflux disease without esophagitis: Secondary | ICD-10-CM | POA: Diagnosis not present

## 2021-10-09 DIAGNOSIS — Z801 Family history of malignant neoplasm of trachea, bronchus and lung: Secondary | ICD-10-CM | POA: Diagnosis not present

## 2021-10-09 LAB — CBC WITH DIFFERENTIAL/PLATELET
Abs Immature Granulocytes: 0.06 10*3/uL (ref 0.00–0.07)
Basophils Absolute: 0.1 10*3/uL (ref 0.0–0.1)
Basophils Relative: 2 %
Eosinophils Absolute: 0.2 10*3/uL (ref 0.0–0.5)
Eosinophils Relative: 7 %
HCT: 42.1 % (ref 39.0–52.0)
Hemoglobin: 14.2 g/dL (ref 13.0–17.0)
Immature Granulocytes: 2 %
Lymphocytes Relative: 17 %
Lymphs Abs: 0.6 10*3/uL — ABNORMAL LOW (ref 0.7–4.0)
MCH: 33.3 pg (ref 26.0–34.0)
MCHC: 33.7 g/dL (ref 30.0–36.0)
MCV: 98.8 fL (ref 80.0–100.0)
Monocytes Absolute: 0.4 10*3/uL (ref 0.1–1.0)
Monocytes Relative: 12 %
Neutro Abs: 2.1 10*3/uL (ref 1.7–7.7)
Neutrophils Relative %: 60 %
Platelets: 126 10*3/uL — ABNORMAL LOW (ref 150–400)
RBC: 4.26 MIL/uL (ref 4.22–5.81)
RDW: 13.9 % (ref 11.5–15.5)
WBC: 3.4 10*3/uL — ABNORMAL LOW (ref 4.0–10.5)
nRBC: 0 % (ref 0.0–0.2)

## 2021-10-09 LAB — LACTATE DEHYDROGENASE: LDH: 119 U/L (ref 98–192)

## 2021-10-09 LAB — RETIC PANEL
Immature Retic Fract: 9.2 % (ref 2.3–15.9)
RBC.: 4.26 MIL/uL (ref 4.22–5.81)
Retic Count, Absolute: 63.9 10*3/uL (ref 19.0–186.0)
Retic Ct Pct: 1.5 % (ref 0.4–3.1)
Reticulocyte Hemoglobin: 37.8 pg (ref >27.9)

## 2021-10-09 LAB — HEPATITIS PANEL, ACUTE
HCV Ab: NONREACTIVE
Hep A IgM: NONREACTIVE
Hep B C IgM: NONREACTIVE
Hepatitis B Surface Ag: NONREACTIVE

## 2021-10-09 LAB — COMPREHENSIVE METABOLIC PANEL
ALT: 44 U/L (ref 0–44)
AST: 56 U/L — ABNORMAL HIGH (ref 15–41)
Albumin: 4.4 g/dL (ref 3.5–5.0)
Alkaline Phosphatase: 105 U/L (ref 38–126)
Anion gap: 7 (ref 5–15)
BUN: 18 mg/dL (ref 8–23)
CO2: 27 mmol/L (ref 22–32)
Calcium: 9.8 mg/dL (ref 8.9–10.3)
Chloride: 102 mmol/L (ref 98–111)
Creatinine, Ser: 1.16 mg/dL (ref 0.61–1.24)
GFR, Estimated: >60 mL/min (ref >60)
Glucose, Bld: 155 mg/dL — ABNORMAL HIGH (ref 70–99)
Potassium: 4.6 mmol/L (ref 3.5–5.1)
Sodium: 136 mmol/L (ref 135–145)
Total Bilirubin: 0.7 mg/dL (ref 0.3–1.2)
Total Protein: 7.7 g/dL (ref 6.5–8.1)

## 2021-10-09 LAB — HIV ANTIBODY (ROUTINE TESTING W REFLEX): HIV Screen 4th Generation wRfx: NONREACTIVE

## 2021-10-09 LAB — TECHNOLOGIST SMEAR REVIEW: Plt Morphology: DECREASED

## 2021-10-09 LAB — IRON AND TIBC
Iron: 58 ug/dL (ref 45–182)
Saturation Ratios: 15 % — ABNORMAL LOW (ref 17.9–39.5)
TIBC: 399 ug/dL (ref 250–450)
UIBC: 341 ug/dL

## 2021-10-09 LAB — IMMATURE PLATELET FRACTION: Immature Platelet Fraction: 2.9 % (ref 1.2–8.6)

## 2021-10-09 LAB — VITAMIN B12: Vitamin B-12: 1760 pg/mL — ABNORMAL HIGH (ref 180–914)

## 2021-10-09 LAB — FERRITIN: Ferritin: 120 ng/mL (ref 24–336)

## 2021-10-09 LAB — FOLATE: Folate: 58 ng/mL (ref >5.9)

## 2021-10-09 NOTE — Progress Notes (Signed)
?Hematology/Oncology Consult note ?[phone] [phone] ?  ? ?   ? ? ?Patient Care Team: ?[name], DO as PCP - General (Family Medicine) ?Earlie Server, MD as Consulting Physician (Hematology and Oncology) ? ?REFERRING PROVIDER: ?Nobie Putnam *  ?CHIEF COMPLAINTS/REASON FOR VISIT:  ?Evaluation of pancytopenia ? ?HISTORY OF PRESENTING ILLNESS:  ? ?Richard SCHALK Sr. is a  66 y.o.  male with PMH listed below was seen in consultation at the request of  Nobie Putnam *  for evaluation of pancytopenia ? ?Patient has had some blood work done obtained by primary care provider. ?09/13/2021, WBC 3, differential showed decreased absolute lymphocyte 0.7.  Thrombocytopenia 122,000.  Normal hemoglobin 14.1. ?09/13/2021, pathology smear showed pancytopenia.  No platelet counts.  Myeloid population consist predominantly of mature segmented neutrophils.  Rare atypical lymphocytes.  No immature cells are identified.  Anemia with RBCs which appear to be borderline microcytic and hypochromic on smear review. ?Reviewed previous lab records ?WBC was 2.8 in December 2022, platelet count 124 at that time ?Patient had normal platelet count of 153 on 06/08/2020. ? ?Patient drinks 12 cans of beer daily.  He used to drink hard liquor and has quitting many years ago.   ?Patient recently had started vitamin B12 supplementation, iron supplementation. ? ?Review of Systems  ?Constitutional:  Negative for appetite change, chills, fatigue, fever and unexpected weight change.  ?HENT:   Negative for hearing loss and voice change.   ?Eyes:  Negative for eye problems and icterus.  ?Respiratory:  Negative for chest tightness, cough and shortness of breath.   ?Cardiovascular:  Negative for chest pain and leg swelling.  ?Gastrointestinal:  Negative for abdominal distention and abdominal pain.  ?Endocrine: Negative for hot flashes.  ?Genitourinary:  Negative for difficulty urinating, dysuria and frequency.    ?Musculoskeletal:  Negative for arthralgias.  ?Skin:  Negative for itching and rash.  ?Neurological:  Negative for light-headedness and numbness.  ?Hematological:  Negative for adenopathy. Does not bruise/bleed easily.  ?Psychiatric/Behavioral:  Negative for confusion.   ? ?MEDICAL HISTORY:  ?Past Medical History:  ?Diagnosis Date  ? Anxiety   ? Cholelithiasis   ? DM (diabetes mellitus), type 2 with neurological complications ([place])   ? GERD (gastroesophageal reflux disease)   ? Hyperlipidemia   ? Pancytopenia ([place])   ? Seasonal allergic rhinitis due to other allergic trigger   ? ? ?SURGICAL HISTORY: ?Past Surgical History:  ?Procedure Laterality Date  ? Lung Collapse  66 years old.   ? ? ?SOCIAL HISTORY: ?Social History  ? ?Socioeconomic History  ? Marital status: Married  ?  Spouse name: Not on file  ? Number of children: Not on file  ? Years of education: Not on file  ? Highest education level: Not on file  ?Occupational History  ? Not on file  ?Tobacco Use  ? Smoking status: Former  ?  Years: 10.00  ?  Types: Cigarettes  ?  Quit date: 08/08/1999  ?  Years since quitting: 22.1  ? Smokeless tobacco: Former  ?Vaping Use  ? Vaping Use: Never used  ?Substance and Sexual Activity  ? Alcohol use: No  ?  Alcohol/week: 0.0 standard drinks  ?  Comment: Has requested Antabuse at previous PCP visit.   ? Drug use: No  ? Sexual activity: Yes  ?Other Topics Concern  ? Not on file  ?Social History Narrative  ? Not on file  ? ?Social Determinants of Health  ? ?Financial Resource Strain: Low Risk   ?  Difficulty of Paying Living Expenses: Not hard at all  ?Food Insecurity: No Food Insecurity  ? Worried About Charity fundraiser in the Last Year: Never true  ? Ran Out of Food in the Last Year: Never true  ?Transportation Needs: No Transportation Needs  ? Lack of Transportation (Medical): No  ? Lack of Transportation (Non-Medical): No  ?Physical Activity: Sufficiently Active  ? Days of Exercise per Week: 7 days  ? Minutes of Exercise  per Session: 30 min  ?Stress: No Stress Concern Present  ? Feeling of Stress : Not at all  ?Social Connections: Not on file  ?Intimate Partner Violence: Not on file  ? ? ?FAMILY HISTORY: ?Family History  ?Problem Relation Age of Onset  ? Diabetes Father   ?     complications of DM caused death  ? Heart attack Father   ? Diabetes Sister   ? Cancer Mother   ?     lung  ? Lung cancer Mother   ? ? ?ALLERGIES:  is allergic to bee venom. ? ?MEDICATIONS:  ?Current Outpatient Medications  ?Medication Sig Dispense Refill  ? allopurinol (ZYLOPRIM) 300 MG tablet Take 1 tablet (300 mg total) by mouth daily. 90 tablet 3  ? azelastine (ASTELIN) 0.1 % nasal spray Place 1 spray into both nostrils 2 (two) times daily. Use in each nostril as directed 30 mL 12  ? benazepril (LOTENSIN) 40 MG tablet TAKE 1 TABLET BY MOUTH DAILY AT 6 IN THE MORNING. 90 tablet 1  ? Blood Glucose Calibration (OT ULTRA/FASTTK CNTRL SOLN) SOLN Use as instructed to check blood sugars twice a day. 1 each 5  ? Blood Glucose Monitoring Suppl (ONE TOUCH ULTRA 2) w/Device KIT Use as instructed to check blood sugars twice a day. 1 kit 0  ? gabapentin (NEURONTIN) 100 MG capsule TAKE ONE CAPSULE BY MOUTH THREE TIMES A DAY 90 capsule 5  ? Iron, Ferrous Sulfate, 325 (65 Fe) MG TABS     ? ketoconazole (NIZORAL) 2 % shampoo Apply 1 application topically 2 (two) times a week. As needed for scalp dermatitis 120 mL 0  ? Lancets Misc. (ONE TOUCH SURESOFT) MISC Use as instructed to check blood sugars twice a day. 100 each 12  ? Magnesium Gluconate 500 (27 MG) MG TABS Take 1 tablet by mouth every evening.     ? melatonin 5 MG TABS Take 1 tablet by mouth at bedtime.     ? metFORMIN (GLUCOPHAGE) 500 MG tablet Take 1 tablet (500 mg total) by mouth 2 (two) times daily with a meal. 180 tablet 3  ? MULTIPLE VITAMIN PO Take by mouth.    ? ONETOUCH ULTRA test strip Use as instructed to check blood sugars twice a day. 100 each 12  ? OZEMPIC, 1 MG/DOSE, 4 MG/3ML SOPN Inject 1 mg into  the skin once a week. 3 mL 0  ? rosuvastatin (CRESTOR) 5 MG tablet TAKE ONE TABLET BY MOUTH EVERY NIGHT AT BEDTIME 90 tablet 3  ? saw palmetto 160 MG capsule Take 1 capsule (160 mg total) by mouth 2 (two) times daily.    ? sertraline (ZOLOFT) 50 MG tablet TAKE ONE TABLET BY MOUTH DAILY 90 tablet 3  ? sildenafil (REVATIO) 20 MG tablet Take 3-5 tablets daily by mouth as needed 90 tablet 1  ? tamsulosin (FLOMAX) 0.4 MG CAPS capsule Take 2 capsules (0.8 mg total) by mouth daily. 180 capsule 3  ? traZODone (DESYREL) 50 MG tablet TAKE ONE AND ONE-HALF TABLETS  BY MOUTH AT BEDTIME 135 tablet 3  ? vitamin B-12 (CYANOCOBALAMIN) 1000 MCG tablet Take by mouth.    ? zinc gluconate 50 MG tablet Take 50 mg by mouth daily.    ? aspirin 81 MG tablet Take 1 tablet by mouth daily at 6 (six) AM.    ? famotidine (PEPCID) 20 MG tablet Take 20 mg by mouth in the morning.    ? ?No current facility-administered medications for this visit.  ? ? ? ?PHYSICAL EXAMINATION: ?ECOG PERFORMANCE STATUS: 0 - Asymptomatic ?Vitals:  ? 10/09/21 0920  ?BP: 129/72  ?Pulse: 75  ?Resp: 20  ?Temp: 98.7 ?F (37.1 ?C)  ?SpO2: 100%  ? ?Filed Weights  ? 10/09/21 0920  ?Weight: 175 lb 12.8 oz (79.7 kg)  ? ? ?Physical Exam ?Constitutional:   ?   General: He is not in acute distress. ?HENT:  ?   Head: Normocephalic and atraumatic.  ?Eyes:  ?   General: No scleral icterus. ?Cardiovascular:  ?   Rate and Rhythm: Normal rate and regular rhythm.  ?   Heart sounds: Normal heart sounds.  ?Pulmonary:  ?   Effort: Pulmonary effort is normal. No respiratory distress.  ?   Breath sounds: No wheezing.  ?Abdominal:  ?   General: Bowel sounds are normal. There is no distension.  ?   Palpations: Abdomen is soft.  ?Musculoskeletal:     ?   General: No deformity. Normal range of motion.  ?   Cervical back: Normal range of motion and neck supple.  ?Skin: ?   General: Skin is warm and dry.  ?   Findings: No erythema or rash.  ?Neurological:  ?   Mental Status: He is alert and oriented  to person, place, and time. Mental status is at baseline.  ?   Cranial Nerves: No cranial nerve deficit.  ?   Coordination: Coordination normal.  ?Psychiatric:     ?   Mood and Affect: Mood normal.  ?

## 2021-10-09 NOTE — Progress Notes (Signed)
Patient states he started eating again with a special diet.  ?

## 2021-10-10 LAB — KAPPA/LAMBDA LIGHT CHAINS
Kappa free light chain: 52.1 mg/L — ABNORMAL HIGH (ref 3.3–19.4)
Kappa, lambda light chain ratio: 2.15 — ABNORMAL HIGH (ref 0.26–1.65)
Lambda free light chains: 24.2 mg/L (ref 5.7–26.3)

## 2021-10-11 LAB — MULTIPLE MYELOMA PANEL, SERUM
Albumin SerPl Elph-Mcnc: 4.1 g/dL (ref 2.9–4.4)
Albumin/Glob SerPl: 1.3 (ref 0.7–1.7)
Alpha 1: 0.2 g/dL (ref 0.0–0.4)
Alpha2 Glob SerPl Elph-Mcnc: 0.6 g/dL (ref 0.4–1.0)
B-Globulin SerPl Elph-Mcnc: 1.5 g/dL — ABNORMAL HIGH (ref 0.7–1.3)
Gamma Glob SerPl Elph-Mcnc: 1.1 g/dL (ref 0.4–1.8)
Globulin, Total: 3.3 g/dL (ref 2.2–3.9)
IgA: 958 mg/dL — ABNORMAL HIGH (ref 61–437)
IgG (Immunoglobin G), Serum: 1104 mg/dL (ref 603–1613)
IgM (Immunoglobulin M), Srm: 211 mg/dL — ABNORMAL HIGH (ref 20–172)
M Protein SerPl Elph-Mcnc: 0.3 g/dL — ABNORMAL HIGH
Total Protein ELP: 7.4 g/dL (ref 6.0–8.5)

## 2021-10-11 LAB — COMP PANEL: LEUKEMIA/LYMPHOMA

## 2021-10-16 ENCOUNTER — Other Ambulatory Visit: Payer: Self-pay | Admitting: Family Medicine

## 2021-10-16 DIAGNOSIS — E1149 Type 2 diabetes mellitus with other diabetic neurological complication: Secondary | ICD-10-CM

## 2021-10-16 DIAGNOSIS — F339 Major depressive disorder, recurrent, unspecified: Secondary | ICD-10-CM

## 2021-10-17 NOTE — Telephone Encounter (Signed)
Metformin was sent in for different dosage on 09/20/21.  ?Requested Prescriptions  ?Pending Prescriptions Disp Refills  ?? traZODone (DESYREL) 50 MG tablet [Pharmacy Med Name: traZODone 50 MG TABLET] 135 tablet 0  ?  Sig: TAKE 1.5 TABLET BY MOUTH EVERY NIGHT AT BEDTIME  ?  ? Psychiatry: Antidepressants - Serotonin Modulator Passed - 10/16/2021 12:01 PM  ?  ?  Passed - Completed PHQ-2 or PHQ-9 in the last 360 days  ?  ?  Passed - Valid encounter within last 6 months  ?  Recent Outpatient Visits   ?      ? 3 weeks ago DM (diabetes mellitus), type 2 with neurological complications ([place])  ? [place], DO  ? 4 months ago Annual physical exam  ? [place], DO  ? 1 year ago DM (diabetes mellitus), type 2 with neurological complications [facility])  ? [place], DO  ? 1 year ago Annual physical exam  ? [place], DO  ? 1 year ago DM (diabetes mellitus), type 2 with neurological complications [facility])  ? [place], DO  ?  ?  ?Future Appointments   ?        ? In 4 months  [facility], [place]  ? In 8 months [name], [name], MD [place]  ? In 8 months Parks Ranger, [facility], [facility]  ?  ? ?  ?  ?  ?? gabapentin (NEURONTIN) 100 MG capsule [Pharmacy Med Name: GABAPENTIN 100 MG CAPSULE] 90 capsule 5  ?  Sig: TAKE ONE CAPSULE BY MOUTH THREE TIMES A DAY  ?  ? Neurology: Anticonvulsants - gabapentin Passed - 10/16/2021 12:01 PM  ?  ?  Passed - Cr in normal range and within 360 days  ?  Creat  ?Date Value Ref Range Status  ?09/13/2021 1.00 0.70 - 1.35 mg/dL Final  ? ?Creatinine, Ser  ?Date Value Ref Range Status  ?10/09/2021 1.16 0.61 - 1.24 mg/dL Final  ?   ?  ?  Passed - Completed PHQ-2 or PHQ-9 in the last 360 days  ?  ?  Passed - Valid encounter within last 12  months  ?  Recent Outpatient Visits   ?      ? 3 weeks ago DM (diabetes mellitus), type 2 with neurological complications ([place])  ? [place], DO  ? 4 months ago Annual physical exam  ? [place], DO  ? 1 year ago DM (diabetes mellitus), type 2 with neurological complications [facility])  ? [place], DO  ? 1 year ago Annual physical exam  ? [place], DO  ? 1 year ago DM (diabetes mellitus), type 2 with neurological complications [facility])  ? [place], DO  ?  ?  ?Future Appointments   ?        ? In 4 months  [facility], [place]  ? In 8 months [name], [name], MD [place]  ? In 8 months Parks Ranger, [name], DO [facility], [place]  ?  ? ?  ?  ?  ?Refused Prescriptions Disp Refills  ?? metFORMIN (GLUCOPHAGE) 1000 MG tablet [Pharmacy Med Name: metFORMIN HCL  1,000 MG TABLET] 90 tablet 2  ?  Sig: TAKE 1/2 TABLET BY MOUTH TWICE A DAY WITH MEALS  ?  ? Endocrinology:  Diabetes - Biguanides Failed - 10/16/2021 12:01 PM  ?  ?  Failed - B12 Level in normal range and within 720 days  ?  Vitamin B-12  ?Date Value Ref Range Status  ?10/09/2021 1,760 (H) 180 - 914 pg/mL Final  ?  Comment:  ?  (NOTE) ?This assay is not validated for testing neonatal or ?myeloproliferative syndrome specimens for Vitamin B12 levels. ?Performed at [facility], [place] [address], [place], [place] ?[address] ?  ?   ?  ?  Failed - CBC within normal limits and completed in the last 12 months  ?  WBC  ?Date Value Ref Range Status  ?10/09/2021 3.4 (L) 4.0 - 10.5 K/uL Final  ? ?RBC  ?Date Value Ref Range Status  ?10/09/2021 4.26 4.22 - 5.81 MIL/uL Final  ? ?RBC.  ?Date Value Ref Range Status  ?10/09/2021 4.26 4.22 - 5.81 MIL/uL Final  ? ?Hemoglobin  ?Date Value Ref Range Status  ?10/09/2021 14.2  13.0 - 17.0 g/dL Final  ? ?HCT  ?Date Value Ref Range Status  ?10/09/2021 42.1 39.0 - 52.0 % Final  ? ?MCHC  ?Date Value Ref Range Status  ?10/09/2021 33.7 30.0 - 36.0 g/dL Final  ? ?MCH  ?Date Value Ref Range Status  ?10/09/2021 33.3 26.0 - 34.0 pg Final  ? ?MCV  ?Date Value Ref Range Status  ?10/09/2021 98.8 80.0 - 100.0 fL Final  ? ?No results found for: PLTCOUNTKUC, LABPLAT, [place] ?RDW  ?Date Value Ref Range Status  ?10/09/2021 13.9 11.5 - 15.5 % Final  ? ?  ?  ?  Passed - Cr in normal range and within 360 days  ?  Creat  ?Date Value Ref Range Status  ?09/13/2021 1.00 0.70 - 1.35 mg/dL Final  ? ?Creatinine, Ser  ?Date Value Ref Range Status  ?10/09/2021 1.16 0.61 - 1.24 mg/dL Final  ?   ?  ?  Passed - HBA1C is between 0 and 7.9 and within 180 days  ?  Hgb A1c MFr Bld  ?Date Value Ref Range Status  ?06/08/2021 5.6 <5.7 % of total Hgb Final  ?  Comment:  ?  For the purpose of screening for the presence of ?diabetes: ?. ?<5.7%       Consistent with the absence of diabetes ?5.7-6.4%    Consistent with increased risk for diabetes ?            (prediabetes) ?> or =6.5%  Consistent with diabetes ?Marland Kitchen ?This assay result is consistent with a decreased risk ?of diabetes. ?. ?Currently, no consensus exists regarding use of ?hemoglobin A1c for diagnosis of diabetes in children. ?. ?According to American Diabetes Association (ADA) ?guidelines, hemoglobin A1c <7.0% represents optimal ?control in non-pregnant diabetic patients. Different ?metrics may apply to specific patient populations.  ?Standards of Medical Care in Diabetes(ADA). ?. ?  ?   ?  ?  Passed - eGFR in normal range and within 360 days  ?  GFR, Est African American  ?Date Value Ref Range Status  ?09/08/2020 104 > OR = 60 mL/min/1.39m Final  ? ?GFR, Est Non African American  ?Date Value Ref Range Status  ?09/08/2020 89 > OR = 60 mL/min/1.762mFinal  ? ?GFR, Estimated  ?Date Value Ref Range Status  ?10/09/2021 >60 >60 mL/min Final  ?  Comment:  ?  (NOTE) ?Calculated  using the CKD-EPI Creatinine Equation (2021) ?  ? ?eGFR  ?Date Value Ref Range Status  ?09/13/2021 83 > OR = 60 mL/min/1.82m Final  ?  Comment:  ?  The eGFR is based on the CKD-EPI 2021 equation. To calculate  ?the new eGFR from a previous Creatinine or Cystatin C ?result, go to https://www.kidney.org/professionals/ ?kdoqi/gfr%5Fcalculator ?  ?   ?  ?  Passed - Valid encounter within last 6 months  ?  Recent Outpatient Visits   ?      ? 3 weeks ago DM (diabetes mellitus), type 2 with neurological complications ([place]  ? [place] DO  ? 4 months ago Annual physical exam  ? [place] DO  ? 1 year ago DM (diabetes mellitus), type 2 with neurological complications ([facility]  ? [place] DO  ? 1 year ago Annual physical exam  ? [place] DO  ? 1 year ago DM (diabetes mellitus), type 2 with neurological complications ([facility]  ? SSikes DO  ?  ?  ?Future Appointments   ?        ? [facility] ? In 8 months [name], [place]onda Fairly MD [place] ? In 8 months KParks Ranger ADevonne Doughty DO [facility] [place] ?  ? ?  ?  ?  ? ? ?

## 2021-11-02 ENCOUNTER — Ambulatory Visit
Admission: RE | Admit: 2021-11-02 | Discharge: 2021-11-02 | Disposition: A | Discharge status: Home / Self Care | Payer: Medicare HMO | Source: Ambulatory Visit | Attending: Oncology | Admitting: Oncology

## 2021-11-02 DIAGNOSIS — K8020 Calculus of gallbladder without cholecystitis without obstruction: Secondary | ICD-10-CM | POA: Diagnosis not present

## 2021-11-02 DIAGNOSIS — D696 Thrombocytopenia, unspecified: Secondary | ICD-10-CM | POA: Diagnosis not present

## 2021-11-06 ENCOUNTER — Inpatient Hospital Stay: Payer: Medicare HMO | Attending: Oncology | Admitting: Oncology

## 2021-11-06 ENCOUNTER — Encounter: Payer: Self-pay | Admitting: Oncology

## 2021-11-06 VITALS — BP 142/71 | HR 71 | Temp 97.7°F | Wt 181.0 lb

## 2021-11-06 DIAGNOSIS — Z79899 Other long term (current) drug therapy: Secondary | ICD-10-CM | POA: Insufficient documentation

## 2021-11-06 DIAGNOSIS — D72818 Other decreased white blood cell count: Secondary | ICD-10-CM

## 2021-11-06 DIAGNOSIS — Z801 Family history of malignant neoplasm of trachea, bronchus and lung: Secondary | ICD-10-CM | POA: Insufficient documentation

## 2021-11-06 DIAGNOSIS — Z7984 Long term (current) use of oral hypoglycemic drugs: Secondary | ICD-10-CM | POA: Insufficient documentation

## 2021-11-06 DIAGNOSIS — D696 Thrombocytopenia, unspecified: Secondary | ICD-10-CM | POA: Diagnosis not present

## 2021-11-06 DIAGNOSIS — D509 Iron deficiency anemia, unspecified: Secondary | ICD-10-CM | POA: Insufficient documentation

## 2021-11-06 DIAGNOSIS — R161 Splenomegaly, not elsewhere classified: Secondary | ICD-10-CM | POA: Diagnosis not present

## 2021-11-06 DIAGNOSIS — K219 Gastro-esophageal reflux disease without esophagitis: Secondary | ICD-10-CM | POA: Diagnosis not present

## 2021-11-06 DIAGNOSIS — Z7982 Long term (current) use of aspirin: Secondary | ICD-10-CM | POA: Diagnosis not present

## 2021-11-06 DIAGNOSIS — Z87891 Personal history of nicotine dependence: Secondary | ICD-10-CM | POA: Diagnosis not present

## 2021-11-06 DIAGNOSIS — K8020 Calculus of gallbladder without cholecystitis without obstruction: Secondary | ICD-10-CM | POA: Insufficient documentation

## 2021-11-06 DIAGNOSIS — E785 Hyperlipidemia, unspecified: Secondary | ICD-10-CM | POA: Insufficient documentation

## 2021-11-06 DIAGNOSIS — Z789 Other specified health status: Secondary | ICD-10-CM | POA: Diagnosis not present

## 2021-11-06 DIAGNOSIS — D61818 Other pancytopenia: Secondary | ICD-10-CM | POA: Insufficient documentation

## 2021-11-06 DIAGNOSIS — E119 Type 2 diabetes mellitus without complications: Secondary | ICD-10-CM | POA: Diagnosis not present

## 2021-11-06 DIAGNOSIS — R69 Illness, unspecified: Secondary | ICD-10-CM | POA: Diagnosis not present

## 2021-11-06 DIAGNOSIS — K700 Alcoholic fatty liver: Secondary | ICD-10-CM | POA: Diagnosis not present

## 2021-11-06 NOTE — Progress Notes (Signed)
?Hematology/Oncology Progress note ?[phone] [phone] ?  ? ?  ? ?   ? ? ?Patient Care Team: ?[name], DO as PCP - General (Family Medicine) ?Earlie Server, MD as Consulting Physician (Hematology and Oncology) ? ?REFERRING PROVIDER: ?Nobie Putnam *  ?CHIEF COMPLAINTS/REASON FOR VISIT:  ?Evaluation of pancytopenia ? ?HISTORY OF PRESENTING ILLNESS:  ? ?Richard WALKOWSKI Sr. is a  66 y.o.  male with PMH listed below was seen in consultation at the request of  Nobie Putnam *  for evaluation of pancytopenia ? ?Patient has had some blood work done obtained by primary care provider. ?09/13/2021, WBC 3, differential showed decreased absolute lymphocyte 0.7.  Thrombocytopenia 122,000.  Normal hemoglobin 14.1. ?09/13/2021, pathology smear showed pancytopenia.  No platelet counts.  Myeloid population consist predominantly of mature segmented neutrophils.  Rare atypical lymphocytes.  No immature cells are identified.  Anemia with RBCs which appear to be borderline microcytic and hypochromic on smear review. ?Reviewed previous lab records ?WBC was 2.8 in December 2022, platelet count 124 at that time ?Patient had normal platelet count of 153 on 06/08/2020. ? ?Patient drinks 12 cans of beer daily.  He used to drink hard liquor and has quitting many years ago.   ?Patient recently had started vitamin B12 supplementation, iron supplementation. ? ? ?INTERVAL HISTORY ?Richard Render Viviani Sr. is a 66 y.o. male who has above history reviewed by me today presents for follow up visit for management of thrombocytopenia and leukopenia. ?Patient has been trying to cut down alcohol supplementation.  Previously 12 cans of beers daily, currently 6-8 beers daily.  Patient takes vitamin B12 supplementation.  He has no new complaints. ? ? ?Review of Systems  ?Constitutional:  Negative for appetite change, chills, fatigue, fever and unexpected weight change.  ?HENT:   Negative for hearing loss and voice  change.   ?Eyes:  Negative for eye problems and icterus.  ?Respiratory:  Negative for chest tightness, cough and shortness of breath.   ?Cardiovascular:  Negative for chest pain and leg swelling.  ?Gastrointestinal:  Negative for abdominal distention and abdominal pain.  ?Endocrine: Negative for hot flashes.  ?Genitourinary:  Negative for difficulty urinating, dysuria and frequency.   ?Musculoskeletal:  Negative for arthralgias.  ?Skin:  Negative for itching and rash.  ?Neurological:  Negative for light-headedness and numbness.  ?Hematological:  Negative for adenopathy. Does not bruise/bleed easily.  ?Psychiatric/Behavioral:  Negative for confusion.   ? ?MEDICAL HISTORY:  ?Past Medical History:  ?Diagnosis Date  ? Anxiety   ? Cholelithiasis   ? DM (diabetes mellitus), type 2 with neurological complications ([place])   ? GERD (gastroesophageal reflux disease)   ? Hyperlipidemia   ? Pancytopenia ([place])   ? Seasonal allergic rhinitis due to other allergic trigger   ? ? ?SURGICAL HISTORY: ?Past Surgical History:  ?Procedure Laterality Date  ? Lung Collapse  66 years old.   ? ? ?SOCIAL HISTORY: ?Social History  ? ?Socioeconomic History  ? Marital status: Married  ?  Spouse name: Not on file  ? Number of children: Not on file  ? Years of education: Not on file  ? Highest education level: Not on file  ?Occupational History  ? Not on file  ?Tobacco Use  ? Smoking status: Former  ?  Years: 10.00  ?  Types: Cigarettes  ?  Quit date: 08/08/1999  ?  Years since quitting: 22.2  ? Smokeless tobacco: Former  ?Vaping Use  ? Vaping Use: Never used  ?Substance and  Sexual Activity  ? Alcohol use: No  ?  Alcohol/week: 0.0 standard drinks  ?  Comment: Has requested Antabuse at previous PCP visit.   ? Drug use: No  ? Sexual activity: Yes  ?Other Topics Concern  ? Not on file  ?Social History Narrative  ? Not on file  ? ?Social Determinants of Health  ? ?Financial Resource Strain: Low Risk   ? Difficulty of Paying Living Expenses: Not hard at  all  ?Food Insecurity: No Food Insecurity  ? Worried About Charity fundraiser in the Last Year: Never true  ? Ran Out of Food in the Last Year: Never true  ?Transportation Needs: No Transportation Needs  ? Lack of Transportation (Medical): No  ? Lack of Transportation (Non-Medical): No  ?Physical Activity: Sufficiently Active  ? Days of Exercise per Week: 7 days  ? Minutes of Exercise per Session: 30 min  ?Stress: No Stress Concern Present  ? Feeling of Stress : Not at all  ?Social Connections: Not on file  ?Intimate Partner Violence: Not on file  ? ? ?FAMILY HISTORY: ?Family History  ?Problem Relation Age of Onset  ? Diabetes Father   ?     complications of DM caused death  ? Heart attack Father   ? Diabetes Sister   ? Cancer Mother   ?     lung  ? Lung cancer Mother   ? ? ?ALLERGIES:  is allergic to bee venom. ? ?MEDICATIONS:  ?Current Outpatient Medications  ?Medication Sig Dispense Refill  ? allopurinol (ZYLOPRIM) 300 MG tablet Take 1 tablet (300 mg total) by mouth daily. 90 tablet 3  ? azelastine (ASTELIN) 0.1 % nasal spray Place 1 spray into both nostrils 2 (two) times daily. Use in each nostril as directed 30 mL 12  ? benazepril (LOTENSIN) 40 MG tablet TAKE 1 TABLET BY MOUTH DAILY AT 6 IN THE MORNING. 90 tablet 1  ? Blood Glucose Calibration (OT ULTRA/FASTTK CNTRL SOLN) SOLN Use as instructed to check blood sugars twice a day. 1 each 5  ? Blood Glucose Monitoring Suppl (ONE TOUCH ULTRA 2) w/Device KIT Use as instructed to check blood sugars twice a day. 1 kit 0  ? gabapentin (NEURONTIN) 100 MG capsule TAKE ONE CAPSULE BY MOUTH THREE TIMES A DAY 270 capsule 2  ? Iron, Ferrous Sulfate, 325 (65 Fe) MG TABS     ? ketoconazole (NIZORAL) 2 % shampoo Apply 1 application topically 2 (two) times a week. As needed for scalp dermatitis 120 mL 0  ? Lancets Misc. (ONE TOUCH SURESOFT) MISC Use as instructed to check blood sugars twice a day. 100 each 12  ? Magnesium Gluconate 500 (27 MG) MG TABS Take 1 tablet by mouth  every evening.     ? melatonin 5 MG TABS Take 1 tablet by mouth at bedtime.     ? metFORMIN (GLUCOPHAGE) 500 MG tablet Take 1 tablet (500 mg total) by mouth 2 (two) times daily with a meal. 180 tablet 3  ? MULTIPLE VITAMIN PO Take by mouth.    ? ONETOUCH ULTRA test strip Use as instructed to check blood sugars twice a day. 100 each 12  ? OZEMPIC, 1 MG/DOSE, 4 MG/3ML SOPN Inject 1 mg into the skin once a week. 3 mL 0  ? rosuvastatin (CRESTOR) 5 MG tablet TAKE ONE TABLET BY MOUTH EVERY NIGHT AT BEDTIME 90 tablet 3  ? saw palmetto 160 MG capsule Take 1 capsule (160 mg total) by mouth 2 (two)  times daily.    ? sertraline (ZOLOFT) 50 MG tablet TAKE ONE TABLET BY MOUTH DAILY 90 tablet 3  ? sildenafil (REVATIO) 20 MG tablet Take 3-5 tablets daily by mouth as needed 90 tablet 1  ? tamsulosin (FLOMAX) 0.4 MG CAPS capsule Take 2 capsules (0.8 mg total) by mouth daily. 180 capsule 3  ? traZODone (DESYREL) 50 MG tablet TAKE 1.5 TABLET BY MOUTH EVERY NIGHT AT BEDTIME 135 tablet 2  ? vitamin B-12 (CYANOCOBALAMIN) 1000 MCG tablet Take by mouth.    ? zinc gluconate 50 MG tablet Take 50 mg by mouth daily.    ? aspirin 81 MG tablet Take 1 tablet by mouth daily at 6 (six) AM.    ? famotidine (PEPCID) 20 MG tablet Take 20 mg by mouth in the morning.    ? ?No current facility-administered medications for this visit.  ? ? ? ?PHYSICAL EXAMINATION: ?ECOG PERFORMANCE STATUS: 0 - Asymptomatic ?Vitals:  ? 11/06/21 1001  ?BP: (!) 142/71  ?Pulse: 71  ?Temp: 97.7 ?F (36.5 ?C)  ? ?Filed Weights  ? 11/06/21 1001  ?Weight: 181 lb (82.1 kg)  ? ? ?Physical Exam ?Constitutional:   ?   General: He is not in acute distress. ?HENT:  ?   Head: Normocephalic and atraumatic.  ?Eyes:  ?   General: No scleral icterus. ?Cardiovascular:  ?   Rate and Rhythm: Normal rate and regular rhythm.  ?   Heart sounds: Normal heart sounds.  ?Pulmonary:  ?   Effort: Pulmonary effort is normal. No respiratory distress.  ?   Breath sounds: No wheezing.  ?Abdominal:  ?    General: Bowel sounds are normal. There is no distension.  ?   Palpations: Abdomen is soft.  ?Musculoskeletal:     ?   General: No deformity. Normal range of motion.  ?   Cervical back: Normal range of motion

## 2021-11-22 ENCOUNTER — Ambulatory Visit (INDEPENDENT_AMBULATORY_CARE_PROVIDER_SITE_OTHER): Payer: Medicare HMO | Admitting: Pharmacist

## 2021-11-22 DIAGNOSIS — E1149 Type 2 diabetes mellitus with other diabetic neurological complication: Secondary | ICD-10-CM

## 2021-11-22 NOTE — Patient Instructions (Signed)
Visit Information ? ?Thank you for taking time to visit with me today. Please don't hesitate to contact me if I can be of assistance to you before our next scheduled telephone appointment. ? ?Following are the goals we discussed today:  ? Goals Addressed   ? ?  ?  ?  ?  ? This Visit's Progress  ?  Pharmacy Goals     ?  Our goal A1c is less than 7%. This corresponds with fasting sugars less than 130 and 2 hour after meal sugars less than 180. Please continue keep a log of results when you check your blood sugar at home. ? ?Feel free to call me with any questions or concerns. I look forward to our next call! ? ? ?Wallace Cullens, PharmD, BCACP ?Clinical Pharmacist ?[facility] ?[place] ?[phone] ?  ? ?  ? ? ? ?Our next appointment is by telephone on 12/6 at 11:15 am ? ?Please call the care guide team at [phone] if you need to cancel or reschedule your appointment.  ? ? ?Patient verbalizes understanding of instructions and care plan provided today and agrees to view in [place]. Active MyChart status and patient understanding of how to access instructions and care plan via MyChart confirmed with patient.    ? ?

## 2021-11-22 NOTE — Chronic Care Management (AMB) (Signed)
? ?Chronic Care Management ?CCM Pharmacy Note ? ?11/22/2021 ?Name:  Richard GILDNER Sr. MRN:  [id number] DOB:  July 30, 1955 ? ? ?Subjective: ?Richard ROUPP Sr. is an 66 y.o. year old male who is Day primary patient of Richard Day.  The CCM team was consulted for assistance with disease management and care coordination needs.   ? ?Receive Day call from patient requesting Day call back. ? ?Engaged with patient by telephone for follow up visit for pharmacy case management and/or care coordination services.  ? ?Objective: ? ?Medications Reviewed Today   ? ? Reviewed by Richard Day, RPH-CPP (Pharmacist) on 11/22/21 at 69  Med List Status: <None>  ? ?Medication Order Taking? Sig Documenting Richard Day Last Dose Status Informant  ?allopurinol (ZYLOPRIM) 300 MG tablet [id number] Yes Take 1 tablet (300 mg total) by mouth daily. Richard Day Taking Active   ?azelastine (ASTELIN) 0.1 % nasal spray [id number] Yes Place 1 spray into both nostrils 2 (two) times daily. Use in each nostril as directed Richard Day Taking Active   ?benazepril (LOTENSIN) 40 MG tablet [id number] Yes TAKE 1 TABLET BY MOUTH DAILY AT 6 IN THE MORNING. Richard Day Taking Active   ?Blood Glucose Calibration (OT ULTRA/FASTTK CNTRL SOLN) SOLN [id number]  Use as instructed to check blood sugars twice Day day. Richard Day  Active   ?Blood Glucose Monitoring Suppl (ONE TOUCH ULTRA 2) w/Device KIT [id number]  Use as instructed to check blood sugars twice Day day. Richard Day  Active   ?gabapentin (NEURONTIN) 100 MG capsule [id number] Yes TAKE ONE CAPSULE BY MOUTH THREE TIMES Day DAY Richard Day Taking Active   ?ketoconazole (NIZORAL) 2 % shampoo [id number] Yes Apply 1 application topically 2 (two) times Day week. As needed for scalp dermatitis Richard Day Taking Active   ?Lancets Misc. (ONE TOUCH SURESOFT) MISC [id number]  Use as instructed to check blood  sugars twice Day day. Richard Day  Active   ?Magnesium Gluconate 500 (27 MG) MG TABS [id number] Yes Take 1 tablet by mouth every evening.  Richard Day Taking Active   ?         ?Med Note (Richard Day   Sat Nov 06, 2014 11:01 AM) Received from: Richard Day  ?melatonin 5 MG TABS [id number] Yes Take 1 tablet by mouth at bedtime.  Richard Day, Historical, Day Taking Active   ?MULTIPLE VITAMIN PO [id number] Yes Take by mouth. Richard Day Taking Active   ?         ?Med Note (Richard Day   Sat Nov 06, 2014 11:01 AM) Received from: Richard Day  ?ONETOUCH ULTRA test strip [id number]  Use as instructed to check blood sugars twice Day day. Richard Day  Active   ?OZEMPIC, 1 MG/DOSE, 4 MG/3ML SOPN [id number] Yes Inject 1 mg into the skin once Day week. Richard Day Taking Active   ?rosuvastatin (CRESTOR) 5 MG tablet [id number] Yes TAKE ONE TABLET BY MOUTH EVERY NIGHT AT BEDTIME Richard Day Taking Active   ?saw palmetto 160 MG capsule [id number] Yes Take 1 capsule (160 mg total) by mouth 2 (two) times daily. Richard Day Taking Active   ?sertraline (ZOLOFT) 50 MG tablet [id number] Yes TAKE ONE TABLET BY MOUTH DAILY Richard Day Taking Active   ?sildenafil (REVATIO) 20 MG tablet [id number] Yes Take 3-5  tablets daily by mouth as needed Richard Day Taking Active   ?tamsulosin (FLOMAX) 0.4 MG CAPS capsule [id number] Yes Take 2 capsules (0.8 mg total) by mouth daily. Richard Day Taking Active   ?traZODone (DESYREL) 50 MG tablet [id number] Yes TAKE 1.5 TABLET BY MOUTH EVERY NIGHT AT BEDTIME Richard Day Taking Active   ?vitamin B-12 (CYANOCOBALAMIN) 1000 MCG tablet [id number] Yes Take by mouth. Richard Day, Historical, Day Taking Active   ?zinc gluconate 50 MG tablet [id number] Yes Take 50 mg by mouth daily. Richard Day, Historical, Day Taking Active   ? ?  ?  ? ?   ? ? ?Pertinent Labs:  ?Lab Results  ?Component Value Date  ? HGBA1C 5.6 06/08/2021  ? ?Lab Results  ?Component Value Date  ? CHOL 150 06/08/2021  ? HDL 66 06/08/2021  ? Richard Day 71 06/08/2021  ? TRIG 54 06/08/2021  ? CHOLHDL 2.3 06/08/2021  ? ?Lab Results  ?Component Value Date  ? CREATININE 1.16 10/09/2021  ? BUN 18 10/09/2021  ? NA 136 10/09/2021  ? K 4.6 10/09/2021  ? CL 102 10/09/2021  ? CO2 27 10/09/2021  ? ? ?SDOH:  (Social Determinants of Health) assessments and interventions performed:  ? ? ?CCM Care Plan ? ?Review of patient past medical history, allergies, medications, health status, including review of consultants reports, laboratory and other test data, was performed as part of comprehensive evaluation and provision of chronic care management services.  ? ?Care Plan : PharmD - Medication Assistance  ?Updates made by Richard Day, RPH-CPP since 11/22/2021 12:00 AM  ?  ? ?Problem: Disease Progression   ?  ? ?Long-Range Goal: Disease Progression Prevented or Minimized   ?Start Date: 08/17/2020  ?Expected End Date: 10/16/2020  ?Recent Progress: On track  ?Priority: High  ?Note:   ?Current Barriers:  ?Financial Barriers in complicated patient with multiple medical conditions including HTN, T2DM, HLD, BPH and depression; patient has [employer] and reports copay for Ozempic is cost prohibitive at this time ?Patient APPROVED for patient assistance for Ozempic from [employer] for 2022 calendar year ? ?Pharmacist Clinical Goal(s):  ?Over the next 90 days, patient will maintain control of blood sugar as evidenced by A1C <7%  through collaboration with PharmD and Richard Day.  ? ?Interventions: ?1:1 collaboration with Richard Day regarding development and update of comprehensive plan of care as evidenced by Richard Day attestation and co-signature ?Inter-disciplinary care team collaboration (see longitudinal plan of care) ?Today receive Day call from patient requesting Day  call back. Return call to patient ?Perform chart review ?Patient seen for Office Visit with PCP on 3/15 for follow up of T2DM. Richard Day advised patient: ?Keep trying to reduce Metformin in future. Down to 553m once daily with meal for period of time, then if doing well, can completely come off of the metformin, and keep Ozempic. ?[facility] of [facility] ?Stop other allergy spray Oxyzmetazoline  ?[facility] ?[facility]  ?Richard Day encouraged alcohol cessation ?Today confirms discontinued use of oxymetazoline ? ?Type 2 Diabetes ?Controlled; current treatment: ?Ozempic 1 mg weekly ?Reports has titrated himself off of metformin as discussed with PCP ?Reports recent fasting blood sugar readings have increased, currently fasting morning blood sugars average ~140, ranging 120-172 ?Current dietary habits: breakfast: couple of boiled eggs; lunch: salad or burger; supper: casseroles recently; dessert at night: little Debbie, chips, pretzels,  nuts; drinks: beer 6-8 cans/day, coffee in morning ?Counsel on impact of alcohol consumption on blood sugar control ?Reports since restarted drinking, has now cut back from ~12 cans of beer/day to 6-8 cans/day ?Encourage patient to continue cut back on alcohol consumption with Day goal of totally stop drinking ?Declined need for further support at this time ?Discuss importance of having regular well-balanced meals, while controlling carbohydrate portion sizes ?Encourage patient to continue to monitor home blood sugar, keep log of results and have this record to review at medical appointments ? ? ? ?Patient Goals/Self-Care Activities ?Over the next 90 days, patient will:  ?- take medications as prescribed ?- check glucose, document, and provide at future appointments ?- check blood pressure, document, and provide at future  appointments ?- collaborate with Richard Day on medication access solutions ?- attend medical appointment as scheduled ? ?Follow Up Plan:  ?Telephone follow up appointment with care management team member s

## 2021-11-27 ENCOUNTER — Encounter: Payer: Self-pay | Admitting: Urology

## 2021-12-06 DIAGNOSIS — E1169 Type 2 diabetes mellitus with other specified complication: Secondary | ICD-10-CM

## 2022-01-16 ENCOUNTER — Other Ambulatory Visit: Payer: Self-pay

## 2022-01-16 DIAGNOSIS — I10 Essential (primary) hypertension: Secondary | ICD-10-CM

## 2022-01-16 MED ORDER — BENAZEPRIL HCL 40 MG PO TABS: ORAL_TABLET | ORAL | 1 refills | Status: DC

## 2022-02-05 ENCOUNTER — Other Ambulatory Visit: Payer: Self-pay | Admitting: Urology

## 2022-02-05 ENCOUNTER — Other Ambulatory Visit: Payer: Self-pay | Admitting: Family Medicine

## 2022-02-05 DIAGNOSIS — F339 Major depressive disorder, recurrent, unspecified: Secondary | ICD-10-CM

## 2022-02-05 DIAGNOSIS — N5201 Erectile dysfunction due to arterial insufficiency: Secondary | ICD-10-CM

## 2022-02-05 DIAGNOSIS — N401 Enlarged prostate with lower urinary tract symptoms: Secondary | ICD-10-CM

## 2022-02-06 DIAGNOSIS — M7918 Myalgia, other site: Secondary | ICD-10-CM | POA: Diagnosis not present

## 2022-02-06 DIAGNOSIS — M5137 Other intervertebral disc degeneration, lumbosacral region: Secondary | ICD-10-CM | POA: Diagnosis not present

## 2022-02-06 DIAGNOSIS — M5432 Sciatica, left side: Secondary | ICD-10-CM | POA: Diagnosis not present

## 2022-02-06 DIAGNOSIS — M9903 Segmental and somatic dysfunction of lumbar region: Secondary | ICD-10-CM | POA: Diagnosis not present

## 2022-02-06 DIAGNOSIS — M9904 Segmental and somatic dysfunction of sacral region: Secondary | ICD-10-CM | POA: Diagnosis not present

## 2022-02-06 DIAGNOSIS — M5136 Other intervertebral disc degeneration, lumbar region: Secondary | ICD-10-CM | POA: Diagnosis not present

## 2022-02-06 DIAGNOSIS — E113393 Type 2 diabetes mellitus with moderate nonproliferative diabetic retinopathy without macular edema, bilateral: Secondary | ICD-10-CM | POA: Diagnosis not present

## 2022-02-06 DIAGNOSIS — H524 Presbyopia: Secondary | ICD-10-CM | POA: Diagnosis not present

## 2022-02-06 LAB — HM DIABETES EYE EXAM

## 2022-02-06 NOTE — Telephone Encounter (Signed)
Requested Prescriptions  Pending Prescriptions Disp Refills  . sertraline (ZOLOFT) 50 MG tablet [Pharmacy Med Name: SERTRALINE HCL 50 MG TABLET] 90 tablet 0    Sig: TAKE ONE TABLET BY MOUTH DAILY     Psychiatry:  Antidepressants - SSRI - sertraline Failed - 02/05/2022  1:27 PM      Failed - AST in normal range and within 360 days    AST  Date Value Ref Range Status  10/09/2021 56 (H) 15 - 41 U/L Final         Passed - ALT in normal range and within 360 days    ALT  Date Value Ref Range Status  10/09/2021 44 0 - 44 U/L Final         Passed - Completed PHQ-2 or PHQ-9 in the last 360 days      Passed - Valid encounter within last 6 months    Recent Outpatient Visits          4 months ago DM (diabetes mellitus), type 2 with neurological complications [facility])   [place], DO   7 months ago Annual physical exam   [place], [place]   1 year ago DM (diabetes mellitus), type 2 with neurological complications [facility])   [place], DO   1 year ago Annual physical exam   [facility] [name], DO   2 years ago DM (diabetes mellitus), type 2 with neurological complications [facility])   [place], DO      Future Appointments            In 4 weeks  [facility], [place]   In 4 months [place], [name], MD [place]   In 4 months [place], [name], [facility], [facility]

## 2022-03-05 ENCOUNTER — Ambulatory Visit: Payer: Medicare HMO | Admitting: Pharmacist

## 2022-03-05 DIAGNOSIS — E1149 Type 2 diabetes mellitus with other diabetic neurological complication: Secondary | ICD-10-CM

## 2022-03-05 NOTE — Patient Instructions (Signed)
Visit Information  Thank you for taking time to visit with me today. Please don't hesitate to contact me if I can be of assistance to you before our next scheduled telephone appointment.  Following are the goals we discussed today:   Goals Addressed             This Visit's Progress    Pharmacy Goals       Our goal A1c is less than 7%. This corresponds with fasting sugars less than 130 and 2 hour after meal sugars less than 180. Please continue keep a log of results when you check your blood sugar at home.  Feel free to call me with any questions or concerns. I look forward to our next call!   Wallace Cullens, PharmD, [place] [phone]         Our next appointment is by telephone on 06/13/2022 at 11:15 AM  Please call the care guide team at [phone] if you need to cancel or reschedule your appointment.   Patient verbalizes understanding of instructions and care plan provided today and agrees to view in [place]. Active MyChart status and patient understanding of how to access instructions and care plan via MyChart confirmed with patient.

## 2022-03-05 NOTE — Chronic Care Management (AMB) (Signed)
Chronic Care Management CCM Pharmacy Note  03/05/2022 Name:  Richard MCGIBBON Sr. MRN:  [id number] DOB:  [birth year]   Subjective: Richard Render Moyano Sr. is an 66 y.o. year old male who is a primary patient of [name], DO.  The CCM team was consulted for assistance with disease management and care coordination needs.    Engaged with patient by telephone for follow up visit for pharmacy case management and/or care coordination services.   Objective:  Medications Reviewed Today     Reviewed by Rennis Petty, RPH-CPP (Pharmacist) on 11/22/21 at 30  Med List Status: <None>   Medication Order Taking? Sig Documenting Provider Last Dose Status Informant  allopurinol (ZYLOPRIM) 300 MG tablet [id number] Yes Take 1 tablet (300 mg total) by mouth daily. [name], MD Taking Active   azelastine (ASTELIN) 0.1 % nasal spray [id number] Yes Place 1 spray into both nostrils 2 (two) times daily. Use in each nostril as directed [name], DO Taking Active   benazepril (LOTENSIN) 40 MG tablet [id number] Yes TAKE 1 TABLET BY MOUTH DAILY AT 6 IN THE MORNING. [name], DO Taking Active   Blood Glucose Calibration (OT ULTRA/FASTTK CNTRL SOLN) SOLN [id number]  Use as instructed to check blood sugars twice a day. [name], [name], DO  Active   Blood Glucose Monitoring Suppl (ONE TOUCH ULTRA 2) w/Device KIT [id number]  Use as instructed to check blood sugars twice a day. [name], DO  Active   gabapentin (NEURONTIN) 100 MG capsule [id number] Yes TAKE ONE CAPSULE BY MOUTH THREE TIMES A DAY [name], Alexander Lenna Sciara, DO Taking Active   ketoconazole (NIZORAL) 2 % shampoo [id number] Yes Apply 1 application topically 2 (two) times a week. As needed for scalp dermatitis [place], [name], DO Taking Active   Lancets Misc. ([place]) MISC [id number]  Use as instructed to check blood sugars twice a day. [name], DO   Active   Magnesium Gluconate 500 (27 MG) MG TABS [id number] Yes Take 1 tablet by mouth every evening.  [name], MD Taking Active            Med Note [facility], [name]   Sat Nov 06, 2014 11:01 AM) Received from: [employer]  melatonin 5 MG TABS [id number] Yes Take 1 tablet by mouth at bedtime.  [provider] Taking Active   MULTIPLE VITAMIN PO [id number] Yes Take by mouth. [name], MD Taking Active            Med Note [facility], [name]   Sat Nov 06, 2014 11:01 AM) Received from: [place] test strip [id number]  Use as instructed to check blood sugars twice a day. [name], DO  Active   OZEMPIC, 1 MG/DOSE, 4 MG/3ML Bon[place] Aid [id number] Yes Inject 1 mg into the skin once a week. [name], DO Taking Active   rosuvastatin (CRESTOR) 5 MG tablet [id number] Yes TAKE ONE TABLET BY MOUTH EVERY NIGHT AT BEDTIME [name], DO Taking Active   saw palmetto 160 MG capsule [id number] Yes Take 1 capsule (160 mg total) by mouth 2 (two) times daily. [name], DO Taking Active   sertraline (ZOLOFT) 50 MG tablet [id number] Yes TAKE ONE TABLET BY MOUTH DAILY [place] [name], DO Taking Active   sildenafil (REVATIO) 20 MG tablet [id number] Yes Take 3-5 tablets daily by mouth as needed [name], [name], MD  Taking Active   tamsulosin (FLOMAX) 0.4 MG CAPS capsule [id number] Yes Take 2 capsules (0.8 mg total) by mouth daily. [name], MD Taking Active   traZODone (DESYREL) 50 MG tablet [id number] Yes TAKE 1.5 TABLET BY MOUTH EVERY NIGHT AT BEDTIME [name], DO Taking Active   vitamin B-12 (CYANOCOBALAMIN) 1000 MCG tablet [id number] Yes Take by mouth. [provider] Taking Active   zinc gluconate 50 MG tablet [id number] Yes Take 50 mg by mouth daily. [provider] Taking Active             Pertinent Labs:  Lab Results  Component  Value Date   HGBA1C 5.6 06/08/2021   Lab Results  Component Value Date   CHOL 150 06/08/2021   HDL 66 06/08/2021   LDLCALC 71 06/08/2021   TRIG 54 06/08/2021   CHOLHDL 2.3 06/08/2021   Lab Results  Component Value Date   CREATININE 1.16 10/09/2021   BUN 18 10/09/2021   NA 136 10/09/2021   K 4.6 10/09/2021   CL 102 10/09/2021   CO2 27 10/09/2021    SDOH:  (Social Determinants of Health) assessments and interventions performed:    [place]  Review of patient past medical history, allergies, medications, health status, including review of consultants reports, laboratory and other test data, was performed as part of comprehensive evaluation and provision of chronic care management services.   Care Plan : PharmD - Medication Assistance  Updates made by Rennis Petty, RPH-CPP since 03/05/2022 12:00 AM     Problem: Disease Progression      Long-Range Goal: Disease Progression Prevented or Minimized   Start Date: 08/17/2020  Expected End Date: 10/16/2020  Recent Progress: On track  Priority: High  Note:   Current Barriers:  Financial Barriers in complicated patient with multiple medical conditions including HTN, T2DM, HLD, BPH and depression; patient has [employer] and reports copay for Ozempic is cost prohibitive at this time Patient APPROVED for patient assistance for [employer] from [employer] for 2022 calendar year  Pharmacist Clinical Goal(s):  Over the next 90 days, patient will maintain control of blood sugar as evidenced by A1C <7%  through collaboration with PharmD and provider.   Interventions: 1:1 collaboration with [name], DO regarding development and update of comprehensive plan of care as evidenced by provider attestation and co-signature Inter-disciplinary care team collaboration (see longitudinal plan of care) Today receive a call from patient requesting a call back. Return call to patient  Medication  Assistance: Patient reports he has 7 week supply of his Ozempic 1 mg remaining Provide patient with phone number for [employer] patient assistance program. Advise him to follow up with program if has not received notification of an auto-shipment refill receipt at office when has a 1 month supply remaining  Type 2 Diabetes Controlled; current treatment: Ozempic 1 mg weekly Reports recent fasting blood sugar readings primarily ranging 105-134 Attributes some higher readings to when he has increased snacking during the day Counsel patient on low carbohydrate snack options Again discuss impact of alcohol consumption on blood sugar control Reports since restarted drinking, cut back from ~12 cans of beer/day to currently drinking ~6 cans/day Encourage patient to continue cut back on alcohol consumption with a goal of totally stop drinking Declined need for further support at this time Discuss importance of having regular well-balanced meals, while controlling carbohydrate portion sizes Encourage patient to continue to monitor home blood sugar, keep log  of results and have this record to review at medical appointments  Hyperlipidemia: Controlled; rosuvastatin 5 mg daily   Patient Goals/Self-Care Activities Over the next 90 days, patient will:  - take medications as prescribed - check glucose, document, and provide at future appointments - check blood pressure, document, and provide at future appointments - collaborate with provider on medication access solutions - attend medical appointment as scheduled  Follow Up Plan:  Telephone follow up appointment with care management team member scheduled for: 06/13/2022 at 11:15 AM        Wallace Cullens, PharmD, [place], [place] [phone]

## 2022-03-06 ENCOUNTER — Ambulatory Visit: Payer: Medicare HMO

## 2022-03-06 ENCOUNTER — Ambulatory Visit (INDEPENDENT_AMBULATORY_CARE_PROVIDER_SITE_OTHER): Payer: Medicare HMO

## 2022-03-06 VITALS — BP 120/54 | HR 88 | Temp 98.5°F | Ht 70.0 in | Wt 187.0 lb

## 2022-03-06 DIAGNOSIS — Z23 Encounter for immunization: Secondary | ICD-10-CM | POA: Diagnosis not present

## 2022-03-06 DIAGNOSIS — Z0000 Encounter for general adult medical examination without abnormal findings: Secondary | ICD-10-CM

## 2022-03-06 NOTE — Progress Notes (Signed)
Subjective:   Richard ROOTS Sr. is a 66 y.o. male who presents for Medicare Annual/Subsequent preventive examination.  Review of Systems    Per HPI unless specifically indicated above   Cardiac Risk Factors include: advanced age (>80mn, >>45women);male gender, hypertension and diabetes.           Objective:    Today's Vitals   03/06/22 0801  BP: (!) 120/54  Pulse: 88  Temp: 98.5 F (36.9 C)  TempSrc: Oral  SpO2: 96%  Weight: 187 lb (84.8 kg)  Height: _0  (1.778 m)  PainSc: 2    Body mass index is 26.83 kg/m.     11/06/2021    9:59 AM 10/06/2021    3:23 PM 02/28/2021    8:21 AM  Advanced Directives  Does Patient Have a Medical Advance Directive? No No No  Would patient like information on creating a medical advance directive?  Yes (MAU/Ambulatory/Procedural Areas - Information given)     Current Medications (verified) Outpatient Encounter Medications as of 03/06/2022  Medication Sig   allopurinol (ZYLOPRIM) 300 MG tablet TAKE ONE TABLET BY MOUTH DAILY   azelastine (ASTELIN) 0.1 % nasal spray Place 1 spray into both nostrils 2 (two) times daily. Use in each nostril as directed   benazepril (LOTENSIN) 40 MG tablet TAKE 1 TABLET BY MOUTH DAILY AT 6 IN THE MORNING.   Blood Glucose Calibration (OT ULTRA/FASTTK CNTRL SOLN) SOLN Use as instructed to check blood sugars twice a day.   Blood Glucose Monitoring Suppl (ONE TOUCH ULTRA 2) w/Device KIT Use as instructed to check blood sugars twice a day.   gabapentin (NEURONTIN) 100 MG capsule TAKE ONE CAPSULE BY MOUTH THREE TIMES A DAY   ketoconazole (NIZORAL) 2 % shampoo Apply 1 application topically 2 (two) times a week. As needed for scalp dermatitis   Lancets Misc. (ONE TOUCH SURESOFT) MISC Use as instructed to check blood sugars twice a day.   Magnesium Gluconate 500 (27 MG) MG TABS Take 1 tablet by mouth every evening.    melatonin 5 MG TABS Take 1 tablet by mouth at bedtime.    MULTIPLE VITAMIN PO Take by mouth.    ONETOUCH ULTRA test strip Use as instructed to check blood sugars twice a day.   OZEMPIC, 1 MG/DOSE, 4 MG/3ML SOPN Inject 1 mg into the skin once a week.   rosuvastatin (CRESTOR) 5 MG tablet TAKE ONE TABLET BY MOUTH EVERY NIGHT AT BEDTIME   saw palmetto 160 MG capsule Take 1 capsule (160 mg total) by mouth 2 (two) times daily.   sertraline (ZOLOFT) 50 MG tablet TAKE ONE TABLET BY MOUTH DAILY   sildenafil (REVATIO) 20 MG tablet Take 3-5 tablets daily by mouth as needed   tamsulosin (FLOMAX) 0.4 MG CAPS capsule Take 2 capsules (0.8 mg total) by mouth daily.   traZODone (DESYREL) 50 MG tablet TAKE 1.5 TABLET BY MOUTH EVERY NIGHT AT BEDTIME   vitamin B-12 (CYANOCOBALAMIN) 1000 MCG tablet Take by mouth.   zinc gluconate 50 MG tablet Take 50 mg by mouth daily.   No facility-administered encounter medications on file as of 03/06/2022.    Allergies (verified) Bee venom   History: Past Medical History:  Diagnosis Date   Anxiety    Cholelithiasis    DM (diabetes mellitus), type 2 with neurological complications (HCC)    GERD (gastroesophageal reflux disease)    Hyperlipidemia    Pancytopenia (HCC)    Seasonal allergic rhinitis due to other allergic trigger  Past Surgical History:  Procedure Laterality Date   Lung Collapse  66 years old.    Family History  Problem Relation Age of Onset   Diabetes Father        complications of DM caused death   Heart attack Father    Diabetes Sister    Cancer Mother        lung   Lung cancer Mother    Social History   Socioeconomic History   Marital status: Married    Spouse name: Nissan Frazzini   Number of children: 2   Years of education: Not on file   Highest education level: Not on file  Occupational History   Occupation: retired  Tobacco Use   Smoking status: Former    Years: 10.00    Types: Cigarettes    Quit date: 08/08/1999    Years since quitting: 22.5   Smokeless tobacco: Former  Scientific laboratory technician Use: Never used  Substance  and Sexual Activity   Alcohol use: Yes    Alcohol/week: 6.0 standard drinks of alcohol    Types: 6 Cans of beer per week   Drug use: No   Sexual activity: Yes  Other Topics Concern   Not on file  Social History Narrative   Not on file   Social Determinants of Health   Financial Resource Strain: Low Risk  (03/06/2022)   Overall Financial Resource Strain (CARDIA)    Difficulty of Paying Living Expenses: Not hard at all  Food Insecurity: No Food Insecurity (03/06/2022)   Hunger Vital Sign    Worried About Running Out of Food in the Last Year: Never true    [place] in the Last Year: Never true  Transportation Needs: No Transportation Needs (03/06/2022)   PRAPARE - Hydrologist (Medical): No    Lack of Transportation (Non-Medical): No  Physical Activity: Sufficiently Active (03/06/2022)   Exercise Vital Sign    Days of Exercise per Week: 7 days    Minutes of Exercise per Session: 30 min  Stress: No Stress Concern Present (03/06/2022)   [place]    Feeling of Stress : Not at all  Social Connections: Moderately Integrated (03/06/2022)   Social Connection and Isolation Panel [NHANES]    Frequency of Communication with Friends and Family: More than three times a week    Frequency of Social Gatherings with Friends and Family: More than three times a week    Attends Religious Services: Never    Marine scientist or Organizations: Yes    Attends Archivist Meetings: Never    Marital Status: Married    Tobacco Counseling Counseling given: Not Answered   Clinical Intake:  Pre-visit preparation completed: No  Pain : 0-10 Pain Score: 2  Pain Type: Chronic pain Pain Location: Hip Pain Orientation: Left Pain Descriptors / Indicators: Tingling, Aching Pain Onset: Other (comment) Pain Frequency: Intermittent     Nutritional Status: BMI of 19-24  Normal Nutritional  Risks: None  How often do you need to have someone help you when you read instructions, pamphlets, or other written materials from your doctor or pharmacy?: 1 - Never  Diabetic?Nutrition Risk Assessment:  Has the patient had any N/V/D within the last 2 months?  No  Does the patient have any non-healing wounds?  No  Has the patient had any unintentional weight loss or weight gain?  No   Diabetes:  Is the patient diabetic?  Yes  If diabetic, was a CBG obtained today?  Yes  Did the patient bring in their glucometer from home?  No  How often do you monitor your CBG's? Daily .   Financial Strains and Diabetes Management:  Are you having any financial strains with the device, your supplies or your medication? No .  Does the patient want to be seen by Chronic Care Management for management of their diabetes?  No  Would the patient like to be referred to a Nutritionist or for Diabetic Management?  No   Diabetic Exams:  Diabetic Eye Exam: Completed 02/06/2022  Dr. Gloriann Loan Diabetic Foot Exam: Completed 06/14/2021    Interpreter Needed?: No  Information entered by :: Donnie [place], [place]   Activities of Daily Living    03/06/2022    8:04 AM  In your present state of health, do you have any difficulty performing the following activities:  Hearing? 1  Comment tinnutis  Vision? 1  Difficulty concentrating or making decisions? 1  Walking or climbing stairs? 1  Dressing or bathing? 0  Doing errands, shopping? 0    Patient Care Team: [name], DO as PCP - General (Family Medicine) Curley Spice, Virl Diamond, RPH-CPP as Pharmacist (Pharmacist) Earlie Server, MD as Consulting Physician (Hematology and Oncology)  Indicate any recent Medical Services you may have received from other than [facility] providers in the past year (date may be approximate). No hospitalization in the past 12 months.     Assessment:   This is a routine wellness examination for [place].  Hearing/Vision  screen Denies any hearing issues, but admits issues with tinnitus. Denies any vision issues. Wear glasses. Annual Eye exam done by Dr. Gloriann Loan.   Dietary issues and exercise activities discussed: Current Exercise Habits: Structured exercise class, Time (Minutes): 30, Frequency (Times/Week): 7, Weekly Exercise (Minutes/Week): 210, Intensity: Moderate   Goals Addressed   None    Depression Screen    03/06/2022    8:09 AM 09/20/2021    8:08 AM 06/14/2021    9:26 AM 02/28/2021    8:22 AM 06/15/2020    8:39 AM 12/15/2019    8:41 AM 06/10/2019    8:44 AM  PHQ 2/9 Scores  PHQ - 2 Score 0 0 0 0 0 0 2  PHQ- 9 Score  1 0  0 1 3    Fall Risk    03/06/2022    8:04 AM 09/20/2021    8:08 AM 02/28/2021    8:22 AM 06/15/2020    8:08 AM 12/15/2019    8:05 AM  Fall Risk   Falls in the past year? 1 0 0 0 0  Number falls in past yr: 0 0  0 0  Injury with Fall? 1 0  0 0  Risk for fall due to : Impaired mobility No Fall Risks Medication side effect    Follow up Falls evaluation completed Falls evaluation completed Falls evaluation completed;Education provided;Falls prevention discussed Falls evaluation completed Falls evaluation completed    FALL RISK PREVENTION PERTAINING TO THE HOME:  Any stairs in or around the home? Yes  If so, are there any without handrails? Yes  Home free of loose throw rugs in walkways, pet beds, electrical cords, etc? Yes  Adequate lighting in your home to reduce risk of falls? Yes   ASSISTIVE DEVICES UTILIZED TO PREVENT FALLS:  Life alert? No  Use of a cane, walker or w/c? No  Grab bars in the bathroom? No  Shower chair or bench in shower? No  Elevated toilet seat or a handicapped toilet? No   TIMED UP AND GO:  Was the test performed? Yes .  Length of time to ambulate 10 feet: 10  sec.   Gait steady and fast without use of assistive device  Cognitive Function:        03/06/2022    8:37 AM 02/28/2021    8:23 AM  6CIT Screen  What Year? 0 points 0 points  What  month? 0 points 0 points  What time? 0 points 0 points  Count back from 20 0 points 0 points  Months in reverse 2 points 4 points  Repeat phrase 0 points 0 points  Total Score 2 points 4 points    Immunizations Immunization History  Administered Date(s) Administered   Fluad Quad(high Dose 65+) 06/15/2020   Influenza,inj,Quad PF,6+ Mos 03/17/2014, 04/12/2016, 05/06/2017, 06/02/2018, 06/10/2019   Influenza-Unspecified 04/08/2014, 05/11/2015, 06/08/2021   PFIZER(Purple Top)SARS-COV-2 Vaccination 10/27/2019, 11/17/2019, 06/07/2020   Pfizer Covid-19 Vaccine Bivalent Booster 72yr & up 06/08/2021   Pneumococcal Conjugate-13 12/15/2019   Pneumococcal Polysaccharide-23 03/09/2014, 04/22/2014   Tdap 07/09/2008, 04/08/2014    TDAP status: Up to date  Flu Vaccine status: Up to date  Pneumococcal vaccine status: Up to date, completed Pneumococcal 23 in the office today.   Covid-19 vaccine status: Completed vaccines  Qualifies for Shingles Vaccine? Yes   Zostavax completed No   Shingrix Completed?: No.    Education has been provided regarding the importance of this vaccine. Patient has been advised to call insurance company to determine out of pocket expense if they have not yet received this vaccine. Advised may also receive vaccine at local pharmacy or Health Dept. Verbalized acceptance and understanding.  Screening Tests Health Maintenance  Topic Date Due   Zoster Vaccines- Shingrix (1 of 2) Never done   Pneumonia Vaccine 67 Years old (3 - PPSV23 or PCV20) 12/14/2020   COVID-19 Vaccine (5 - Pfizer series) 10/07/2021   HEMOGLOBIN A1C  12/07/2021   INFLUENZA VACCINE  02/06/2022   FOOT EXAM  06/14/2022   Fecal DNA (Cologuard)  12/28/2022   OPHTHALMOLOGY EXAM  02/07/2023   TETANUS/TDAP  04/08/2024   Hepatitis C Screening  Completed   HPV VACCINES  Aged Out   COLONOSCOPY (Pts 45-461yrInsurance coverage will need to be confirmed)  Discontinued    Health Maintenance  Health  Maintenance Due  Topic Date Due   Zoster Vaccines- Shingrix (1 of 2) Never done   Pneumonia Vaccine 66Years old (3 - PPSV23 or PCV20) 12/14/2020   COVID-19 Vaccine (5 - Pfizer series) 10/07/2021   HEMOGLOBIN A1C  12/07/2021   INFLUENZA VACCINE  02/06/2022    Colorectal cancer screening: Type of screening: Cologuard. Completed 12/28/2019. Repeat every 3 years  Lung Cancer Screening: (Low Dose CT Chest recommended if Age 67-80ears, 30 pack-year currently smoking OR have quit w/in 15years.) does not qualify.   Lung Cancer Screening Referral:  does not qualify   Additional Screening:  Hepatitis C Screening: does qualify; Completed   Vision Screening: Recommended annual ophthalmology exams for early detection of glaucoma and other disorders of the eye. Is the patient up to date with their annual eye exam?  Yes  Who is the provider or what is the name of the office in which the patient attends annual eye exams? Dr. BeGloriann Loanf pt is not established with a provider, would they like to be referred to a provider to establish care? No .  Dental Screening: Recommended annual dental exams for proper oral hygiene  Community Resource Referral / Chronic Care Management: CRR required this visit?  No   CCM required this visit?  No      Plan:     I have personally reviewed and noted the following in the patient's chart:   Medical and social history Use of alcohol, tobacco or illicit drugs  Current medications and supplements including opioid prescriptions. Patient is not currently taking opioid prescriptions. Functional ability and status Nutritional status Physical activity Advanced directives List of other physicians Hospitalizations, surgeries, and ER visits in previous 12 months Vitals Screenings to include cognitive, depression, and falls Referrals and appointments  In addition, I have reviewed and discussed with patient certain preventive protocols, quality metrics, and best  practice recommendations. A written personalized care plan for preventive services as well as general preventive health recommendations were provided to patient.    Richard Day , Thank you for taking time to come for your Medicare Wellness Visit. I appreciate your ongoing commitment to your health goals. Please review the following plan we discussed and let me know if I can assist you in the future.   These are the goals we discussed:  Goals      Patient Stated     02/28/2021, maintain     Pharmacy Goals     Our goal A1c is less than 7%. This corresponds with fasting sugars less than 130 and 2 hour after meal sugars less than 180. Please continue keep a log of results when you check your blood sugar at home.  Feel free to call me with any questions or concerns. I look forward to our next call!   Wallace Cullens, PharmD, [place] [phone]        This is a list of the screening recommended for you and due dates:  Health Maintenance  Topic Date Due   Zoster (Shingles) Vaccine (1 of 2) Never done   Pneumonia Vaccine (3 - PPSV23 or PCV20) 12/14/2020   COVID-19 Vaccine (5 - Pfizer series) 10/07/2021   Hemoglobin A1C  12/07/2021   Flu Shot  02/06/2022   Complete foot exam   06/14/2022   Cologuard (Stool DNA test)  12/28/2022   Eye exam for diabetics  02/07/2023   Tetanus Vaccine  04/08/2024   Hepatitis C Screening: USPSTF Recommendation to screen - Ages 18-79 yo.  Completed   HPV Vaccine  Aged Out   Colon Cancer Screening  Discontinued     Wilson Singer, [facility]   03/06/2022   Nurse Notes: Approximately 40 minute face to face visit

## 2022-03-15 ENCOUNTER — Other Ambulatory Visit: Payer: Self-pay | Admitting: Urology

## 2022-03-15 DIAGNOSIS — N401 Enlarged prostate with lower urinary tract symptoms: Secondary | ICD-10-CM

## 2022-03-15 DIAGNOSIS — N5201 Erectile dysfunction due to arterial insufficiency: Secondary | ICD-10-CM

## 2022-03-28 ENCOUNTER — Telehealth: Payer: Self-pay

## 2022-03-28 NOTE — Telephone Encounter (Signed)
Called and left a message letting him know that his Ozempic has been delivered to the office and is ready for him to pick up at his convenience.

## 2022-05-04 ENCOUNTER — Other Ambulatory Visit: Payer: Self-pay | Admitting: Family Medicine

## 2022-05-04 DIAGNOSIS — F339 Major depressive disorder, recurrent, unspecified: Secondary | ICD-10-CM

## 2022-05-04 MED ORDER — SERTRALINE HCL 50 MG PO TABS: 50.0000 mg | ORAL_TABLET | Freq: Every day | ORAL | 3 refills | Status: DC

## 2022-05-09 ENCOUNTER — Other Ambulatory Visit: Payer: Medicare HMO

## 2022-05-16 ENCOUNTER — Ambulatory Visit: Payer: Medicare HMO | Admitting: Oncology

## 2022-06-07 ENCOUNTER — Encounter: Payer: Self-pay | Admitting: Urology

## 2022-06-11 ENCOUNTER — Other Ambulatory Visit: Payer: Medicare HMO

## 2022-06-11 DIAGNOSIS — R350 Frequency of micturition: Secondary | ICD-10-CM | POA: Diagnosis not present

## 2022-06-11 DIAGNOSIS — N401 Enlarged prostate with lower urinary tract symptoms: Secondary | ICD-10-CM

## 2022-06-12 LAB — PSA: Prostate Specific Ag, Serum: 0.8 ng/mL (ref 0.0–4.0)

## 2022-06-13 ENCOUNTER — Ambulatory Visit: Payer: Medicare HMO | Admitting: Pharmacist

## 2022-06-13 DIAGNOSIS — E1149 Type 2 diabetes mellitus with other diabetic neurological complication: Secondary | ICD-10-CM

## 2022-06-13 NOTE — Progress Notes (Signed)
This encounter was created in error - please disregard.

## 2022-06-13 NOTE — Patient Instructions (Signed)
Goals Addressed             This Visit's Progress    Pharmacy Goals       Our goal A1c is less than 7%. This corresponds with fasting sugars less than 130 and 2 hour after meal sugars less than 180. Please continue keep a log of results when you check your blood sugar at home.  Feel free to call me with any questions or concerns. I look forward to our next call!  Noel Henandez Jaden Batchelder, PharmD, BCACP Clinical Pharmacist [facility] [phone]        

## 2022-06-13 NOTE — Chronic Care Management (AMB) (Addendum)
06/13/2022 Name: Cooper Render Jubb Sr. MRN: [id number] DOB: January 20, 1956  Chief Complaint  Patient presents with   Medication Management    T2DM   Medication Assistance    NIKIA MANGINO Sr. is a 66 y.o. year old male who presented for a telephone visit.   They were referred to the pharmacist by their PCP for assistance in managing diabetes and medication access.    Subjective:  Care Team: Primary Care Provider: [name], DO ; Next Scheduled Visit: 06/27/2022 Urology: [name], MD; Next Scheduled Visit: 06/14/2022  Medication Access/Adherence  Current Pharmacy:  [facility] ([place] [place], [place] [address] Phone: [phone] Fax: [phone]  [facility] [id number] - [place], [place] - [address] [place] [address] Phone: [phone] Fax: [phone]   Patient reports affordability concerns with their medications: No  Patient reports access/transportation concerns to their pharmacy: No  Patient reports adherence concerns with their medications:  No    Patient notes he has been having pain recently and from seeing a chiropractor, was advised that he has a pinched nerve.  - Reports plans to discuss with PCP at upcoming appointment. Denies need to contact office to be seen sooner  Diabetes:  Current medications: Ozempic 1 mg weekly Medications tried in the past: metformin  Current glucose readings: reports recent fasting readings ranging 92-166 (with average of 130), except for reading of 238 this morning. Reports upon recheck, reading down to 177 - Reports not sure what caused elevated reading this morning. Recalls had [place] and lettuce wrap with 1 beer at supper last night and bedtime snack on peanut butter crackers  Note since restarted drinking, cut back from ~12 cans of beer/day to currently drinking ~6 cans/day   Current physical  activity: reports stays active throughout the day  Statin: rosuvastatin 5 mg daily  Current medication access support: APPROVED for patient assistance for Ozempic from [employer] for 2023 calendar year  - Reports currently has ~2 months supply of Ozempic remaining     Objective: Lab Results  Component Value Date   HGBA1C 5.6 06/08/2021    Lab Results  Component Value Date   CREATININE 1.16 10/09/2021   BUN 18 10/09/2021   NA 136 10/09/2021   K 4.6 10/09/2021   CL 102 10/09/2021   CO2 27 10/09/2021    Lab Results  Component Value Date   CHOL 150 06/08/2021   HDL 66 06/08/2021   LDLCALC 71 06/08/2021   TRIG 54 06/08/2021   CHOLHDL 2.3 06/08/2021    Medications     Reviewed by Wilson Singer, CMA (Certified Medical Assistant) on 03/06/22 at [facility] List Status: <None>   Medication Order Taking? Sig Documenting Provider Last Dose Status Informant  allopurinol (ZYLOPRIM) 300 MG tablet [id number] Yes TAKE ONE TABLET BY MOUTH DAILY [name], [name], MD Taking Active   azelastine (ASTELIN) 0.1 % nasal spray [id number] Yes Place 1 spray into both nostrils 2 (two) times daily. Use in each nostril as directed [name], DO Taking Active   benazepril (LOTENSIN) 40 MG tablet [id number] Yes TAKE 1 TABLET BY MOUTH DAILY AT 6 IN THE MORNING. [name], DO Taking Active   Blood Glucose Calibration (OT ULTRA/FASTTK CNTRL SOLN) SOLN [id number] Yes Use as instructed to check blood sugars twice a day. [name], DO Taking Active   Blood Glucose Monitoring Suppl (  ONE TOUCH ULTRA 2) w/Device KIT [id number] Yes Use as instructed to check blood sugars twice a day. [name], DO Taking Active   gabapentin (NEURONTIN) 100 MG capsule [id number] Yes TAKE ONE CAPSULE BY MOUTH THREE TIMES A DAY [name], [name], DO Taking Active   ketoconazole (NIZORAL) 2 % shampoo [id number] Yes Apply 1 application topically 2 (two) times a week. As  needed for scalp dermatitis Parks Ranger, [name], DO Taking Active   Lancets Misc. ([place]) [place] [id number] Yes Use as instructed to check blood sugars twice a day. [name], DO Taking Active   Magnesium Gluconate 500 (27 MG) MG TABS [id number] Yes Take 1 tablet by mouth every evening.  [name], MD Taking Active            Med Note [facility], [name]   Sat Nov 06, 2014 11:01 AM) Received from: [employer]  melatonin 5 MG TABS [id number] Yes Take 1 tablet by mouth at bedtime.  [provider] Taking Active   MULTIPLE VITAMIN PO [id number] Yes Take by mouth. [name], MD Taking Active            Med Note [facility], [name]   Sat Nov 06, 2014 11:01 AM) Received from: [facility] test strip [id number] Yes Use as instructed to check blood sugars twice a day. [name], DO Taking Active   OZEMPIC, 1 MG/DOSE, 4 MG/3ML SOPN [id number] Yes Inject 1 mg into the skin once a week. [name], DO Taking Active   rosuvastatin (CRESTOR) 5 MG tablet [id number] Yes TAKE ONE TABLET BY MOUTH EVERY NIGHT AT BEDTIME [name], DO Taking Active   saw palmetto 160 MG capsule [id number] Yes Take 1 capsule (160 mg total) by mouth 2 (two) times daily. [name], DO Taking Active   sertraline (ZOLOFT) 50 MG tablet [id number] Yes TAKE ONE TABLET BY MOUTH DAILY [place] Ranger, [name], DO Taking Active   sildenafil (REVATIO) 20 MG tablet [id number] Yes Take 3-5 tablets daily by mouth as needed [name], [name], MD Taking Active   tamsulosin (FLOMAX) 0.4 MG CAPS capsule [id number] Yes Take 2 capsules (0.8 mg total) by mouth daily. [name], MD Taking Active   traZODone (DESYREL) 50 MG tablet [id number] Yes TAKE 1.5 TABLET BY MOUTH EVERY NIGHT AT BEDTIME [name], DO Taking Active   vitamin B-12 (CYANOCOBALAMIN) 1000 MCG tablet [id number] Yes Take  by mouth. [provider] Taking Active   zinc gluconate 50 MG tablet [id number] Yes Take 50 mg by mouth daily. [provider] Taking Active               Assessment/Plan:    Diabetes: - Currently controlled - Again discuss impact of alcohol consumption on blood sugar control Reports since restarted drinking, cut back from ~12 cans of beer/day to currently drinking ~6 cans/day Encourage patient to continue cut back on alcohol consumption with a goal of totally stop drinking Declined need for further support at this time - Discuss importance of having regular well-balanced meals, while controlling carbohydrate portion sizes - Encourage patient to continue to monitor home blood sugar, keep log of results and have this record to review at medical appointments  Patient to contact office or clinical pharmacist sooner if needed for readings outside of established parameters or new symptoms - Meets financial criteria for Ozempic patient assistance program through [employer]. Will collaborate with  provider, CPhT, and patient to pursue assistance re-enrollment.     Follow Up Plan: Clinical Pharmacist to follow up with patient by telephone on 07/30/2022 at 10:45 am  Wallace Cullens, PharmD, Para March, [facility] [phone]

## 2022-06-14 ENCOUNTER — Encounter: Payer: Self-pay | Admitting: Urology

## 2022-06-14 ENCOUNTER — Ambulatory Visit: Payer: Medicare HMO | Admitting: Urology

## 2022-06-14 VITALS — BP 162/77 | HR 87 | Ht 70.0 in | Wt 185.0 lb

## 2022-06-14 DIAGNOSIS — R350 Frequency of micturition: Secondary | ICD-10-CM

## 2022-06-14 DIAGNOSIS — N5201 Erectile dysfunction due to arterial insufficiency: Secondary | ICD-10-CM | POA: Diagnosis not present

## 2022-06-14 DIAGNOSIS — Z87442 Personal history of urinary calculi: Secondary | ICD-10-CM | POA: Diagnosis not present

## 2022-06-14 DIAGNOSIS — N401 Enlarged prostate with lower urinary tract symptoms: Secondary | ICD-10-CM | POA: Diagnosis not present

## 2022-06-14 DIAGNOSIS — Z87898 Personal history of other specified conditions: Secondary | ICD-10-CM

## 2022-06-14 DIAGNOSIS — R972 Elevated prostate specific antigen [PSA]: Secondary | ICD-10-CM

## 2022-06-14 MED ORDER — SILDENAFIL CITRATE 20 MG PO TABS: ORAL_TABLET | ORAL | 1 refills | Status: DC

## 2022-06-14 MED ORDER — TAMSULOSIN HCL 0.4 MG PO CAPS: 0.8000 mg | ORAL_CAPSULE | Freq: Every day | ORAL | 3 refills | Status: DC

## 2022-06-14 NOTE — Progress Notes (Signed)
06/14/2022 9:15 AM   Richard Day. 10-13-1955 [id number]  Referring provider: [name], DO [address] [place],  [place] [address]  Chief Complaint  Patient presents with   Follow-up    Urologic history:   1.  History elevated PSA -Prostate biopsy 2007 Dr. Jacqlyn Larsen; PSA not listed in record review -Path ASAP -Rebiopsy negative   2.  BPH with lower urinary tract symptoms -Tamsulosin   3.  History urinary calculi -History uric acid stones -On allopurinol   4.  Erectile dysfunction -On generic sildenafil   HPI: 66 y.o. male presents for annual follow-up.  Doing well since last visit Stable LUTS Denies dysuria, gross hematuria Sildenafil remains effective for his ED No flank, abdominal or pelvic pain PSA 06/11/2022 stable at 0.8   PMH: Past Medical History:  Diagnosis Date   Anxiety    Cholelithiasis    DM (diabetes mellitus), type 2 with neurological complications (HCC)    GERD (gastroesophageal reflux disease)    Hyperlipidemia    Pancytopenia (HCC)    Seasonal allergic rhinitis due to other allergic trigger     Surgical History: Past Surgical History:  Procedure Laterality Date   Lung Collapse  66 years old.     Home Medications:  Allergies as of 06/14/2022       Reactions   Bee Venom Swelling   Itching         Medication List        Accurate as of June 14, 2022  9:15 AM. If you have any questions, ask your nurse or doctor.          allopurinol 300 MG tablet Commonly known as: ZYLOPRIM TAKE ONE TABLET BY MOUTH DAILY   azelastine 0.1 % nasal spray Commonly known as: ASTELIN Place 1 spray into both nostrils 2 (two) times daily. Use in each nostril as directed   benazepril 40 MG tablet Commonly known as: LOTENSIN TAKE 1 TABLET BY MOUTH DAILY AT 6 IN THE MORNING.   cyanocobalamin 1000 MCG tablet Commonly known as: VITAMIN B12 Take by mouth.   gabapentin 100 MG capsule Commonly known as: NEURONTIN TAKE ONE  CAPSULE BY MOUTH THREE TIMES A DAY   ketoconazole 2 % shampoo Commonly known as: NIZORAL Apply 1 application topically 2 (two) times a week. As needed for scalp dermatitis   Magnesium Gluconate 500 (27 Mg) MG Tabs Take 1 tablet by mouth every evening.   melatonin 5 MG Tabs Take 1 tablet by mouth at bedtime.   MULTIPLE VITAMIN PO Take by mouth.   ONE TOUCH SURESOFT Misc Use as instructed to check blood sugars twice a day.   ONE TOUCH ULTRA 2 w/Device Kit Use as instructed to check blood sugars twice a day.   OneTouch Ultra test strip Generic drug: glucose blood Use as instructed to check blood sugars twice a day.   OT ULTRA/FASTTK CNTRL SOLN Soln Use as instructed to check blood sugars twice a day.   Ozempic (1 MG/DOSE) 4 MG/3ML Sopn Generic drug: Semaglutide (1 MG/DOSE) Inject 1 mg into the skin once a week.   rosuvastatin 5 MG tablet Commonly known as: CRESTOR TAKE ONE TABLET BY MOUTH EVERY NIGHT AT BEDTIME   saw palmetto 160 MG capsule Take 1 capsule (160 mg total) by mouth 2 (two) times daily.   sertraline 50 MG tablet Commonly known as: ZOLOFT Take 1 tablet (50 mg total) by mouth daily.   sildenafil 20 MG tablet Commonly known as: REVATIO  TAKE 3-5 TABLETS BY MOUTH DAILY AS NEEDED   tamsulosin 0.4 MG Caps capsule Commonly known as: FLOMAX Take 2 capsules (0.8 mg total) by mouth daily.   traZODone 50 MG tablet Commonly known as: DESYREL TAKE 1.5 TABLET BY MOUTH EVERY NIGHT AT BEDTIME   zinc gluconate 50 MG tablet Take 50 mg by mouth daily.        Allergies:  Allergies  Allergen Reactions   Bee Venom Swelling    Itching     Family History: Family History  Problem Relation Age of Onset   Diabetes Father        complications of DM caused death   Heart attack Father    Diabetes Sister    Cancer Mother        lung   Lung cancer Mother     Social History:  reports that he quit smoking about 22 years ago. His smoking use included cigarettes.  He has quit using smokeless tobacco. He reports current alcohol use of about 6.0 standard drinks of alcohol per week. He reports that he does not use drugs.   Physical Exam: BP (!) 162/77   Pulse 87   Ht _0  (1.778 m)   Wt 185 lb (83.9 kg)   BMI 26.54 kg/m   Constitutional:  Alert and oriented, No acute distress. HEENT: [place] AT Respiratory: Normal respiratory effort, no increased work of breathing. Psychiatric: Normal mood and affect.    Assessment & Plan:    1. Benign prostatic hyperplasia with urinary frequency Stable  Tamsulosin refilled  2.  History elevated PSA Low and stable PSA  3.  Erectile dysfunction Stable Sildenafil refilled   [name], MD  [facility] [address] [place], [place] [address] [phone]

## 2022-06-15 ENCOUNTER — Telehealth: Payer: Self-pay | Admitting: Pharmacy Technician

## 2022-06-15 DIAGNOSIS — Z596 Low income: Secondary | ICD-10-CM

## 2022-06-15 NOTE — Progress Notes (Signed)
[facility])                                            [place] Team    06/15/2022  Renaldo Gornick Odonoghue Sr. 1956/06/19 [id number]                                      Medication Assistance Referral  Referral From: [place] RPh Dorthula Perfect   Medication/Company: Larna Daughters / Novo Nordisk Patient application portion:  Mailed Provider application portion: Faxed  to Dr. Nobie Putnam Provider address/fax verified via: Office website   Xavius Spadafore P. Sims [place], [place]  [phone]

## 2022-06-18 ENCOUNTER — Other Ambulatory Visit: Payer: Self-pay

## 2022-06-18 DIAGNOSIS — Z0000 Encounter for general adult medical examination without abnormal findings: Secondary | ICD-10-CM

## 2022-06-18 DIAGNOSIS — D61818 Other pancytopenia: Secondary | ICD-10-CM

## 2022-06-18 DIAGNOSIS — E1149 Type 2 diabetes mellitus with other diabetic neurological complication: Secondary | ICD-10-CM

## 2022-06-18 DIAGNOSIS — E1169 Type 2 diabetes mellitus with other specified complication: Secondary | ICD-10-CM

## 2022-06-19 ENCOUNTER — Other Ambulatory Visit: Payer: Medicare HMO

## 2022-06-19 ENCOUNTER — Encounter: Payer: Medicare HMO | Admitting: Family Medicine

## 2022-06-19 DIAGNOSIS — E1149 Type 2 diabetes mellitus with other diabetic neurological complication: Secondary | ICD-10-CM | POA: Diagnosis not present

## 2022-06-19 DIAGNOSIS — E785 Hyperlipidemia, unspecified: Secondary | ICD-10-CM | POA: Diagnosis not present

## 2022-06-19 DIAGNOSIS — Z0000 Encounter for general adult medical examination without abnormal findings: Secondary | ICD-10-CM | POA: Diagnosis not present

## 2022-06-19 DIAGNOSIS — D61818 Other pancytopenia: Secondary | ICD-10-CM | POA: Diagnosis not present

## 2022-06-19 DIAGNOSIS — E1169 Type 2 diabetes mellitus with other specified complication: Secondary | ICD-10-CM | POA: Diagnosis not present

## 2022-06-20 LAB — HEMOGLOBIN A1C
Hgb A1c MFr Bld: 6.3 % of total Hgb — ABNORMAL HIGH (ref <5.7)
Mean Plasma Glucose: 134 mg/dL
eAG (mmol/L): 7.4 mmol/L

## 2022-06-20 LAB — LIPID PANEL
Cholesterol: 156 mg/dL (ref <200)
HDL: 50 mg/dL (ref >=40)
LDL Cholesterol (Calc): 86 mg/dL (calc)
Non-HDL Cholesterol (Calc): 106 mg/dL (calc) (ref <130)
Total CHOL/HDL Ratio: 3.1 (calc) (ref <5.0)
Triglycerides: 102 mg/dL (ref <150)

## 2022-06-20 LAB — CBC WITH DIFFERENTIAL/PLATELET
Absolute Monocytes: 429 cells/uL (ref 200–950)
Basophils Absolute: 42 cells/uL (ref 0–200)
Basophils Relative: 1.3 %
Eosinophils Absolute: 282 cells/uL (ref 15–500)
Eosinophils Relative: 8.8 %
HCT: 37.8 % — ABNORMAL LOW (ref 38.5–50.0)
Hemoglobin: 13.3 g/dL (ref 13.2–17.1)
Lymphs Abs: 810 cells/uL — ABNORMAL LOW (ref 850–3900)
MCH: 34 pg — ABNORMAL HIGH (ref 27.0–33.0)
MCHC: 35.2 g/dL (ref 32.0–36.0)
MCV: 96.7 fL (ref 80.0–100.0)
MPV: 10.3 fL (ref 7.5–12.5)
Monocytes Relative: 13.4 %
Neutro Abs: 1638 cells/uL (ref 1500–7800)
Neutrophils Relative %: 51.2 %
Platelets: 122 10*3/uL — ABNORMAL LOW (ref 140–400)
RBC: 3.91 10*6/uL — ABNORMAL LOW (ref 4.20–5.80)
RDW: 13.5 % (ref 11.0–15.0)
Total Lymphocyte: 25.3 %
WBC: 3.2 10*3/uL — ABNORMAL LOW (ref 3.8–10.8)

## 2022-06-20 LAB — TSH: TSH: 3.19 mIU/L (ref 0.40–4.50)

## 2022-06-20 LAB — COMPLETE METABOLIC PANEL WITH GFR
AG Ratio: 1.2 (calc) (ref 1.0–2.5)
ALT: 36 U/L (ref 9–46)
AST: 41 U/L — ABNORMAL HIGH (ref 10–35)
Albumin: 4 g/dL (ref 3.6–5.1)
Alkaline phosphatase (APISO): 101 U/L (ref 35–144)
BUN/Creatinine Ratio: 14 (calc) (ref 6–22)
BUN: 20 mg/dL (ref 7–25)
CO2: 25 mmol/L (ref 20–32)
Calcium: 9.5 mg/dL (ref 8.6–10.3)
Chloride: 100 mmol/L (ref 98–110)
Creat: 1.47 mg/dL — ABNORMAL HIGH (ref 0.70–1.35)
Globulin: 3.3 g/dL (calc) (ref 1.9–3.7)
Glucose, Bld: 120 mg/dL — ABNORMAL HIGH (ref 65–99)
Potassium: 4.9 mmol/L (ref 3.5–5.3)
Sodium: 134 mmol/L — ABNORMAL LOW (ref 135–146)
Total Bilirubin: 0.5 mg/dL (ref 0.2–1.2)
Total Protein: 7.3 g/dL (ref 6.1–8.1)
eGFR: 52 mL/min/{1.73_m2} — ABNORMAL LOW (ref >=60)

## 2022-06-20 LAB — VITAMIN B12: Vitamin B-12: 1122 pg/mL — ABNORMAL HIGH (ref 200–1100)

## 2022-06-25 ENCOUNTER — Other Ambulatory Visit: Payer: Self-pay | Admitting: Family Medicine

## 2022-06-25 DIAGNOSIS — E1169 Type 2 diabetes mellitus with other specified complication: Secondary | ICD-10-CM

## 2022-06-26 NOTE — Telephone Encounter (Signed)
Requested Prescriptions  Pending Prescriptions Disp Refills   rosuvastatin (CRESTOR) 5 MG tablet [Pharmacy Med Name: ROSUVASTATIN CALCIUM 5 MG TAB] 90 tablet 0    Sig: TAKE ONE TABLET BY MOUTH EVERY NIGHT AT BEDTIME     Cardiovascular:  Antilipid - Statins 2 Failed - 06/25/2022  9:41 AM      Failed - Cr in normal range and within 360 days    Creat  Date Value Ref Range Status  06/19/2022 1.47 (H) 0.70 - 1.35 mg/dL Final         Failed - Lipid Panel in normal range within the last 12 months    Cholesterol, Total  Date Value Ref Range Status  01/20/2015 226 (H) 100 - 199 mg/dL Final   Cholesterol  Date Value Ref Range Status  06/19/2022 156 <200 mg/dL Final   LDL Cholesterol (Calc)  Date Value Ref Range Status  06/19/2022 86 mg/dL (calc) Final    Comment:    Reference range: <100 . Desirable range <100 mg/dL for primary prevention;   <70 mg/dL for patients with CHD or diabetic patients  with > or = 2 CHD risk factors. Marland Kitchen LDL-C is now calculated using the Martin-Hopkins  calculation, which is a validated novel method providing  better accuracy than the Friedewald equation in the  estimation of LDL-C.  Cresenciano Genre et al. Annamaria Helling. 0998;338(25): 2061-2068  (http://education.[facility].com/faq/FAQ164)    HDL  Date Value Ref Range Status  06/19/2022 50 > OR = 40 mg/dL Final  01/20/2015 44 >39 mg/dL Final    Comment:    According to ATP-III Guidelines, HDL-C >59 mg/dL is considered a negative risk factor for CHD.    Triglycerides  Date Value Ref Range Status  06/19/2022 102 <150 mg/dL Final         Passed - Patient is not pregnant      Passed - Valid encounter within last 12 months    Recent Outpatient Visits           1 week ago DM (diabetes mellitus), type 2 with neurological complications ([place])   [facility] [place], Grayland Ormond A, RPH-CPP   9 months ago DM (diabetes mellitus), type 2 with neurological complications [facility])   [place], DO   1 year ago Annual physical exam   [facility] [name], DO   1 year ago DM (diabetes mellitus), type 2 with neurological complications [facility])   [facility] [name], DO   2 years ago Annual physical exam   [place], [name], DO       Future Appointments             Tomorrow [place] [name], [facility], [place]   In 11 months Bernice, MD [place]

## 2022-06-27 ENCOUNTER — Encounter: Payer: Medicare HMO | Admitting: Family Medicine

## 2022-07-12 ENCOUNTER — Encounter: Payer: Self-pay | Admitting: Family Medicine

## 2022-07-12 ENCOUNTER — Other Ambulatory Visit: Payer: Self-pay | Admitting: Family Medicine

## 2022-07-12 ENCOUNTER — Ambulatory Visit (INDEPENDENT_AMBULATORY_CARE_PROVIDER_SITE_OTHER): Payer: Medicare HMO | Admitting: Family Medicine

## 2022-07-12 VITALS — BP 128/66 | HR 83 | Ht 70.0 in | Wt 190.0 lb

## 2022-07-12 DIAGNOSIS — Z23 Encounter for immunization: Secondary | ICD-10-CM | POA: Diagnosis not present

## 2022-07-12 DIAGNOSIS — E663 Overweight: Secondary | ICD-10-CM

## 2022-07-12 DIAGNOSIS — I10 Essential (primary) hypertension: Secondary | ICD-10-CM

## 2022-07-12 DIAGNOSIS — N401 Enlarged prostate with lower urinary tract symptoms: Secondary | ICD-10-CM

## 2022-07-12 DIAGNOSIS — F411 Generalized anxiety disorder: Secondary | ICD-10-CM

## 2022-07-12 DIAGNOSIS — F1020 Alcohol dependence, uncomplicated: Secondary | ICD-10-CM

## 2022-07-12 DIAGNOSIS — Z0000 Encounter for general adult medical examination without abnormal findings: Secondary | ICD-10-CM | POA: Diagnosis not present

## 2022-07-12 DIAGNOSIS — E1149 Type 2 diabetes mellitus with other diabetic neurological complication: Secondary | ICD-10-CM | POA: Diagnosis not present

## 2022-07-12 DIAGNOSIS — E785 Hyperlipidemia, unspecified: Secondary | ICD-10-CM

## 2022-07-12 DIAGNOSIS — D61818 Other pancytopenia: Secondary | ICD-10-CM

## 2022-07-12 DIAGNOSIS — D696 Thrombocytopenia, unspecified: Secondary | ICD-10-CM

## 2022-07-12 DIAGNOSIS — N138 Other obstructive and reflux uropathy: Secondary | ICD-10-CM

## 2022-07-12 DIAGNOSIS — E1169 Type 2 diabetes mellitus with other specified complication: Secondary | ICD-10-CM

## 2022-07-12 DIAGNOSIS — M5442 Lumbago with sciatica, left side: Secondary | ICD-10-CM

## 2022-07-12 DIAGNOSIS — F339 Major depressive disorder, recurrent, unspecified: Secondary | ICD-10-CM

## 2022-07-12 MED ORDER — PREDNISONE 20 MG PO TABS: ORAL_TABLET | ORAL | 0 refills | Status: DC

## 2022-07-12 MED ORDER — ROSUVASTATIN CALCIUM 5 MG PO TABS: 5.0000 mg | ORAL_TABLET | Freq: Every day | ORAL | 3 refills | Status: DC

## 2022-07-12 MED ORDER — BACLOFEN 10 MG PO TABS: 5.0000 mg | ORAL_TABLET | Freq: Three times a day (TID) | ORAL | 1 refills | Status: DC | PRN

## 2022-07-12 NOTE — Progress Notes (Signed)
Subjective:    Patient ID: Richard Columbia Sr., male    DOB: Dec 11, 1955, 67 y.o.   MRN: [id number]  Richard FREILICH Sr. is a 67 y.o. male presenting on 07/12/2022 for Annual Exam and Back Pain   HPI  Here for Annual Physical and Lab Review  Pancytopenia Low WBC on last lab, now this is repeat. And path smear showed pancytopenia. Pancytopenia identified on CBC, WBC 2.8-3, RBC 4, PLT 120 range, appears to have MCV elevated but pathology smear diagnosed pancytopenia and hypochromic RBC suggesting anemia He admits feeling cold at times. He takes B12 vitamin   CHRONIC DM, Type 2 with DM Neuropathy / BMI >24 Hypertension A1c 6.3 - Today reports he continues to do very well at this time CBGs: Checks CBGs 1-3x daily - avg 130-160, rarely low Meds: - Ozempic 33m weekly (PAP program) - Remains off Metformin - Failed Victoza in past, OFF Glimepiride, OFF NPH Insulin Reports good compliance. Tolerating well w/o side-effects Currently on ACEi Lifestyle: - Diet (improved diet overall still - reduced portions) - Exercise (increased activity and exercise) Chronic issue with known, Bilateral feet, mild loss of sensitivity Taking Vitamin B12 Magnesium, MVI, Melatonin. Admits tingling and neuropathy in feet bilateral. Takes OTC medicine for neuropathy. Still has cramping pain in both calves - worse in AM when wakes up otherwise not bad. On gabapentin 1077mTID limited relief - Last DM Eye exam by Dr BeGloriann Loaneed copy of report. Denies hypoglycemia, polyuria, visual changes or tingling.    Sinusitis, allergic Chronic year round, tried allergy pills and OTC nasal sprays but has rebound.   Major Depression, recurrent in remission / Insomnia Alcohol Dependence in remission Reports his mood has been good overall without new concerns. Continues on SSRI Sertraline 5067maily with great results Continues on Trazodone 1.5 pills for 28m18mghtly for insomnia.   HYPERLIPIDEMIA: - Reports no concerns. Last  lipid panel 06/2021 major improved LDL to 71 from previous 124 pre statin On Rosuvastatin 5mg 95mhtly not causing side effects   CHRONIC HTN: Reports no concerns, some lower BP lately Current Meds - Benazepril 40mg 47my   Reports good compliance, took meds today. Tolerating well, w/o complaints. Denies CP, dyspnea, HA, edema, dizziness / lightheadedness  Alcohol dependence, uncomplicated History of heavy drinking with liquor He has resolved that and now reduced significantly 6 beers per day now he is retired.   Additional issue  Left sided low back pain sciatica Reports stiffness soreness with pain and radiating sciatica.   Health Maintenance:  UTD Pneumonia Vaccine 02/2022  TDap 2015  Due for Flu and Shingles vaccine today   Last colonoscopy approx 2008. He has declined in interval due to ins coverage and other concerns. - Cologuard done 12/28/19 - negative, good for 3 years, next 12/2022  PSA 0.8, negative      03/06/2022    8:09 AM 09/20/2021    8:08 AM 06/14/2021    9:26 AM  Depression screen PHQ 2/9  Decreased Interest 0 0 0  Down, Depressed, Hopeless 0 0 0  PHQ - 2 Score 0 0 0  Altered sleeping  0 0  Tired, decreased energy  1 0  Change in appetite  0 0  Feeling bad or failure about yourself   0 0  Trouble concentrating  0 0  Moving slowly or fidgety/restless  0 0  Suicidal thoughts  0 0  PHQ-9 Score  1 0  Difficult doing work/chores  Not difficult at all  Not difficult at all    Past Medical History:  Diagnosis Date   Anxiety    Cholelithiasis    DM (diabetes mellitus), type 2 with neurological complications (HCC)    GERD (gastroesophageal reflux disease)    Hyperlipidemia    Pancytopenia (HCC)    Seasonal allergic rhinitis due to other allergic trigger    Past Surgical History:  Procedure Laterality Date   Lung Collapse  67 years old.    Social History   Socioeconomic History   Marital status: Married    Spouse name: Keifer Habib   Number of  children: 2   Years of education: Not on file   Highest education level: Not on file  Occupational History   Occupation: retired  Tobacco Use   Smoking status: Former    Years: 10.00    Types: Cigarettes    Quit date: 08/08/1999    Years since quitting: 22.9   Smokeless tobacco: Former  Scientific laboratory technician Use: Never used  Substance and Sexual Activity   Alcohol use: Yes    Alcohol/week: 6.0 standard drinks of alcohol    Types: 6 Cans of beer per week   Drug use: No   Sexual activity: Yes  Other Topics Concern   Not on file  Social History Narrative   Not on file   Social Determinants of Health   Financial Resource Strain: Low Risk  (03/06/2022)   Overall Financial Resource Strain (CARDIA)    Difficulty of Paying Living Expenses: Not hard at all  Food Insecurity: No Food Insecurity (03/06/2022)   Hunger Vital Sign    Worried About Running Out of Food in the Last Year: Never true    [place] in the Last Year: Never true  Transportation Needs: No Transportation Needs (03/06/2022)   PRAPARE - Hydrologist (Medical): No    Lack of Transportation (Non-Medical): No  Physical Activity: Sufficiently Active (03/06/2022)   Exercise Vital Sign    Days of Exercise per Week: 7 days    Minutes of Exercise per Session: 30 min  Stress: No Stress Concern Present (03/06/2022)   [place]    Feeling of Stress : Not at all  Social Connections: Moderately Integrated (03/06/2022)   Social Connection and Isolation Panel [NHANES]    Frequency of Communication with Friends and Family: More than three times a week    Frequency of Social Gatherings with Friends and Family: More than three times a week    Attends Religious Services: Never    Marine scientist or Organizations: Yes    Attends Archivist Meetings: Never    Marital Status: Married  Human resources officer Violence: Not At Risk  (03/06/2022)   Humiliation, Afraid, Rape, and Kick questionnaire    Fear of Current or Ex-Partner: No    Emotionally Abused: No    Physically Abused: No    Sexually Abused: No   Family History  Problem Relation Age of Onset   Diabetes Father        complications of DM caused death   Heart attack Father    Diabetes Sister    Cancer Mother        lung   Lung cancer Mother    Current Outpatient Medications on File Prior to Visit  Medication Sig   allopurinol (ZYLOPRIM) 300 MG tablet TAKE ONE TABLET BY MOUTH DAILY   azelastine (ASTELIN) 0.1 %  nasal spray Place 1 spray into both nostrils 2 (two) times daily. Use in each nostril as directed   benazepril (LOTENSIN) 40 MG tablet TAKE 1 TABLET BY MOUTH DAILY AT 6 IN THE MORNING.   Blood Glucose Calibration (OT ULTRA/FASTTK CNTRL SOLN) SOLN Use as instructed to check blood sugars twice a day.   Blood Glucose Monitoring Suppl (ONE TOUCH ULTRA 2) w/Device KIT Use as instructed to check blood sugars twice a day.   gabapentin (NEURONTIN) 100 MG capsule TAKE ONE CAPSULE BY MOUTH THREE TIMES A DAY   ketoconazole (NIZORAL) 2 % shampoo Apply 1 application topically 2 (two) times a week. As needed for scalp dermatitis   Lancets Misc. (ONE TOUCH SURESOFT) MISC Use as instructed to check blood sugars twice a day.   Magnesium Gluconate 500 (27 MG) MG TABS Take 1 tablet by mouth every evening.    melatonin 5 MG TABS Take 1 tablet by mouth at bedtime.    MULTIPLE VITAMIN PO Take by mouth.   ONETOUCH ULTRA test strip Use as instructed to check blood sugars twice a day.   OZEMPIC, 1 MG/DOSE, 4 MG/3ML SOPN Inject 1 mg into the skin once a week.   saw palmetto 160 MG capsule Take 1 capsule (160 mg total) by mouth 2 (two) times daily.   sertraline (ZOLOFT) 50 MG tablet Take 1 tablet (50 mg total) by mouth daily.   sildenafil (REVATIO) 20 MG tablet TAKE 3-5 TABLETS BY MOUTH DAILY AS NEEDED   tamsulosin (FLOMAX) 0.4 MG CAPS capsule Take 2 capsules (0.8 mg total)  by mouth daily.   traZODone (DESYREL) 50 MG tablet TAKE 1.5 TABLET BY MOUTH EVERY NIGHT AT BEDTIME   vitamin B-12 (CYANOCOBALAMIN) 1000 MCG tablet Take by mouth.   zinc gluconate 50 MG tablet Take 50 mg by mouth daily.   No current facility-administered medications on file prior to visit.    Review of Systems  Constitutional:  Negative for activity change, appetite change, chills, diaphoresis, fatigue and fever.  HENT:  Negative for congestion and hearing loss.   Eyes:  Negative for visual disturbance.  Respiratory:  Negative for cough, chest tightness, shortness of breath and wheezing.   Cardiovascular:  Negative for chest pain, palpitations and leg swelling.  Gastrointestinal:  Negative for abdominal pain, constipation, diarrhea, nausea and vomiting.  Genitourinary:  Negative for dysuria, frequency and hematuria.  Musculoskeletal:  Negative for arthralgias and neck pain.  Skin:  Negative for rash.  Neurological:  Negative for dizziness, weakness, light-headedness, numbness and headaches.  Hematological:  Negative for adenopathy.  Psychiatric/Behavioral:  Negative for behavioral problems, dysphoric mood and sleep disturbance.    Per HPI unless specifically indicated above      Objective:    BP 128/66   Pulse 83   Ht _0  (1.778 m)   Wt 190 lb (86.2 kg)   SpO2 100%   BMI 27.26 kg/m   Wt Readings from Last 3 Encounters:  07/12/22 190 lb (86.2 kg)  06/14/22 185 lb (83.9 kg)  03/06/22 187 lb (84.8 kg)    Physical Exam Vitals and nursing note reviewed.  Constitutional:      General: He is not in acute distress.    Appearance: He is well-developed. He is not diaphoretic.     Comments: Well-appearing, comfortable, cooperative  HENT:     Head: Normocephalic and atraumatic.  Eyes:     General:        Right eye: No discharge.  Left eye: No discharge.     Conjunctiva/sclera: Conjunctivae normal.     Pupils: Pupils are equal, round, and reactive to light.  Neck:      Thyroid: No thyromegaly.     Vascular: No carotid bruit.  Cardiovascular:     Rate and Rhythm: Normal rate and regular rhythm.     Pulses: Normal pulses.     Heart sounds: Normal heart sounds. No murmur heard. Pulmonary:     Effort: Pulmonary effort is normal. No respiratory distress.     Breath sounds: Normal breath sounds. No wheezing or rales.  Abdominal:     General: Bowel sounds are normal. There is no distension.     Palpations: Abdomen is soft. There is no mass.     Tenderness: There is no abdominal tenderness.  Musculoskeletal:        General: No tenderness. Normal range of motion.     Cervical back: Normal range of motion and neck supple.     Right lower leg: No edema.     Left lower leg: No edema.     Comments: Upper / Lower Extremities: - Normal muscle tone, strength bilateral upper extremities 5/5, lower extremities 5/5  Lymphadenopathy:     Cervical: No cervical adenopathy.  Skin:    General: Skin is warm and dry.     Findings: No erythema or rash.  Neurological:     Mental Status: He is alert and oriented to person, place, and time.     Comments: Distal sensation intact to light touch all extremities  Psychiatric:        Mood and Affect: Mood normal.        Behavior: Behavior normal.        Thought Content: Thought content normal.     Comments: Well groomed, good eye contact, normal speech and thoughts     Diabetic Foot Exam - Simple   Simple Foot Form Diabetic Foot exam was performed with the following findings: Yes 07/12/2022  8:55 AM  Visual Inspection See comments: Yes Sensation Testing See comments: Yes Pulse Check Posterior Tibialis and Dorsalis pulse intact bilaterally: Yes Comments Mild sporadic areas of reduced sensation bilateral forefoot to monofilament. No ulceration. Mild dry skin areas. Toenails thickened.      Results for orders placed or performed in visit on 06/18/22  Vitamin B12  Result Value Ref Range   Vitamin B-12 1,122 (H) 200 -  1,100 pg/mL  TSH  Result Value Ref Range   TSH 3.19 0.40 - 4.50 mIU/L  Hemoglobin A1c  Result Value Ref Range   Hgb A1c MFr Bld 6.3 (H) <5.7 % of total Hgb   Mean Plasma Glucose 134 mg/dL   eAG (mmol/L) 7.4 mmol/L  Lipid panel  Result Value Ref Range   Cholesterol 156 <200 mg/dL   HDL 50 > OR = 40 mg/dL   Triglycerides 102 <150 mg/dL   LDL Cholesterol (Calc) 86 mg/dL (calc)   Total CHOL/HDL Ratio 3.1 <5.0 (calc)   Non-HDL Cholesterol (Calc) 106 <130 mg/dL (calc)  CBC with Differential/Platelet  Result Value Ref Range   WBC 3.2 (L) 3.8 - 10.8 Thousand/uL   RBC 3.91 (L) 4.20 - 5.80 Million/uL   Hemoglobin 13.3 13.2 - 17.1 g/dL   HCT 37.8 (L) 38.5 - 50.0 %   MCV 96.7 80.0 - 100.0 fL   MCH 34.0 (H) 27.0 - 33.0 pg   MCHC 35.2 32.0 - 36.0 g/dL   RDW 13.5 11.0 - 15.0 %  Platelets 122 (L) 140 - 400 Thousand/uL   MPV 10.3 7.5 - 12.5 fL   Neutro Abs 1,638 1,500 - 7,800 cells/uL   Lymphs Abs 810 (L) 850 - 3,900 cells/uL   Absolute Monocytes 429 200 - 950 cells/uL   Eosinophils Absolute 282 15 - 500 cells/uL   Basophils Absolute 42 0 - 200 cells/uL   Neutrophils Relative % 51.2 %   Total Lymphocyte 25.3 %   Monocytes Relative 13.4 %   Eosinophils Relative 8.8 %   Basophils Relative 1.3 %  COMPLETE METABOLIC PANEL WITH GFR  Result Value Ref Range   Glucose, Bld 120 (H) 65 - 99 mg/dL   BUN 20 7 - 25 mg/dL   Creat 1.47 (H) 0.70 - 1.35 mg/dL   eGFR 52 (L) > OR = 60 mL/min/1.70m   BUN/Creatinine Ratio 14 6 - 22 (calc)   Sodium 134 (L) 135 - 146 mmol/L   Potassium 4.9 3.5 - 5.3 mmol/L   Chloride 100 98 - 110 mmol/L   CO2 25 20 - 32 mmol/L   Calcium 9.5 8.6 - 10.3 mg/dL   Total Protein 7.3 6.1 - 8.1 g/dL   Albumin 4.0 3.6 - 5.1 g/dL   Globulin 3.3 1.9 - 3.7 g/dL (calc)   AG Ratio 1.2 1.0 - 2.5 (calc)   Total Bilirubin 0.5 0.2 - 1.2 mg/dL   Alkaline phosphatase (APISO) 101 35 - 144 U/L   AST 41 (H) 10 - 35 U/L   ALT 36 9 - 46 U/L      Assessment & Plan:   Problem List  Items Addressed This Visit     Alcohol dependence, uncomplicated (HCC)    Resumed alcohol 6 beer daily, goal to reduce      Benign prostatic hyperplasia with urinary obstruction    Improved BPH LUTS on Saw palmetto Now off tamsulosin Followed by Dr SBernardo Heater     DM (diabetes mellitus), type 2 with neurological complications (HCC)    Controlled DM A1c 6.3 Off Metformin Complications - peripheral neuropathy, other including hyperlipidemia, GERD, depression - increases risk of future cardiovascular complications poor glucose control due to reduced lifestyle diet/exercise with low energy mood and fatigue - Failed Victoza. OFF Glimepiride and NPH Insulin  Plan:  1. CONTINUE dose for Ozepmic up to 150mweekly injection (PAP program) 2. Encourage improved lifestyle - low carb, low sugar diet, reduce portion size, continue improving regular exercise 3. Check CBG, bring log to next visit for review - For DM Neuropathy - keep on Gabapentin - adjust dose - 30032mHS and maybe 100m60m instead of 100 TID - sinc waking up with neuropathy cramping symptoms - we can refer to Neurology next if need 4. Continue ASA, ACEi      Relevant Medications   rosuvastatin (CRESTOR) 5 MG tablet   Other Relevant Orders   Urine Microalbumin w/creat. ratio   Essential hypertension    Controlled HTN - some lower readings, will review in future if persistent low, may lower med No known complications     Plan:  1. Continue current BP regimen Benazepril 40mg70mly  2. Encourage improved lifestyle - low sodium diet, regular exercise 3. Continue monitor BP outside office, bring readings to next visit, if persistently >140/90 or new symptoms notify office sooner      Relevant Medications   rosuvastatin (CRESTOR) 5 MG tablet   GAD (generalized anxiety disorder)   Hyperlipidemia associated with type 2 diabetes mellitus (HCC) [place]  10-year ASCVD risk score (Arnett DK, et al., 2019) is: 26.4% Prior statin failed  Simvastatin (stopped due to LFTs, however chart review shows unlikely d/t statin since still elevated >1 year off statin, likely from prior alcohol history)  Plan: 1.Continue Rosuvastatin 74m daily - reconsider if causing muscle cramps in future 2. Continue ASA 872mfor primary ASCVD risk reduction 3. Encourage improved lifestyle - low carb/cholesterol, reduce portion size, continue improving regular exercise      Relevant Medications   rosuvastatin (CRESTOR) 5 MG tablet   Major depression, recurrent, chronic (HCC)   Overweight (BMI 25.0-29.9)   Thrombocytopenia ([place]  Other Visit Diagnoses     Annual physical exam    -  Primary   Pancytopenia ([place]      Need for shingles vaccine       Relevant Orders   Zoster Recombinant (Shingrix ) (Completed)   Needs flu shot       Relevant Orders   Flu Vaccine QUAD High Dose(Fluad) (Completed)   Acute left-sided low back pain with left-sided sciatica       Relevant Medications   predniSONE (DELTASONE) 20 MG tablet   baclofen (LIORESAL) 10 MG tablet      Updated Health Maintenance information Reviewed recent lab results with patient Encouraged improvement to lifestyle with diet and exercise Goal of weight loss    Urine test for diabetes protein check today  Flu shot today  Shingles shingrix dose #1 today repeat 2nd dose in 2-6 months.  Start taking Baclofen (Lioresal) 1091mmuscle relaxant) - start with half (cut) to one whole pill at night as needed for next 1-3 nights (may make you drowsy, caution with driving) see how it affects you, then if tolerated increase to one pill 2 to 3 times a day or (every 8 hours as needed)  Prednisone taper today  Meds ordered this encounter  Medications   predniSONE (DELTASONE) 20 MG tablet    Sig: Take daily with food. Start with 27m44m pills) x 2 days, then reduce to 40mg27mpills) x 2 days, then 20mg 48mill) x 3 days    Dispense:  13 tablet    Refill:  0   baclofen (LIORESAL) 10 MG  tablet    Sig: Take 0.5-1 tablets (5-10 mg total) by mouth 3 (three) times daily as needed for muscle spasms.    Dispense:  30 each    Refill:  1   rosuvastatin (CRESTOR) 5 MG tablet    Sig: Take 1 tablet (5 mg total) by mouth at bedtime.    Dispense:  90 tablet    Refill:  3    Add extra refills      Follow up plan: Return in about 6 months (around 01/10/2023) for 6 month fasting lab only then 1 week later Follow-up DM HTN Liver Labs.  AlexanNobie Put[facility] 07/12/2022, 8:34 AM

## 2022-07-12 NOTE — Patient Instructions (Addendum)
Thank you for coming to the office today.  Urine test for diabetes protein check today  Flu shot today  Shingles shingrix dose #1 today repeat 2nd dose in 2-6 months.  Start taking Baclofen (Lioresal) '10mg'$  (muscle relaxant) - start with half (cut) to one whole pill at night as needed for next 1-3 nights (may make you drowsy, caution with driving) see how it affects you, then if tolerated increase to one pill 2 to 3 times a day or (every 8 hours as needed)  Prednisone taper today  Recent Labs    06/19/22 0816  HGBA1C 6.3*   Okay to remain off Metformin  DUE for FASTING BLOOD WORK (no food or drink after midnight before the lab appointment, only water or coffee without cream/sugar on the morning of)  SCHEDULE "Lab Only" visit in the morning at the clinic for lab draw in 6 MONTHS   - Make sure Lab Only appointment is at about 1 week before your next appointment, so that results will be available  For Lab Results, once available within 2-3 days of blood draw, you can can log in to MyChart online to view your results and a brief explanation. Also, we can discuss results at next follow-up visit.   Please schedule a Follow-up Appointment to: Return in about 6 months (around 01/10/2023) for 6 month fasting lab only then 1 week later Follow-up DM HTN Liver Labs.  If you have any other questions or concerns, please feel free to call the office or send a message through [place]. You may also schedule an earlier appointment if necessary.  Additionally, you may be receiving a survey about your experience at our office within a few days to 1 week by e-mail or mail. We value your feedback.  Nobie Putnam, DO [place]

## 2022-07-12 NOTE — Progress Notes (Incomplete)
Subjective:    Patient ID: Richard Columbia Sr., male    DOB: December 16, 1955, 67 y.o.   MRN: [id number]  Richard SCHLOEMER Sr. is a 67 y.o. male presenting on 07/12/2022 for Annual Exam and Back Pain   HPI  Here for Annual Physical and Lab Review  Pancytopenia Low WBC on last lab, now this is repeat. And path smear showed pancytopenia. Pancytopenia identified on CBC, WBC 2.8-3, RBC 4, PLT 120 range, appears to have MCV elevated but pathology smear diagnosed pancytopenia and hypochromic RBC suggesting anemia He admits feeling cold at times. He takes B12 vitamin   CHRONIC DM, Type 2 with DM Neuropathy / BMI >24 Hypertension A1c 6.3 - Today reports he continues to do very well at this time CBGs: Checks CBGs 1-3x daily - avg 130-160, rarely low Meds: - Ozempic 82m weekly (PAP program) - Remains off Metformin - Failed Victoza in past, OFF Glimepiride, OFF NPH Insulin Reports good compliance. Tolerating well w/o side-effects Currently on ACEi Lifestyle: - Diet (improved diet overall still - reduced portions) - Exercise (increased activity and exercise) Chronic issue with known, Bilateral feet, mild loss of sensitivity Taking Vitamin B12 Magnesium, MVI, Melatonin. Admits tingling and neuropathy in feet bilateral. Takes OTC medicine for neuropathy. Still has cramping pain in both calves - worse in AM when wakes up otherwise not bad. On gabapentin 1041mTID limited relief - Last DM Eye exam by Dr BeGloriann Loaneed copy of report. Denies hypoglycemia, polyuria, visual changes or tingling.    Sinusitis, allergic Chronic year round, tried allergy pills and OTC nasal sprays but has rebound.   Major Depression, recurrent in remission / Insomnia Alcohol Dependence in remission Reports his mood has been good overall without new concerns. Continues on SSRI Sertraline 504maily with great results Continues on Trazodone 1.5 pills for 36m71mghtly for insomnia.   HYPERLIPIDEMIA: - Reports no concerns. Last  lipid panel 06/2021 major improved LDL to 71 from previous 124 pre statin On Rosuvastatin 5mg 19mhtly not causing side effects   CHRONIC HTN: Reports no concerns, some lower BP lately Current Meds - Benazepril 40mg 23my   Reports good compliance, took meds today. Tolerating well, w/o complaints. Denies CP, dyspnea, HA, edema, dizziness / lightheadedness  Alcohol dependence, uncomplicated History of heavy drinking with liquor He has resolved that and now reduced significantly 6 beers per day now he is retired.   Additional issue  Left sided low back pain sciatica Reports stiffness soreness with pain and radiating sciatica.   Health Maintenance:  UTD Pneumonia Vaccine 02/2022  TDap 2015  Due for Flu and Shingles vaccine today   Last colonoscopy approx 2008. He has declined in interval due to ins coverage and other concerns. - Cologuard done 12/28/19 - negative, good for 3 years, next 12/2022  PSA 0.8, negative      03/06/2022    8:09 AM 09/20/2021    8:08 AM 06/14/2021    9:26 AM  Depression screen PHQ 2/9  Decreased Interest 0 0 0  Down, Depressed, Hopeless 0 0 0  PHQ - 2 Score 0 0 0  Altered sleeping  0 0  Tired, decreased energy  1 0  Change in appetite  0 0  Feeling bad or failure about yourself   0 0  Trouble concentrating  0 0  Moving slowly or fidgety/restless  0 0  Suicidal thoughts  0 0  PHQ-9 Score  1 0  Difficult doing work/chores  Not difficult at all  Not difficult at all    Past Medical History:  Diagnosis Date  . Anxiety   . Cholelithiasis   . DM (diabetes mellitus), type 2 with neurological complications ([place])   . GERD (gastroesophageal reflux disease)   . Hyperlipidemia   . Pancytopenia ([place])   . Seasonal allergic rhinitis due to other allergic trigger    Past Surgical History:  Procedure Laterality Date  . Lung Collapse  67 years old.    Social History   Socioeconomic History  . Marital status: Married    Spouse name: Building surveyor  .  Number of children: 2  . Years of education: Not on file  . Highest education level: Not on file  Occupational History  . Occupation: retired  Tobacco Use  . Smoking status: Former    Years: 10.00    Types: Cigarettes    Quit date: 08/08/1999    Years since quitting: 22.9  . Smokeless tobacco: Former  Media planner  . Vaping Use: Never used  Substance and Sexual Activity  . Alcohol use: Yes    Alcohol/week: 6.0 standard drinks of alcohol    Types: 6 Cans of beer per week  . Drug use: No  . Sexual activity: Yes  Other Topics Concern  . Not on file  Social History Narrative  . Not on file   Social Determinants of Health   Financial Resource Strain: Low Risk  (03/06/2022)   Overall Financial Resource Strain (CARDIA)   . Difficulty of Paying Living Expenses: Not hard at all  Food Insecurity: No Food Insecurity (03/06/2022)   Hunger Vital Sign   . Worried About Charity fundraiser in the Last Year: Never true   . Ran Out of Food in the Last Year: Never true  Transportation Needs: No Transportation Needs (03/06/2022)   PRAPARE - Transportation   . Lack of Transportation (Medical): No   . Lack of Transportation (Non-Medical): No  Physical Activity: Sufficiently Active (03/06/2022)   Exercise Vital Sign   . Days of Exercise per Week: 7 days   . Minutes of Exercise per Session: 30 min  Stress: No Stress Concern Present (03/06/2022)   [place]   . Feeling of Stress : Not at all  Social Connections: Moderately Integrated (03/06/2022)   Social Connection and Isolation Panel [NHANES]   . Frequency of Communication with Friends and Family: More than three times a week   . Frequency of Social Gatherings with Friends and Family: More than three times a week   . Attends Religious Services: Never   . Active Member of Clubs or Organizations: Yes   . Attends Archivist Meetings: Never   . Marital Status: Married   Human resources officer Violence: Not At Risk (03/06/2022)   Humiliation, Afraid, Rape, and Kick questionnaire   . Fear of Current or Ex-Partner: No   . Emotionally Abused: No   . Physically Abused: No   . Sexually Abused: No   Family History  Problem Relation Age of Onset  . Diabetes Father        complications of DM caused death  . Heart attack Father   . Diabetes Sister   . Cancer Mother        lung  . Lung cancer Mother    Current Outpatient Medications on File Prior to Visit  Medication Sig  . allopurinol (ZYLOPRIM) 300 MG tablet TAKE ONE TABLET BY MOUTH DAILY  . azelastine (ASTELIN) 0.1 %  nasal spray Place 1 spray into both nostrils 2 (two) times daily. Use in each nostril as directed  . benazepril (LOTENSIN) 40 MG tablet TAKE 1 TABLET BY MOUTH DAILY AT 6 IN THE MORNING.  . Blood Glucose Calibration (OT ULTRA/FASTTK CNTRL SOLN) SOLN Use as instructed to check blood sugars twice a day.  . Blood Glucose Monitoring Suppl (ONE TOUCH ULTRA 2) w/Device KIT Use as instructed to check blood sugars twice a day.  . gabapentin (NEURONTIN) 100 MG capsule TAKE ONE CAPSULE BY MOUTH THREE TIMES A DAY  . ketoconazole (NIZORAL) 2 % shampoo Apply 1 application topically 2 (two) times a week. As needed for scalp dermatitis  . Lancets Misc. (ONE TOUCH SURESOFT) MISC Use as instructed to check blood sugars twice a day.  . Magnesium Gluconate 500 (27 MG) MG TABS Take 1 tablet by mouth every evening.   . melatonin 5 MG TABS Take 1 tablet by mouth at bedtime.   . MULTIPLE VITAMIN PO Take by mouth.  Glory Rosebush ULTRA test strip Use as instructed to check blood sugars twice a day.  Marland Kitchen OZEMPIC, 1 MG/DOSE, 4 MG/3ML SOPN Inject 1 mg into the skin once a week.  . saw palmetto 160 MG capsule Take 1 capsule (160 mg total) by mouth 2 (two) times daily.  . sertraline (ZOLOFT) 50 MG tablet Take 1 tablet (50 mg total) by mouth daily.  . sildenafil (REVATIO) 20 MG tablet TAKE 3-5 TABLETS BY MOUTH DAILY AS NEEDED  .  tamsulosin (FLOMAX) 0.4 MG CAPS capsule Take 2 capsules (0.8 mg total) by mouth daily.  . traZODone (DESYREL) 50 MG tablet TAKE 1.5 TABLET BY MOUTH EVERY NIGHT AT BEDTIME  . vitamin B-12 (CYANOCOBALAMIN) 1000 MCG tablet Take by mouth.  . zinc gluconate 50 MG tablet Take 50 mg by mouth daily.   No current facility-administered medications on file prior to visit.    Review of Systems  Constitutional:  Negative for activity change, appetite change, chills, diaphoresis, fatigue and fever.  HENT:  Negative for congestion and hearing loss.   Eyes:  Negative for visual disturbance.  Respiratory:  Negative for cough, chest tightness, shortness of breath and wheezing.   Cardiovascular:  Negative for chest pain, palpitations and leg swelling.  Gastrointestinal:  Negative for abdominal pain, constipation, diarrhea, nausea and vomiting.  Genitourinary:  Negative for dysuria, frequency and hematuria.  Musculoskeletal:  Negative for arthralgias and neck pain.  Skin:  Negative for rash.  Neurological:  Negative for dizziness, weakness, light-headedness, numbness and headaches.  Hematological:  Negative for adenopathy.  Psychiatric/Behavioral:  Negative for behavioral problems, dysphoric mood and sleep disturbance.    Per HPI unless specifically indicated above      Objective:    BP 128/66   Pulse 83   Ht _0  (1.778 m)   Wt 190 lb (86.2 kg)   SpO2 100%   BMI 27.26 kg/m   Wt Readings from Last 3 Encounters:  07/12/22 190 lb (86.2 kg)  06/14/22 185 lb (83.9 kg)  03/06/22 187 lb (84.8 kg)    Physical Exam Vitals and nursing note reviewed.  Constitutional:      General: He is not in acute distress.    Appearance: He is well-developed. He is not diaphoretic.     Comments: Well-appearing, comfortable, cooperative  HENT:     Head: Normocephalic and atraumatic.  Eyes:     General:        Right eye: No discharge.  Left eye: No discharge.     Conjunctiva/sclera: Conjunctivae  normal.     Pupils: Pupils are equal, round, and reactive to light.  Neck:     Thyroid: No thyromegaly.     Vascular: No carotid bruit.  Cardiovascular:     Rate and Rhythm: Normal rate and regular rhythm.     Pulses: Normal pulses.     Heart sounds: Normal heart sounds. No murmur heard. Pulmonary:     Effort: Pulmonary effort is normal. No respiratory distress.     Breath sounds: Normal breath sounds. No wheezing or rales.  Abdominal:     General: Bowel sounds are normal. There is no distension.     Palpations: Abdomen is soft. There is no mass.     Tenderness: There is no abdominal tenderness.  Musculoskeletal:        General: No tenderness. Normal range of motion.     Cervical back: Normal range of motion and neck supple.     Right lower leg: No edema.     Left lower leg: No edema.     Comments: Upper / Lower Extremities: - Normal muscle tone, strength bilateral upper extremities 5/5, lower extremities 5/5  Lymphadenopathy:     Cervical: No cervical adenopathy.  Skin:    General: Skin is warm and dry.     Findings: No erythema or rash.  Neurological:     Mental Status: He is alert and oriented to person, place, and time.     Comments: Distal sensation intact to light touch all extremities  Psychiatric:        Mood and Affect: Mood normal.        Behavior: Behavior normal.        Thought Content: Thought content normal.     Comments: Well groomed, good eye contact, normal speech and thoughts     Diabetic Foot Exam - Simple   Simple Foot Form Diabetic Foot exam was performed with the following findings: Yes 07/12/2022  8:55 AM  Visual Inspection See comments: Yes Sensation Testing See comments: Yes Pulse Check Posterior Tibialis and Dorsalis pulse intact bilaterally: Yes Comments Mild sporadic areas of reduced sensation bilateral forefoot to monofilament. No ulceration. Mild dry skin areas. Toenails thickened.      Results for orders placed or performed in visit  on 06/18/22  Vitamin B12  Result Value Ref Range   Vitamin B-12 1,122 (H) 200 - 1,100 pg/mL  TSH  Result Value Ref Range   TSH 3.19 0.40 - 4.50 mIU/L  Hemoglobin A1c  Result Value Ref Range   Hgb A1c MFr Bld 6.3 (H) <5.7 % of total Hgb   Mean Plasma Glucose 134 mg/dL   eAG (mmol/L) 7.4 mmol/L  Lipid panel  Result Value Ref Range   Cholesterol 156 <200 mg/dL   HDL 50 > OR = 40 mg/dL   Triglycerides 102 <150 mg/dL   LDL Cholesterol (Calc) 86 mg/dL (calc)   Total CHOL/HDL Ratio 3.1 <5.0 (calc)   Non-HDL Cholesterol (Calc) 106 <130 mg/dL (calc)  CBC with Differential/Platelet  Result Value Ref Range   WBC 3.2 (L) 3.8 - 10.8 Thousand/uL   RBC 3.91 (L) 4.20 - 5.80 Million/uL   Hemoglobin 13.3 13.2 - 17.1 g/dL   HCT 37.8 (L) 38.5 - 50.0 %   MCV 96.7 80.0 - 100.0 fL   MCH 34.0 (H) 27.0 - 33.0 pg   MCHC 35.2 32.0 - 36.0 g/dL   RDW 13.5 11.0 - 15.0 %  Platelets 122 (L) 140 - 400 Thousand/uL   MPV 10.3 7.5 - 12.5 fL   Neutro Abs 1,638 1,500 - 7,800 cells/uL   Lymphs Abs 810 (L) 850 - 3,900 cells/uL   Absolute Monocytes 429 200 - 950 cells/uL   Eosinophils Absolute 282 15 - 500 cells/uL   Basophils Absolute 42 0 - 200 cells/uL   Neutrophils Relative % 51.2 %   Total Lymphocyte 25.3 %   Monocytes Relative 13.4 %   Eosinophils Relative 8.8 %   Basophils Relative 1.3 %  COMPLETE METABOLIC PANEL WITH GFR  Result Value Ref Range   Glucose, Bld 120 (H) 65 - 99 mg/dL   BUN 20 7 - 25 mg/dL   Creat 1.47 (H) 0.70 - 1.35 mg/dL   eGFR 52 (L) > OR = 60 mL/min/1.88m   BUN/Creatinine Ratio 14 6 - 22 (calc)   Sodium 134 (L) 135 - 146 mmol/L   Potassium 4.9 3.5 - 5.3 mmol/L   Chloride 100 98 - 110 mmol/L   CO2 25 20 - 32 mmol/L   Calcium 9.5 8.6 - 10.3 mg/dL   Total Protein 7.3 6.1 - 8.1 g/dL   Albumin 4.0 3.6 - 5.1 g/dL   Globulin 3.3 1.9 - 3.7 g/dL (calc)   AG Ratio 1.2 1.0 - 2.5 (calc)   Total Bilirubin 0.5 0.2 - 1.2 mg/dL   Alkaline phosphatase (APISO) 101 35 - 144 U/L   AST 41  (H) 10 - 35 U/L   ALT 36 9 - 46 U/L      Assessment & Plan:   Problem List Items Addressed This Visit     Alcohol dependence, uncomplicated (HCC)   Benign prostatic hyperplasia with urinary obstruction   DM (diabetes mellitus), type 2 with neurological complications (HCC)   Relevant Medications   rosuvastatin (CRESTOR) 5 MG tablet   Other Relevant Orders   Urine Microalbumin w/creat. ratio   Essential hypertension   Relevant Medications   rosuvastatin (CRESTOR) 5 MG tablet   GAD (generalized anxiety disorder)   Hyperlipidemia associated with type 2 diabetes mellitus (HCC)   Relevant Medications   rosuvastatin (CRESTOR) 5 MG tablet   Major depression, recurrent, chronic (HCC)   Overweight (BMI 25.0-29.9)   [place]rombocytopenia ([place]   Other Visit Diagnoses     Annual physical exam    -  Primary   Pancytopenia ([place]       Need for shingles vaccine       Relevant Orders   Zoster Recombinant (Shingrix )   Needs flu shot       Relevant Orders   Flu Vaccine QUAD High Dose(Fluad)   Acute left-sided low back pain with left-sided sciatica       Relevant Medications   predniSONE (DELTASONE) 20 MG tablet   baclofen (LIORESAL) 10 MG tablet      Updated Health Maintenance information Reviewed recent lab results with patient Encouraged improvement to lifestyle with diet and exercise Goal of weight loss    Urine test for diabetes protein check today  Flu shot today  Shingles shingrix dose #1 today repeat 2nd dose in 2-6 months.  Start taking Baclofen (Lioresal) 161m(muscle relaxant) - start with half (cut) to one whole pill at night as needed for next 1-3 nights (may make you drowsy, caution with driving) see how it affects you, then if tolerated increase to one pill 2 to 3 times a day or (every 8 hours as needed)  Prednisone taper today  Meds ordered this  encounter  Medications  . predniSONE (DELTASONE) 20 MG tablet    Sig: Take daily with food. Start with 78m (3 pills)  x 2 days, then reduce to 483m(2 pills) x 2 days, then 203m1 pill) x 3 days    Dispense:  13 tablet    Refill:  0  . baclofen (LIORESAL) 10 MG tablet    Sig: Take 0.5-1 tablets (5-10 mg total) by mouth 3 (three) times daily as needed for muscle spasms.    Dispense:  30 each    Refill:  1  . rosuvastatin (CRESTOR) 5 MG tablet    Sig: Take 1 tablet (5 mg total) by mouth at bedtime.    Dispense:  90 tablet    Refill:  3    Add extra refills      Follow up plan: Return in about 6 months (around 01/10/2023) for 6 month fasting lab only then 1 week later Follow-up DM HTN Liver Labs.  AleNobie PutnamO [facility] 07/12/2022, 8:34 AM

## 2022-07-13 LAB — MICROALBUMIN / CREATININE URINE RATIO
Creatinine, Urine: 59 mg/dL (ref 20–320)
Microalb Creat Ratio: 25 mcg/mg creat (ref <30)
Microalb, Ur: 1.5 mg/dL

## 2022-07-13 NOTE — Assessment & Plan Note (Signed)
Controlled HTN - some lower readings, will review in future if persistent low, may lower med No known complications     Plan:  1. Continue current BP regimen Benazepril '40mg'$  daily  2. Encourage improved lifestyle - low sodium diet, regular exercise 3. Continue monitor BP outside office, bring readings to next visit, if persistently >140/90 or new symptoms notify office sooner

## 2022-07-13 NOTE — Assessment & Plan Note (Signed)
The 10-year ASCVD risk score (Arnett DK, et al., 2019) is: 26.4% Prior statin failed Simvastatin (stopped due to LFTs, however chart review shows unlikely d/t statin since still elevated >1 year off statin, likely from prior alcohol history)  Plan: 1.Continue Rosuvastatin '5mg'$  daily - reconsider if causing muscle cramps in future 2. Continue ASA '81mg'$  for primary ASCVD risk reduction 3. Encourage improved lifestyle - low carb/cholesterol, reduce portion size, continue improving regular exercise

## 2022-07-13 NOTE — Assessment & Plan Note (Signed)
Controlled DM A1c 6.3 Off Metformin Complications - peripheral neuropathy, other including hyperlipidemia, GERD, depression - increases risk of future cardiovascular complications poor glucose control due to reduced lifestyle diet/exercise with low energy mood and fatigue - Failed Victoza. OFF Glimepiride and NPH Insulin  Plan:  1. CONTINUE dose for Ozepmic up to '1mg'$  weekly injection (PAP program) 2. Encourage improved lifestyle - low carb, low sugar diet, reduce portion size, continue improving regular exercise 3. Check CBG, bring log to next visit for review - For DM Neuropathy - keep on Gabapentin - adjust dose - '300mg'$  QHS and maybe '100mg'$  AM instead of 100 TID - sinc waking up with neuropathy cramping symptoms - we can refer to Neurology next if need 4. Continue ASA, ACEi

## 2022-07-13 NOTE — Assessment & Plan Note (Signed)
Resumed alcohol 6 beer daily, goal to reduce

## 2022-07-13 NOTE — Assessment & Plan Note (Signed)
Improved BPH LUTS on Saw palmetto Now off tamsulosin Followed by Dr Bernardo Heater

## 2022-07-15 ENCOUNTER — Other Ambulatory Visit: Payer: Self-pay | Admitting: Family Medicine

## 2022-07-15 DIAGNOSIS — I10 Essential (primary) hypertension: Secondary | ICD-10-CM

## 2022-07-15 DIAGNOSIS — E1149 Type 2 diabetes mellitus with other diabetic neurological complication: Secondary | ICD-10-CM

## 2022-07-15 DIAGNOSIS — F339 Major depressive disorder, recurrent, unspecified: Secondary | ICD-10-CM

## 2022-07-16 NOTE — Telephone Encounter (Signed)
Requested Prescriptions  Pending Prescriptions Disp Refills   gabapentin (NEURONTIN) 100 MG capsule [Pharmacy Med Name: GABAPENTIN 100 MG CAPSULE] 270 capsule 3    Sig: TAKE ONE CAPSULE BY MOUTH THREE TIMES A DAY     Neurology: Anticonvulsants - gabapentin Failed - 07/15/2022 12:08 PM      Failed - Cr in normal range and within 360 days    Creat  Date Value Ref Range Status  06/19/2022 1.47 (H) 0.70 - 1.35 mg/dL Final   Creatinine, Urine  Date Value Ref Range Status  07/12/2022 59 20 - 320 mg/dL Final         Passed - Completed PHQ-2 or PHQ-9 in the last 360 days      Passed - Valid encounter within last 12 months    Recent Outpatient Visits           4 days ago Annual physical exam   [place], [name], DO   1 month ago DM (diabetes mellitus), type 2 with neurological complications [facility])   [facility] Delles, Grayland Ormond A, RPH-CPP   9 months ago DM (diabetes mellitus), type 2 with neurological complications ([place])   [place], [name], DO   1 year ago Annual physical exam   [facility] [name], DO   1 year ago DM (diabetes mellitus), type 2 with neurological complications [facility] [place])   [place], DO       Future Appointments             In 6 months [place], [name], DO [facility], [place]   In 11 months [name], [name], MD [facility] [place]             traZODone ([place]) 50 MG tablet [Pharmacy Med Name: traZODone 50 MG TABLET] 135 tablet 1    Sig: TAKE 1 AND 1/2 TABLET BY MOUTH AT BEDTIME     Psychiatry: Antidepressants - Serotonin Modulator Passed - 07/15/2022 12:08 PM      Passed - Completed PHQ-2 or PHQ-9 in the last 360 days      Passed - Valid encounter within last 6 months    Recent Outpatient Visits           4 days ago Annual physical exam   [place], [name], DO   1 month ago DM (diabetes mellitus), type 2 with neurological complications [facility])   [facility] Delles, Grayland Ormond A, RPH-CPP   9 months ago DM (diabetes mellitus), type 2 with neurological complications [facility])   [place], DO   1 year ago Annual physical exam   [facility] [name], DO   1 year ago DM (diabetes mellitus), type 2 with neurological complications [facility])   [facility] [place], [name], DO       Future Appointments             In 6 months [place], [name], [facility], [place]   In 11 months [name], [name], MD [facility] [place]             benazepril (LOTENSIN) 40 MG tablet [Pharmacy Med Name: BENAZEPRIL HCL 40 MG TABLET] 90 tablet 1    Sig: TAKE ONE TABLET BY MOUTH EVERY MORNING AT 6:00 A.M.     Cardiovascular:  ACE Inhibitors Failed - 07/15/2022 12:08 PM      Failed - Cr in normal range and within 180 days    Creat  Date Value Ref Range Status  06/19/2022 1.47 (H) 0.70 - 1.35 mg/dL Final   Creatinine, Urine  Date Value Ref Range Status  07/12/2022 59 20 - 320 mg/dL Final         Passed - K in normal range and within 180 days    Potassium  Date Value Ref Range Status  06/19/2022 4.9 3.5 - 5.3 mmol/L Final         Passed - Patient is not pregnant      Passed - Last BP in normal range    BP Readings from Last 1 Encounters:  07/12/22 128/66         Passed - Valid encounter within last 6 months    Recent Outpatient Visits           4 days ago Annual physical exam   [place], [name], DO   1 month ago DM (diabetes mellitus), type 2 with neurological complications [facility])   [place], Grayland Ormond A, RPH-CPP   9 months ago DM (diabetes mellitus), type 2 with neurological complications [facility])   [place], DO   1 year ago Annual physical exam   [facility] [name], DO   1 year ago DM (diabetes mellitus), type 2 with neurological complications [facility])   [facility] [place], [name], DO       Future Appointments             In 6 months [place], [name], DO [facility], [place]   In 11 months [name], [name], [place]

## 2022-07-30 ENCOUNTER — Ambulatory Visit: Payer: Medicare HMO | Admitting: Pharmacist

## 2022-07-30 DIAGNOSIS — E1149 Type 2 diabetes mellitus with other diabetic neurological complication: Secondary | ICD-10-CM

## 2022-07-30 MED ORDER — OZEMPIC (1 MG/DOSE) 4 MG/3ML ~~LOC~~ SOPN: 1.0000 mg | PEN_INJECTOR | SUBCUTANEOUS | 0 refills | Status: AC

## 2022-07-30 NOTE — Progress Notes (Signed)
07/30/2022 Name: Richard Render Bellomo Sr. MRN: [id number] DOB: 08-12-1955  Chief Complaint  Patient presents with   Medication Assistance   Medication Management    Richard FITZWATER Sr. is a 67 y.o. year old male who presented for a telephone visit.   They were referred to the pharmacist by their PCP for assistance in managing diabetes and medication access.    Subjective:  Perform chart review. Patient seen for Office Visit with PCP on 07/12/2022 for annual exam and back pain. Provider advised patient: - Shingles shingrix dose #1 today repeat 2nd dose in 2-6 months. - Start taking Baclofen (Lioresal) '10mg'$  (muscle relaxant) - start with half (cut) to one whole pill at night as needed for next 1-3 nights (may make you drowsy, caution with driving) see how it affects you, then if tolerated increase to one pill 2 to 3 times a day or (every 8 hours as needed) - Prednisone taper today  Today patient reports back pain was significantly improved with course of prednisone and taking baclofen as needed, but that pain has now returned. Reports will contact PCP regarding his back pain    Care Team: Primary Care Provider: [name], DO ; Next Scheduled Visit: 01/18/2023 Urology: [name], MD; Next Scheduled Visit: 06/14/2023  Medication Access/Adherence  Current Pharmacy:  [facility] ([place] [place], [place] [address] Phone: [phone] Fax: [phone]  [facility] [id number] - [place], [place] - [address] [place] [address] Phone: [phone] Fax: [phone]   Patient reports affordability concerns with their medications: No  Patient reports access/transportation concerns to their pharmacy: No  Patient reports adherence concerns with their medications:  No      Diabetes:   Current medications: Ozempic 1 mg weekly  Medications tried in the past:  metformin  Reports continues to tolerate Ozempic well   Current glucose readings: reports recent fasting readings ranging 96-130  Denies symptoms of hypoglycemia  Reports having regular well-balanced meals, while controlling carbohydrate portion sizes   Note since restarted drinking, cut back from ~12 cans of beer/day to currently drinking ~6 cans/day    Current physical activity: reports stays active throughout the day   Statin: rosuvastatin 5 mg daily   Current medication access support: Collaborating with [place] Simcox for assistance with re-enrollment for Ozempic patient assistance program through [employer]  - From review of chart, note [facility] mailed application to patient on 12/8 - Today patient reports that he completed and mailed application back to [place] on 07/19/2022 - Reports he has only a 2 week supply of Ozempic remaining     Objective:  Lab Results  Component Value Date   HGBA1C 6.3 (H) 06/19/2022    Lab Results  Component Value Date   CREATININE 1.47 (H) 06/19/2022   BUN 20 06/19/2022   NA 134 (L) 06/19/2022   K 4.9 06/19/2022   CL 100 06/19/2022   CO2 25 06/19/2022    Lab Results  Component Value Date   CHOL 156 06/19/2022   HDL 50 06/19/2022   LDLCALC 86 06/19/2022   TRIG 102 06/19/2022   CHOLHDL 3.1 06/19/2022    Medications Reviewed Today     Reviewed by Rennis Petty, RPH-CPP (Pharmacist) on 07/30/22 at 1139  Med List Status: <None>   Medication Order Taking? Sig Documenting Provider Last Dose Status Informant  allopurinol (ZYLOPRIM) 300 MG  tablet [id number]  TAKE ONE TABLET BY MOUTH DAILY [name], [name], MD  Active   azelastine (ASTELIN) 0.1 % nasal spray [id number]  Place 1 spray into both nostrils 2 (two) times daily. Use in each nostril as directed [name], DO  Active   benazepril (LOTENSIN) 40 MG tablet [id number] Yes TAKE ONE TABLET BY MOUTH EVERY MORNING AT 6:00 A.Hortencia Conradi, [name], DO  Taking Active   Blood Glucose Calibration (OT ULTRA/FASTTK CNTRL SOLN) SOLN [id number]  Use as instructed to check blood sugars twice a day. [name], [name], DO  Active   Blood Glucose Monitoring Suppl (ONE TOUCH ULTRA 2) w/Device KIT [id number]  Use as instructed to check blood sugars twice a day. [name], DO  Active   gabapentin (NEURONTIN) 100 MG capsule [id number]  TAKE ONE CAPSULE BY MOUTH THREE TIMES A DAY [name], Alexander Lenna Sciara, DO  Active   ketoconazole (NIZORAL) 2 % shampoo [id number]  Apply 1 application topically 2 (two) times a week. As needed for scalp dermatitis [name], [name], DO  Active   Lancets Misc. ([place]) MISC [id number]  Use as instructed to check blood sugars twice a day. [name], DO  Active   Magnesium Gluconate 500 (27 MG) MG TABS [id number]  Take 1 tablet by mouth every evening.  [name], MD  Active            Med Note [facility], [name]   Sat Nov 06, 2014 11:01 AM) Received from: [employer]  melatonin 5 MG TABS [id number]  Take 1 tablet by mouth at bedtime.  [provider]  Active   MULTIPLE VITAMIN PO [id number]  Take by mouth. [name], MD  Active            Med Note [facility] Pall, [name]   Sat Nov 06, 2014 11:01 AM) Received from: [place] test strip [id number]  Use as instructed to check blood sugars twice a day. [name], DO  Active   OZEMPIC, 1 MG/DOSE, 4 MG/3ML Bonney Aid [id number] Yes Inject 1 mg into the skin once a week. [name], DO Taking Active   rosuvastatin (CRESTOR) 5 MG tablet [id number] Yes Take 1 tablet (5 mg total) by mouth at bedtime. [name], DO Taking Active   saw palmetto 160 MG capsule [id number]  Take 1 capsule (160 mg total) by mouth 2 (two) times daily. [name], [name], DO  Active   sertraline (ZOLOFT) 50 MG tablet [id number]  Take 1 tablet (50 mg total) by  mouth daily. [name], DO  Active   sildenafil (REVATIO) 20 MG tablet [id number]  TAKE 3-5 TABLETS BY MOUTH DAILY AS NEEDED [name], [name], MD  Active   tamsulosin (FLOMAX) 0.4 MG CAPS capsule [id number]  Take 2 capsules (0.8 mg total) by mouth daily. [name], MD  Active   traZODone (DESYREL) 50 MG tablet [id number]  TAKE 1 AND 1/2 TABLET BY MOUTH AT BEDTIME [name], [name], DO  Active   vitamin B-12 (CYANOCOBALAMIN) 1000 MCG tablet [id number]  Take by mouth. [provider]  Active   zinc gluconate 50 MG tablet [id number]  Take 50 mg by mouth daily. [provider]  Active               Assessment/Plan:   Encourage patient to contact office to schedule second dose of Shingrix vaccine  Diabetes: - Currently controlled -  Again discuss impact of alcohol consumption on blood sugar control Reports since restarted drinking, cut back from ~12 cans of beer/day to currently drinking ~6-8 cans/day Encourage patient to continue cut back on alcohol consumption with a goal of totally stop drinking Declined need for further support at this time - Discuss importance of having regular well-balanced meals, while controlling carbohydrate portion sizes - Encourage patient to continue to monitor home blood sugar, keep log of results and have this record to review at medical appointments Patient to contact office or clinical pharmacist sooner if needed for readings outside of established parameters or new symptoms - Patient to follow up with PCP today regarding return of back pain symptoms - Collaborating with [place] Simcox for assistance with re-enrollment for Ozempic patient assistance program through [employer] Today patient reports having only a 2 week supply of Ozempic remaining and mailed re-enrollment application back to [place] on 07/19/2022 CPP will send renewal for 30 day supply Rx for Ozempic 1 mg weekly to local pharmacy [facility])  for patient as requested, while waiting on re-enrollment/shipment from [employer] - Will collaborate with PCP        Follow Up Plan: Clinical Pharmacist to follow up with patient by telephone on 08/27/2022 at 11:30 am   Wallace Cullens, PharmD, [place], [place] [phone]

## 2022-07-30 NOTE — Patient Instructions (Signed)
Goals Addressed             This Visit's Progress    Pharmacy Goals       Our goal A1c is less than 7%. This corresponds with fasting sugars less than 130 and 2 hour after meal sugars less than 180. Please continue keep a log of results when you check your blood sugar at home.  Feel free to call me with any questions or concerns. I look forward to our next call!  Merna Baldi Nakhi Choi, PharmD, BCACP Clinical Pharmacist [facility] [place] [phone]        

## 2022-07-31 ENCOUNTER — Encounter: Payer: Self-pay | Admitting: Family Medicine

## 2022-08-09 DIAGNOSIS — E113393 Type 2 diabetes mellitus with moderate nonproliferative diabetic retinopathy without macular edema, bilateral: Secondary | ICD-10-CM | POA: Diagnosis not present

## 2022-08-10 ENCOUNTER — Telehealth: Payer: Self-pay | Admitting: Pharmacy Technician

## 2022-08-10 DIAGNOSIS — Z596 Low income: Secondary | ICD-10-CM

## 2022-08-10 NOTE — Progress Notes (Signed)
[facility])                                            [place] Team    08/10/2022  Richard BARREIRO Sr. [birth year] [id number]  Received both patient and provider portion(s) of patient assistance application(s) for Ozempic. Faxed completed application and required documents into [employer].    Richard Day, [place]  [phone]

## 2022-08-22 ENCOUNTER — Telehealth: Payer: Self-pay | Admitting: Pharmacy Technician

## 2022-08-22 DIAGNOSIS — Z596 Low income: Secondary | ICD-10-CM

## 2022-08-22 NOTE — Progress Notes (Signed)
[facility])                                            [place] Team    08/22/2022  Faiyaz Bloom Polan Sr. 10-31-1955 [id number]  Care coordination call placed to [place] in regard to [employer] application.   Spoke to Mina who informs patient is APPROVED 08/17/22-07/09/23. Medication will automatically fill and ship to provider's address on file based on last fill date in 2023. Patient may call [place] at [phone] if shipment has not arrived and patient does not have sufficient supply.  Nanie Dunkleberger P. Anastasha Ortez, [place]  [phone]

## 2022-08-27 ENCOUNTER — Ambulatory Visit: Payer: Medicare HMO | Admitting: Pharmacist

## 2022-08-27 DIAGNOSIS — E1149 Type 2 diabetes mellitus with other diabetic neurological complication: Secondary | ICD-10-CM

## 2022-08-27 NOTE — Progress Notes (Unsigned)
08/27/2022 Name: Richard Render Nayak Sr. MRN: [id number] DOB: 03-06-1956  Chief Complaint  Patient presents with   Medication Management   Medication Assistance    Richard RABBITT Sr. is a 67 y.o. year old male who presented for a telephone visit.   They were referred to the pharmacist by their PCP for assistance in managing diabetes and medication access.    Subjective:  Care Team: Primary Care Provider: [name], DO ; Next Scheduled Visit: 01/18/2023 Urology: [name], MD; Next Scheduled Visit: 06/14/2023  Medication Access/Adherence  Current Pharmacy:  [facility] ([place]) [place], [place] [address] Phone: [phone] Fax: [phone]  [id number] - [place], [place] - [address] [place] [address] Phone: [phone] Fax: [phone]   Patient reports affordability concerns with their medications: No  Patient reports access/transportation concerns to their pharmacy: No  Patient reports adherence concerns with their medications:  No       Diabetes:   Current medications: Ozempic 1 mg weekly  Medications tried in the past: metformin   Reports continues to tolerate Ozempic well   Current glucose readings: recalls recent fasting readings ranging ~90-130   Denies symptoms of hypoglycemia   Reports having regular well-balanced meals, while controlling carbohydrate portion sizes   Note since restarted drinking, cut back from ~12 cans of beer/day to currently drinking ~8 cans/day   Current physical activity: reports stays active throughout the day   Statin: rosuvastatin 5 mg daily   Current medication access support: Collaborated with [place] Simcox for assistance with re-enrollment for Ozempic patient assistance program through [employer]  - From review of chart, note [facility] CPhT contacted [employer] on 2/14 and was  advised patient approved for re-enrollment through 07/09/2023 - Today patient confirms picked up supply as sent to local pharmacy by CPP on 07/30/2021 and currently has ~2 week supply remaining - Reports received a letter from [employer] last week stating that his medication should be shipped to the office within the next 2 weeks.    Objective:  Lab Results  Component Value Date   HGBA1C 6.3 (H) 06/19/2022    Lab Results  Component Value Date   CREATININE 1.47 (H) 06/19/2022   BUN 20 06/19/2022   NA 134 (L) 06/19/2022   K 4.9 06/19/2022   CL 100 06/19/2022   CO2 25 06/19/2022    Lab Results  Component Value Date   CHOL 156 06/19/2022   HDL 50 06/19/2022   LDLCALC 86 06/19/2022   TRIG 102 06/19/2022   CHOLHDL 3.1 06/19/2022    Medications Reviewed Today     Reviewed by Rennis Petty, RPH-CPP (Pharmacist) on 07/30/22 at 1139  Med List Status: <None>   Medication Order Taking? Sig Documenting Provider Last Dose Status Informant  allopurinol (ZYLOPRIM) 300 MG tablet [id number]  TAKE ONE TABLET BY MOUTH DAILY [name], [name], MD  Active   azelastine (ASTELIN) 0.1 % nasal spray [id number]  Place 1 spray into both nostrils 2 (two) times daily. Use in each nostril as directed [name], DO  Active   benazepril (LOTENSIN) 40 MG tablet [id number] Yes TAKE ONE TABLET BY MOUTH EVERY MORNING AT 6:00 A.Hortencia Conradi, [name], DO Taking Active   Blood Glucose Calibration (OT ULTRA/FASTTK CNTRL SOLN) SOLN [id number]  Use as instructed to check blood sugars twice a day. [name], DO  Active   Blood Glucose Monitoring Suppl (ONE TOUCH ULTRA 2) w/Device KIT [id number]  Use as instructed to check blood sugars twice a day. [name], DO  Active   gabapentin (NEURONTIN) 100 MG capsule [id number]  TAKE ONE CAPSULE BY MOUTH THREE TIMES A DAY [name], Alexander Lenna Sciara, DO  Active   ketoconazole (NIZORAL) 2 % shampoo [id number]  Apply 1 application  topically 2 (two) times a week. As needed for scalp dermatitis [name], [name], DO  Active   Lancets Misc. ([place]) MISC [id number]  Use as instructed to check blood sugars twice a day. [name], DO  Active   Magnesium Gluconate 500 (27 MG) MG TABS [id number]  Take 1 tablet by mouth every evening.  [name], MD  Active            Med Note [facility], [name]   Sat Nov 06, 2014 11:01 AM) Received from: [employer]  melatonin 5 MG TABS [id number]  Take 1 tablet by mouth at bedtime.  [provider]  Active   MULTIPLE VITAMIN PO [id number]  Take by mouth. [name], MD  Active            Med Note [facility] Pall, [name]   Sat Nov 06, 2014 11:01 AM) Received from: [place] test strip [id number]  Use as instructed to check blood sugars twice a day. [name], DO  Active   OZEMPIC, 1 MG/DOSE, 4 MG/3ML Bonney Aid [id number] Yes Inject 1 mg into the skin once a week. [name], DO Taking Active   rosuvastatin (CRESTOR) 5 MG tablet [id number] Yes Take 1 tablet (5 mg total) by mouth at bedtime. [name], DO Taking Active   saw palmetto 160 MG capsule [id number]  Take 1 capsule (160 mg total) by mouth 2 (two) times daily. [name], [name], DO  Active   sertraline (ZOLOFT) 50 MG tablet [id number]  Take 1 tablet (50 mg total) by mouth daily. [name], DO  Active   sildenafil (REVATIO) 20 MG tablet [id number]  TAKE 3-5 TABLETS BY MOUTH DAILY AS NEEDED [name], [name], MD  Active   tamsulosin (FLOMAX) 0.4 MG CAPS capsule [id number]  Take 2 capsules (0.8 mg total) by mouth daily. [name], MD  Active   traZODone (DESYREL) 50 MG tablet [id number]  TAKE 1 AND 1/2 TABLET BY MOUTH AT BEDTIME [name], [name], DO  Active   vitamin B-12 (CYANOCOBALAMIN) 1000 MCG tablet [id number]  Take by mouth. [provider]  Active   zinc  gluconate 50 MG tablet [id number]  Take 50 mg by mouth daily. [provider]  Active               Assessment/Plan:   Diabetes: - Currently controlled - Again discuss impact of alcohol consumption on blood sugar control Reports since restarted drinking, cut back from ~12 cans of beer/day to currently drinking ~8 cans/day Encourage patient to continue cut back on alcohol consumption with a goal of totally stop drinking Declined need for further support at this time - Discuss importance of having regular well-balanced meals, while controlling carbohydrate portion sizes - Encourage patient to continue to monitor home blood sugar, keep log of results and have this record to review at medical appointments Patient to contact office or clinical pharmacist sooner if needed for readings outside of established parameters or new symptoms - Patient to follow up with  PCP today regarding return of back pain symptoms - Advise patient to follow up with [employer]  - Patient to contact office or clinical pharmacist         Follow Up Plan: Clinical Pharmacist to follow up with patient by telephone on 08/27/2022 at 11:30 am   Wallace Cullens, PharmD, [place], [facility] [place] [phone]

## 2022-08-28 NOTE — Patient Instructions (Addendum)
Goals Addressed             This Visit's Progress    Pharmacy Goals       Our goal A1c is less than 7%. This corresponds with fasting sugars less than 130 and 2 hour after meal sugars less than 180. Please continue keep a log of results when you check your blood sugar at home.  Feel free to call me with any questions or concerns. I look forward to our next call!  Wallace Cullens, PharmD, [place] [phone]

## 2022-09-02 IMAGING — US US ABDOMEN COMPLETE
1 series · 15 of 25 positions shown · non-contrast
Comparison: Right upper quadrant ultrasound November 01, 2014

CLINICAL DATA: Thrombocytopenia.  Alcohol use.

EXAM:
ABDOMEN ULTRASOUND COMPLETE

[Series 1: us abdomen complete · 124 acquisitions, 15 frames shown]
[im 1/124]
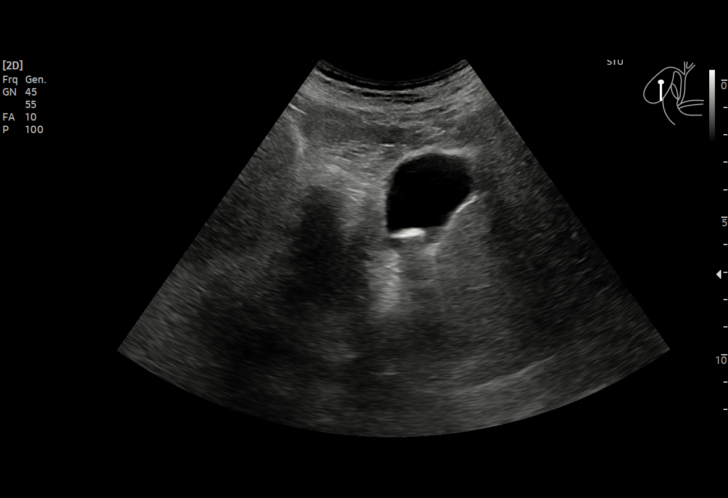
[im 11/124]
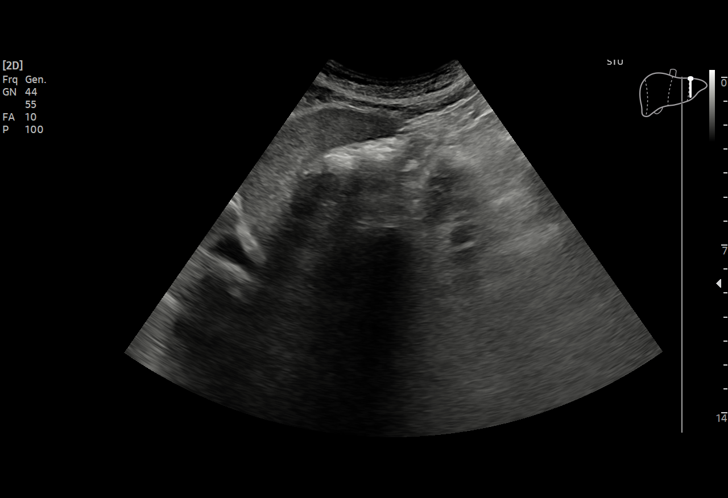
[im 21/124]
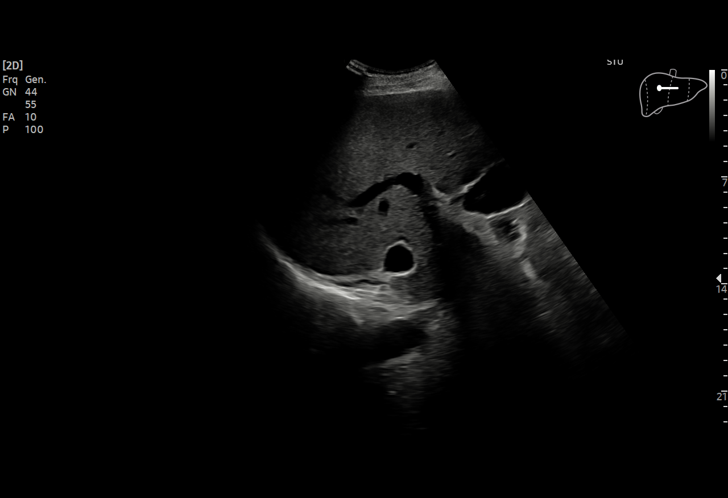
[im 26/124]
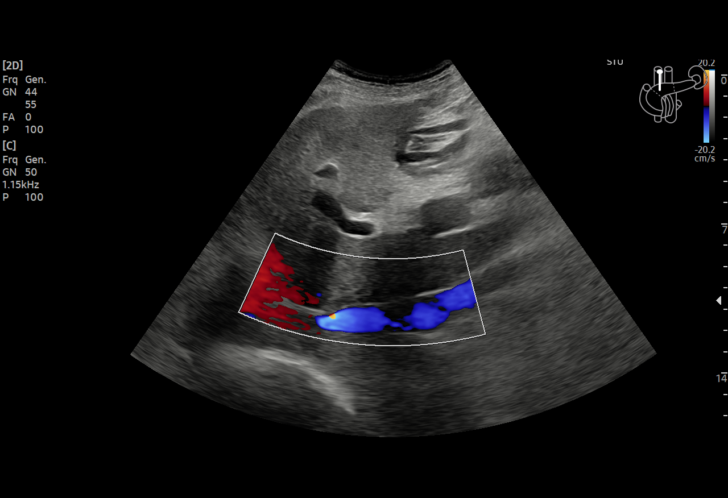
[im 36/124]
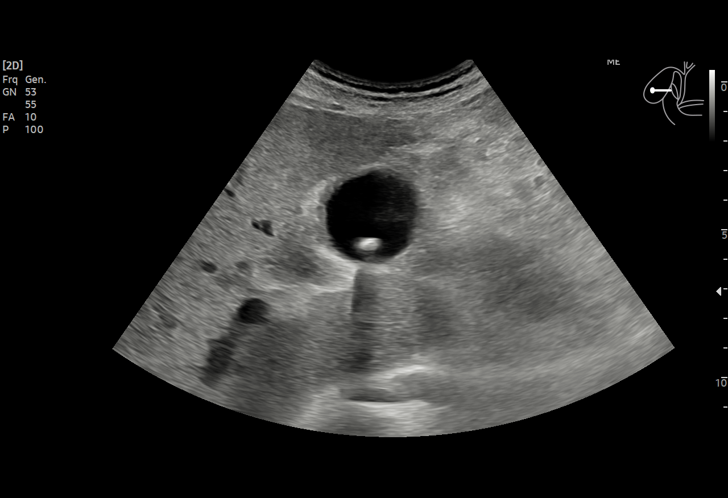
[im 47/124]
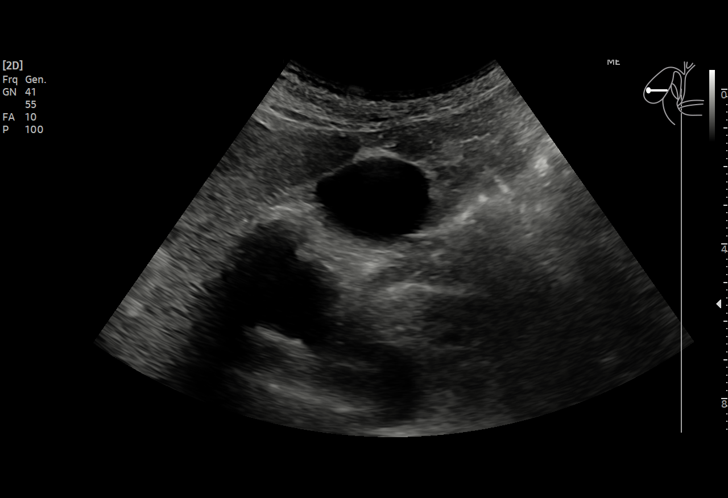
[im 52/124]
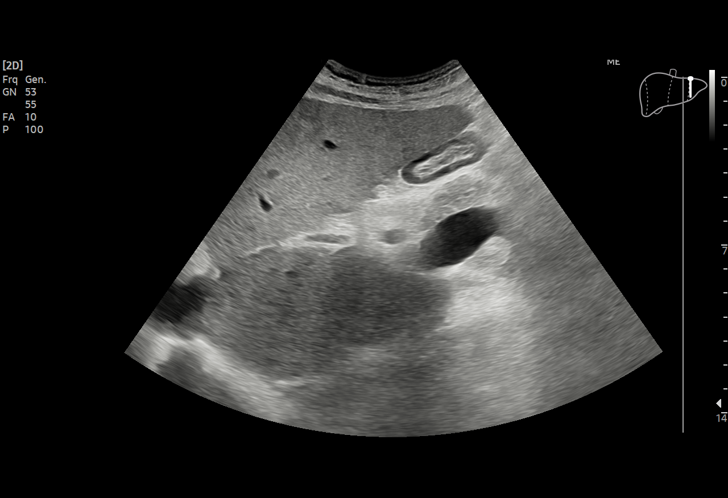
[im 62/124]
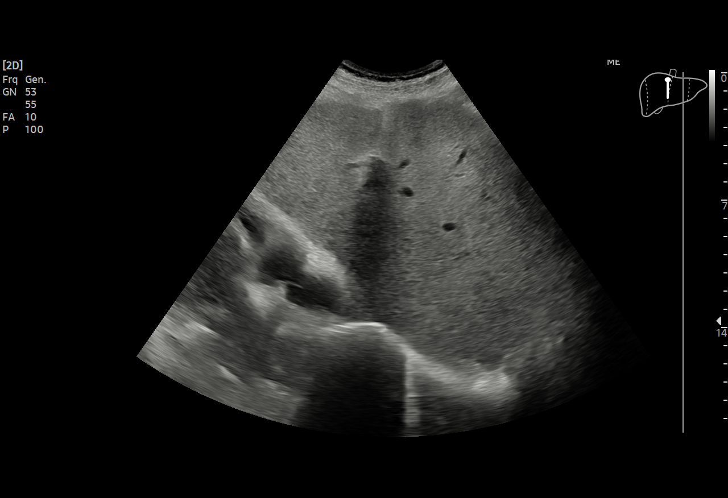
[im 72/124]
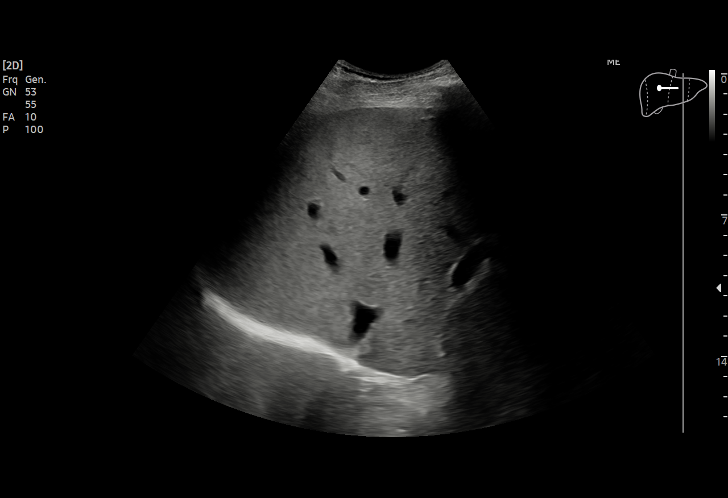
[im 77/124]
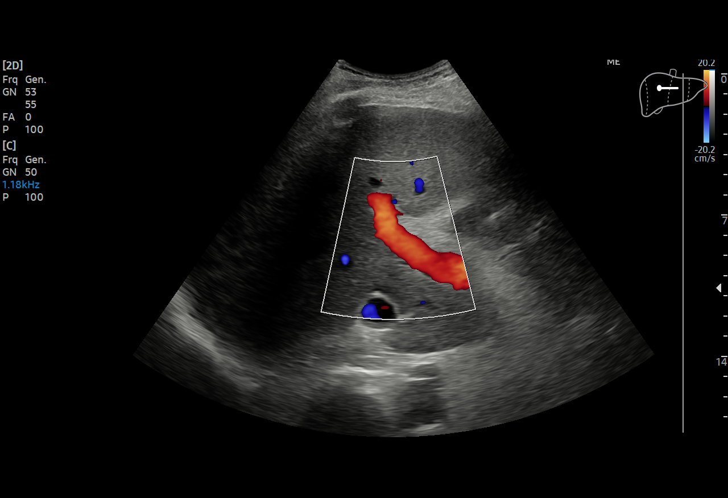
[im 88/124]
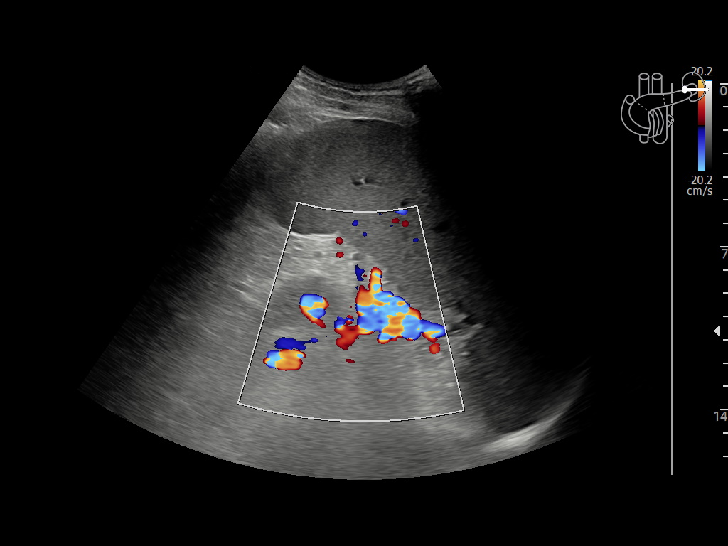
[im 98/124]
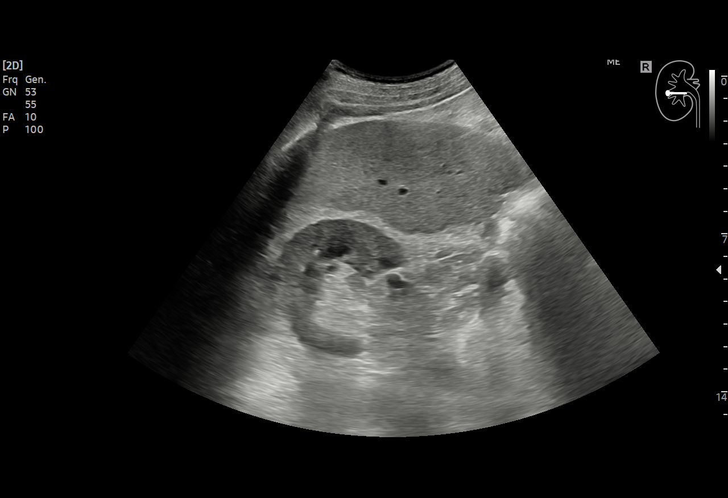
[im 103/124]
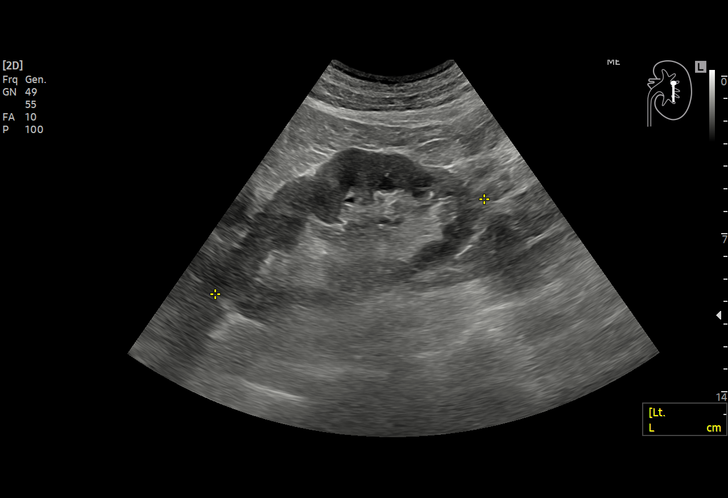
[im 113/124]
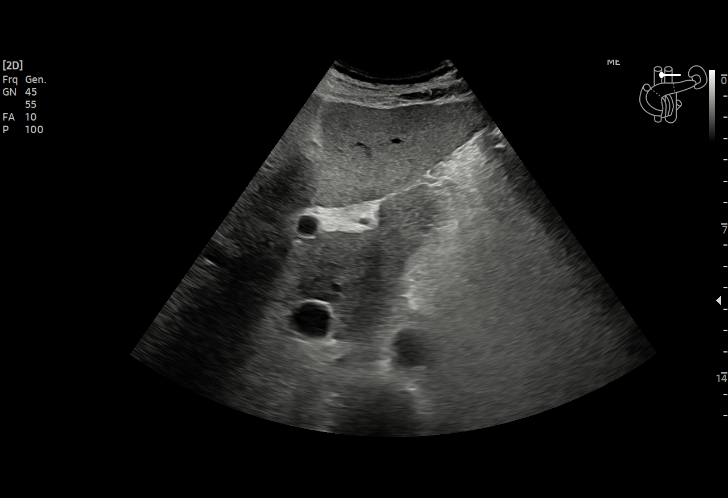
[im 124/124]
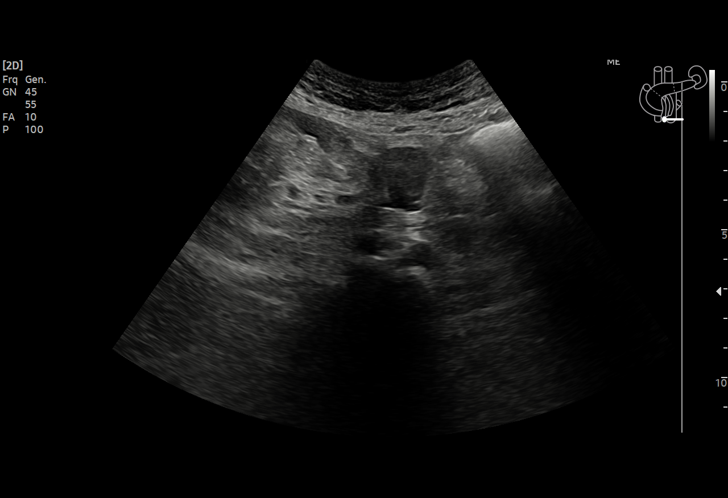

[15 of 25 positions shown; findings below may reference images not displayed]

FINDINGS: Gallbladder: Multiple stones in the gallbladder lumen. Negative
sonographic Murphy's sign. No gallbladder wall thickening.

Common bile duct: Diameter: 4 mm

Liver: Increased echogenicity. No focal lesion. Portal vein is
patent on color Doppler imaging with normal direction of blood flow
towards the liver.

IVC: No abnormality visualized.

Pancreas: Visualized portion unremarkable.

Spleen: Upper limits of normal measuring 12 cm.

Right Kidney: Length: 11.5 cm. Echogenicity within normal limits. No
mass or hydronephrosis visualized.

Left Kidney: Length: 12.6 cm. Echogenicity within normal limits. No
mass or hydronephrosis visualized.

Abdominal aorta: No aneurysm visualized.

Other findings: None.
IMPRESSION: Cholelithiasis without secondary signs to suggest acute
cholecystitis

Increased hepatic parenchymal echogenicity suggestive of steatosis.

Mild splenomegaly.

## 2022-09-10 ENCOUNTER — Encounter: Payer: Self-pay | Admitting: Pharmacist

## 2022-09-10 NOTE — Progress Notes (Signed)
   09/10/2022 Name: Richard Render Whitworth Sr. MRN: [id number] DOB: 1955-09-17  Chief Complaint  Patient presents with   Medication Assistance    Richard RAUDABAUGH Sr. is a 67 y.o. year old male who was referred to the pharmacist by their PCP for assistance in managing diabetes and medication access.   Receive a phone call from patient today regarding patient assistance for his [place] call to [employer] on behalf of patient. Speak with representative who advises patient's latest Ozempic refill was shipped on 2/22 and received by office on 2/23.  Follow up with patient today. Patient has stopped by practice and picked up this refill of Ozempic from office   Follow Up Plan: Clinical Pharmacist to follow up with patient by telephone on 05/10/2023 at 8:30 AM for medication assistance program re-enrollment   Wallace Cullens, PharmD, [place], [facility] [phone]

## 2022-09-12 ENCOUNTER — Other Ambulatory Visit: Payer: Self-pay | Admitting: Urology

## 2022-09-12 DIAGNOSIS — N401 Enlarged prostate with lower urinary tract symptoms: Secondary | ICD-10-CM

## 2022-09-12 DIAGNOSIS — N5201 Erectile dysfunction due to arterial insufficiency: Secondary | ICD-10-CM

## 2022-10-16 ENCOUNTER — Ambulatory Visit: Payer: Medicare HMO

## 2022-11-04 ENCOUNTER — Other Ambulatory Visit: Payer: Self-pay | Admitting: Family Medicine

## 2022-11-04 DIAGNOSIS — J3089 Other allergic rhinitis: Secondary | ICD-10-CM

## 2022-11-06 NOTE — Telephone Encounter (Signed)
Requested Prescriptions  Pending Prescriptions Disp Refills   Azelastine HCl 137 MCG/SPRAY SOLN [Pharmacy Med Name: AZELASTINE 0.1% (137 MCG) SPRY] 30 mL 3    Sig: SPRAY ONE SPRAY IN EACH NOSTRIL TWICE DAILY     Ear, Nose, and Throat: Nasal Preparations - Antiallergy Passed - 11/04/2022  3:31 PM      Passed - Valid encounter within last 12 months    Recent Outpatient Visits           2 months ago DM (diabetes mellitus), type 2 with neurological complications [facility])   [place] [facility] Delles, Gentry Fitz A, RPH-CPP   3 months ago DM (diabetes mellitus), type 2 with neurological complications [facility])   [place] [facility] Delles, Jackelyn Poling, RPH-CPP   3 months ago Annual physical exam   [facility] [place], Netta Neat, DO   4 months ago DM (diabetes mellitus), type 2 with neurological complications [facility])   [place] [facility] Delles, Gentry Fitz A, RPH-CPP   1 year ago DM (diabetes mellitus), type 2 with neurological complications [facility])   [place] [facility] [place], Netta Neat, DO       Future Appointments             In 2 months [place] Charon, Netta Neat, DO  [facility], [facility]   In 7 months [name], Verna Czech, MD [facility] [place]

## 2022-12-06 ENCOUNTER — Telehealth: Payer: Self-pay

## 2022-12-06 NOTE — Telephone Encounter (Signed)
Informed patient ozempic samples are ready for pick up. 

## 2022-12-23 ENCOUNTER — Other Ambulatory Visit: Payer: Self-pay | Admitting: Family Medicine

## 2022-12-23 ENCOUNTER — Other Ambulatory Visit: Payer: Self-pay | Admitting: Urology

## 2022-12-23 DIAGNOSIS — N5201 Erectile dysfunction due to arterial insufficiency: Secondary | ICD-10-CM

## 2022-12-23 DIAGNOSIS — N401 Enlarged prostate with lower urinary tract symptoms: Secondary | ICD-10-CM

## 2022-12-23 DIAGNOSIS — F339 Major depressive disorder, recurrent, unspecified: Secondary | ICD-10-CM

## 2022-12-23 DIAGNOSIS — I10 Essential (primary) hypertension: Secondary | ICD-10-CM

## 2022-12-24 NOTE — Telephone Encounter (Signed)
Requested Prescriptions  Pending Prescriptions Disp Refills   traZODone (DESYREL) 50 MG tablet [Pharmacy Med Name: TRAZODONE 50 MG TABLET] 135 tablet 0    Sig: TAKE 1 AND 1/2 TABLET BY MOUTH AT BEDTIME     Psychiatry: Antidepressants - Serotonin Modulator Passed - 12/23/2022  3:18 PM      Passed - Completed PHQ-2 or PHQ-9 in the last 360 days      Passed - Valid encounter within last 6 months    Recent Outpatient Visits           3 months ago DM (diabetes mellitus), type 2 with neurological complications [facility])   [facility] Delles, Gentry Fitz A, RPH-CPP   4 months ago DM (diabetes mellitus), type 2 with neurological complications [facility])   [facility] Delles, Gentry Fitz A, RPH-CPP   5 months ago Annual physical exam   [place] [place], Netta Neat, DO   6 months ago DM (diabetes mellitus), type 2 with neurological complications [facility])   [place] [facility] Delles, Gentry Fitz A, RPH-CPP   1 year ago DM (diabetes mellitus), type 2 with neurological complications [facility])   [place] [facility] [place], Netta Neat, DO       Future Appointments             In 1 month Althea Charon, Netta Neat, DO [place] [facility], [facility]   In 5 months [name], Verna Czech, MD [facility] [place]             benazepril (LOTENSIN) 40 MG tablet [Pharmacy Med Name: BENAZEPRIL HCL 40 MG TABLET] 90 tablet 0    Sig: TAKE 1 TABLET BY MOUTH EVERY MORNING AT 6     Cardiovascular:  ACE Inhibitors Failed - 12/23/2022  3:18 PM      Failed - Cr in normal range and within 180 days    Creat  Date Value Ref Range Status  06/19/2022 1.47 (H) 0.70 - 1.35 mg/dL Final   Creatinine, Urine  Date Value Ref Range Status  07/12/2022 59 20 - 320 mg/dL Final         Failed - K in normal range and within 180 days    Potassium  Date Value Ref Range Status   06/19/2022 4.9 3.5 - 5.3 mmol/L Final         Passed - Patient is not pregnant      Passed - Last BP in normal range    BP Readings from Last 1 Encounters:  07/12/22 128/66         Passed - Valid encounter within last 6 months    Recent Outpatient Visits           3 months ago DM (diabetes mellitus), type 2 with neurological complications [facility])   [facility] Delles, Gentry Fitz A, RPH-CPP   4 months ago DM (diabetes mellitus), type 2 with neurological complications [facility])   [facility] Delles, Jackelyn Poling, RPH-CPP   5 months ago Annual physical exam   [place] [facility] [place], Netta Neat, DO   6 months ago DM (diabetes mellitus), type 2 with neurological complications [facility])   [place] [facility] Delles, Gentry Fitz A, RPH-CPP   1 year ago DM (diabetes mellitus), type 2 with neurological complications [facility])   [place] [facility] [place], Lyn [place]ings[place]  J, DO       Future Appointments             In 1 month [name], Netta Neat, DO [place] [facility], [facility]   In 5 months [name], Verna Czech, MD [facility]

## 2023-01-11 ENCOUNTER — Other Ambulatory Visit: Payer: Medicare HMO

## 2023-01-14 LAB — HM DIABETES EYE EXAM

## 2023-01-18 ENCOUNTER — Ambulatory Visit: Payer: Medicare HMO | Admitting: Family Medicine

## 2023-01-24 ENCOUNTER — Other Ambulatory Visit: Payer: Medicare HMO

## 2023-01-24 DIAGNOSIS — E1149 Type 2 diabetes mellitus with other diabetic neurological complication: Secondary | ICD-10-CM | POA: Diagnosis not present

## 2023-01-25 LAB — COMPLETE METABOLIC PANEL WITH GFR
AG Ratio: 1.5 (calc) (ref 1.0–2.5)
ALT: 24 U/L (ref 9–46)
AST: 34 U/L (ref 10–35)
Albumin: 4.1 g/dL (ref 3.6–5.1)
Alkaline phosphatase (APISO): 132 U/L (ref 35–144)
BUN: 16 mg/dL (ref 7–25)
CO2: 27 mmol/L (ref 20–32)
Calcium: 9.3 mg/dL (ref 8.6–10.3)
Chloride: 105 mmol/L (ref 98–110)
Creat: 1.18 mg/dL (ref 0.70–1.35)
Globulin: 2.8 g/dL (calc) (ref 1.9–3.7)
Glucose, Bld: 123 mg/dL — ABNORMAL HIGH (ref 65–99)
Potassium: 4.8 mmol/L (ref 3.5–5.3)
Sodium: 139 mmol/L (ref 135–146)
Total Bilirubin: 0.4 mg/dL (ref 0.2–1.2)
Total Protein: 6.9 g/dL (ref 6.1–8.1)
eGFR: 67 mL/min/{1.73_m2} (ref >=60)

## 2023-01-25 LAB — HEMOGLOBIN A1C
Hgb A1c MFr Bld: 6.1 % of total Hgb — ABNORMAL HIGH (ref <5.7)
Mean Plasma Glucose: 128 mg/dL
eAG (mmol/L): 7.1 mmol/L

## 2023-01-31 ENCOUNTER — Encounter: Payer: Self-pay | Admitting: Family Medicine

## 2023-01-31 ENCOUNTER — Ambulatory Visit (INDEPENDENT_AMBULATORY_CARE_PROVIDER_SITE_OTHER): Payer: Medicare HMO | Admitting: Family Medicine

## 2023-01-31 ENCOUNTER — Other Ambulatory Visit: Payer: Self-pay | Admitting: Family Medicine

## 2023-01-31 VITALS — BP 132/78 | HR 69 | Ht 70.0 in | Wt 183.0 lb

## 2023-01-31 DIAGNOSIS — F339 Major depressive disorder, recurrent, unspecified: Secondary | ICD-10-CM | POA: Diagnosis not present

## 2023-01-31 DIAGNOSIS — D61818 Other pancytopenia: Secondary | ICD-10-CM

## 2023-01-31 DIAGNOSIS — I10 Essential (primary) hypertension: Secondary | ICD-10-CM

## 2023-01-31 DIAGNOSIS — E1169 Type 2 diabetes mellitus with other specified complication: Secondary | ICD-10-CM

## 2023-01-31 DIAGNOSIS — F1020 Alcohol dependence, uncomplicated: Secondary | ICD-10-CM

## 2023-01-31 DIAGNOSIS — E1149 Type 2 diabetes mellitus with other diabetic neurological complication: Secondary | ICD-10-CM | POA: Diagnosis not present

## 2023-01-31 DIAGNOSIS — Z1211 Encounter for screening for malignant neoplasm of colon: Secondary | ICD-10-CM | POA: Diagnosis not present

## 2023-01-31 DIAGNOSIS — N401 Enlarged prostate with lower urinary tract symptoms: Secondary | ICD-10-CM

## 2023-01-31 DIAGNOSIS — Z0000 Encounter for general adult medical examination without abnormal findings: Secondary | ICD-10-CM

## 2023-01-31 NOTE — Assessment & Plan Note (Signed)
Controlled DM A1c 6.1 Complications - peripheral neuropathy, other including hyperlipidemia, GERD, depression - increases risk of future cardiovascular complications poor glucose control due to reduced lifestyle diet/exercise with low energy mood and fatigue - Failed Victoza. OFF Glimepiride and NPH Insulin, Off Metformin  Plan:  1. CONTINUE dose for Ozepmic up to 1mg  weekly injection (PAP program) 2. Encourage improved lifestyle - low carb, low sugar diet, reduce portion size, continue improving regular exercise 3. Check CBG, bring log to next visit for review - For DM Neuropathy - keep on Gabapentin - adjust dose - 300mg  QHS and maybe 100mg  AM instead of 100 TID - sinc waking up with neuropathy cramping symptoms - we can refer to Neurology next if need 4. Continue ASA, ACEi

## 2023-01-31 NOTE — Progress Notes (Signed)
Subjective:    Patient ID: Richard Modena Sr., male    DOB: 02-Jun-1956, 67 y.o.   MRN: [id number]  Richard NAZARENO Sr. is a 67 y.o. male presenting on 01/31/2023 for Medical Management of Chronic Issues   HPI  CHRONIC DM, Type 2 with DM Neuropathy / BMI >26 Hypertension Improved A1c to 6.1 - Today reports he continues to do very well at this time CBGs: Checks CBGs 1-3x daily - avg 120-150, rarely low Meds: - Ozempic 1mg  weekly (PAP program) - Remains off Metformin - Failed Victoza in past, OFF Glimepiride, OFF NPH Insulin Reports good compliance. Tolerating well w/o side-effects Currently on ACEi Lifestyle: - Diet (improved diet overall still - reduced portions) - Exercise (increased activity and exercise) Chronic issue with known, Bilateral feet, mild loss of sensitivity Taking Vitamin B12 Magnesium, MVI, Melatonin. Admits tingling and neuropathy in feet bilateral. Takes OTC medicine for neuropathy. Still has cramping pain in both calves - worse in AM when wakes up otherwise not bad. On gabapentin 100mg  TID limited relief Has cataract, considering procedure Denies hypoglycemia, polyuria, visual changes or tingling.    Sinusitis, allergic Chronic year round, tried allergy pills and OTC nasal sprays but has rebound.   Major Depression, recurrent in remission / Insomnia Alcohol Dependence in remission Reports his mood has been good overall without new concerns. Continues on SSRI Sertraline 50mg  daily with great results Continues on Trazodone 1.5 pills for 75mg  nightly for insomnia.   HYPERLIPIDEMIA: - Reports no concerns. Last lipid panel 06/2022 improved On Rosuvastatin 5mg  nightly not causing side effects   CHRONIC HTN: Reports no concern Current Meds - Benazepril 40mg  daily   Reports good compliance, took meds today. Tolerating well, w/o complaints. Denies CP, dyspnea, HA, edema, dizziness / lightheadedness   Alcohol dependence, uncomplicated History of heavy drinking  with liquor He has resolved that and now reduced significantly Reduced alcohol again per day  LFT improved AST Sodium improved on lab.       Health Maintenance:   UTD Pneumonia Vaccine 02/2022  TDap 2015  Future Shingles vaccine at the pharmacy.   Last colonoscopy approx 2008. - Cologuard done 12/28/19 - negative, good for 3 years, next 12/2022 DUE today for repeat, ordered Cologuard      01/31/2023    9:04 AM 01/31/2023    8:41 AM 03/06/2022    8:09 AM  Depression screen PHQ 2/9  Decreased Interest 0 0 0  Down, Depressed, Hopeless 0 0 0  PHQ - 2 Score 0 0 0  Altered sleeping  0   Tired, decreased energy  0   Change in appetite  0   Feeling bad or failure about yourself   0   Trouble concentrating  0   Moving slowly or fidgety/restless  0   Suicidal thoughts  0   PHQ-9 Score  0   Difficult doing work/chores  Not difficult at all     Social History   Tobacco Use   Smoking status: Former    Current packs/day: 0.00    Types: Cigarettes    Start date: 08/07/1989    Quit date: 08/08/1999    Years since quitting: 23.4   Smokeless tobacco: Former  Building services engineer status: Never Used  Substance Use Topics   Alcohol use: Yes    Alcohol/week: 6.0 standard drinks of alcohol    Types: 6 Cans of beer per week   Drug use: No    Review of Systems Per  HPI unless specifically indicated above     Objective:    BP 132/78   Pulse 69   Ht 5\' 10"  (1.778 m)   Wt 183 lb (83 kg)   SpO2 99%   BMI 26.26 kg/m   Wt Readings from Last 3 Encounters:  01/31/23 183 lb (83 kg)  07/12/22 190 lb (86.2 kg)  06/14/22 185 lb (83.9 kg)    Physical Exam Vitals and nursing note reviewed.  Constitutional:      General: He is not in acute distress.    Appearance: Normal appearance. He is well-developed. He is not diaphoretic.     Comments: Well-appearing, comfortable, cooperative  HENT:     Head: Normocephalic and atraumatic.  Eyes:     General:        Right eye: No discharge.         Left eye: No discharge.     Conjunctiva/sclera: Conjunctivae normal.  Cardiovascular:     Rate and Rhythm: Normal rate.  Pulmonary:     Effort: Pulmonary effort is normal.  Skin:    General: Skin is warm and dry.     Findings: No erythema or rash.  Neurological:     Mental Status: He is alert and oriented to person, place, and time.  Psychiatric:        Mood and Affect: Mood normal.        Behavior: Behavior normal.        Thought Content: Thought content normal.     Comments: Well groomed, good eye contact, normal speech and thoughts     Results for orders placed or performed in visit on 01/24/23  Hemoglobin A1c  Result Value Ref Range   Hgb A1c MFr Bld 6.1 (H) <5.7 % of total Hgb   Mean Plasma Glucose 128 mg/dL   eAG (mmol/L) 7.1 mmol/L  COMPLETE METABOLIC PANEL WITH GFR  Result Value Ref Range   Glucose, Bld 123 (H) 65 - 99 mg/dL   BUN 16 7 - 25 mg/dL   Creat 1.61 0.96 - 0.45 mg/dL   eGFR 67 > OR = 60 [id number]/[id number]   BUN/Creatinine Ratio SEE NOTE: 6 - 22 (calc)   Sodium 139 135 - 146 mmol/L   Potassium 4.8 3.5 - 5.3 mmol/L   Chloride 105 98 - 110 mmol/L   CO2 27 20 - 32 mmol/L   Calcium 9.3 8.6 - 10.3 mg/dL   Total Protein 6.9 6.1 - 8.1 g/dL   Albumin 4.1 3.6 - 5.1 g/dL   Globulin 2.8 1.9 - 3.7 g/dL (calc)   AG Ratio 1.5 1.0 - 2.5 (calc)   Total Bilirubin 0.4 0.2 - 1.2 mg/dL   Alkaline phosphatase (APISO) 132 35 - 144 U/L   AST 34 10 - 35 U/L   ALT 24 9 - 46 U/L      Assessment & Plan:   Problem List Items Addressed This Visit     Alcohol dependence, uncomplicated (HCC)    Reduced alcohol intake LFT resolved and Sodium normal range.      DM (diabetes mellitus), type 2 with neurological complications (HCC) - Primary    Controlled DM A1c 6.1 Complications - peripheral neuropathy, other including hyperlipidemia, GERD, depression - increases risk of future cardiovascular complications poor glucose control due to reduced lifestyle diet/exercise with low  energy mood and fatigue - Failed Victoza. OFF Glimepiride and NPH Insulin, Off Metformin  Plan:  1. CONTINUE dose for Ozepmic up to 1mg  weekly injection (PAP program) 2.  Encourage improved lifestyle - low carb, low sugar diet, reduce portion size, continue improving regular exercise 3. Check CBG, bring log to next visit for review - For DM Neuropathy - keep on Gabapentin - adjust dose - 300mg  QHS and maybe 100mg  AM instead of 100 TID - sinc waking up with neuropathy cramping symptoms - we can refer to Neurology next if need 4. Continue ASA, ACEi      Essential hypertension    Controlled HTN No known complications     Plan:  1. Continue current BP regimen Benazepril 40mg  daily  2. Encourage improved lifestyle - low sodium diet, regular exercise 3. Continue monitor BP outside office, bring readings to next visit, if persistently >140/90 or new symptoms notify office sooner      Major depression, recurrent, chronic (HCC)    Controlled mood on SSRI and Trazodone On SSRI Sertraline 50mg  Continue Trazodone 75mg  nightly Follow-up as planned      Screening for colon cancer   Relevant Orders   Cologuard    Chemistry shows improved normalized Liver enzyme and Sodium. Good work with reducing alcohol.  Contact us for refills when you are ready  Future Shingrix vaccine x 2 at pharmacy when ready 2 -6 month apart  Orders Placed This Encounter  Procedures   Cologuard     No orders of the defined types were placed in this encounter.     Follow up plan: Return for 1 year fasting lab only then 1 week later Annual Physical, AM apt preferred, any day, no print AVS.  Future labs ordered for 07/11/23   Saralyn Pilar, DO [facility] 01/31/2023, 8:33 AM

## 2023-01-31 NOTE — Patient Instructions (Addendum)
Thank you for coming to the office today.  Recent Labs    06/19/22 0816 01/24/23 0802  HGBA1C 6.3* 6.1*   Improved A1c to 6.1  Chemistry shows improved normalized Liver enzyme and Sodium. Good work with reducing alcohol.  Contact us for refills when you are ready  Future Shingrix vaccine x 2 at pharmacy when ready 2 -6 month apart  DUE for FASTING BLOOD WORK (no food or drink after midnight before the lab appointment, only water or coffee without cream/sugar on the morning of)  SCHEDULE "Lab Only" visit in the morning at the clinic for lab draw in 1 YEAR  - Make sure Lab Only appointment is at about 1 week before your next appointment, so that results will be available  For Lab Results, once available within 2-3 days of blood draw, you can can log in to MyChart online to view your results and a brief explanation. Also, we can discuss results at next follow-up visit.   Please schedule a Follow-up Appointment to: Return for 1 year fasting lab only then 1 week later Annual Physical, AM apt preferred, any day, no print AVS.  If you have any other questions or concerns, please feel free to call the office or send a message through MyChart. You may also schedule an earlier appointment if necessary.  Additionally, you may be receiving a survey about your experience at our office within a few days to 1 week by e-mail or mail. We value your feedback.  Saralyn Pilar, DO [facility], [place]

## 2023-01-31 NOTE — Assessment & Plan Note (Signed)
Reduced alcohol intake LFT resolved and Sodium normal range.

## 2023-01-31 NOTE — Assessment & Plan Note (Signed)
Controlled HTN No known complications     Plan:  1. Continue current BP regimen Benazepril 40mg  daily  2. Encourage improved lifestyle - low sodium diet, regular exercise 3. Continue monitor BP outside office, bring readings to next visit, if persistently >140/90 or new symptoms notify office sooner

## 2023-01-31 NOTE — Assessment & Plan Note (Signed)
Controlled mood on SSRI and Trazodone On SSRI Sertraline 50mg  Continue Trazodone 75mg  nightly Follow-up as planned

## 2023-02-07 DIAGNOSIS — Z1211 Encounter for screening for malignant neoplasm of colon: Secondary | ICD-10-CM | POA: Diagnosis not present

## 2023-03-08 ENCOUNTER — Ambulatory Visit (INDEPENDENT_AMBULATORY_CARE_PROVIDER_SITE_OTHER): Payer: Medicare HMO

## 2023-03-08 DIAGNOSIS — Z0000 Encounter for general adult medical examination without abnormal findings: Secondary | ICD-10-CM | POA: Diagnosis not present

## 2023-03-08 NOTE — Patient Instructions (Addendum)
Richard Day , Thank you for taking time to come for your Medicare Wellness Visit. I appreciate your ongoing commitment to your health goals. Please review the following plan we discussed and let me know if I can assist you in the future.   Referrals/Orders/Follow-Ups/Clinician Recommendations: none  This is a list of the screening recommended for you and due dates:  Health Maintenance  Topic Date Due   COVID-19 Vaccine (5 - 2023-24 season) 03/09/2022   Zoster (Shingles) Vaccine (2 of 2) 09/06/2022   Flu Shot  02/07/2023   Yearly kidney health urinalysis for diabetes  07/13/2023   Complete foot exam   07/13/2023   Hemoglobin A1C  07/27/2023   Eye exam for diabetics  01/14/2024   Yearly kidney function blood test for diabetes  01/24/2024   Medicare Annual Wellness Visit  03/07/2024   DTaP/Tdap/Td vaccine (3 - Td or Tdap) 04/08/2024   Cologuard (Stool DNA test)  02/06/2026   Pneumonia Vaccine  Completed   Hepatitis C Screening  Completed   HPV Vaccine  Aged Out   Colon Cancer Screening  Discontinued    Advanced directives: (ACP Link)Information on Advanced Care Planning can be found at [facility] Advance Health Care Directives Advance Health Care Directives ([email or web address]   Next Medicare Annual Wellness Visit scheduled for next year: Yes   03/13/24 @ 10:00 am by phone

## 2023-03-08 NOTE — Progress Notes (Signed)
Subjective:   Richard TESSMANN Sr. is a 67 y.o. male who presents for Medicare Annual/Subsequent preventive examination.  Visit Complete: Virtual  I connected with  Richard Bellows Iribe Sr. on 03/08/23 by a audio enabled telemedicine application and verified that I am speaking with the correct person using two identifiers.  Patient Location: Home  Provider Location: Office/Clinic  I discussed the limitations of evaluation and management by telemedicine. The patient expressed understanding and agreed to proceed.  Vital Signs: Unable to obtain new vitals due to this being a telehealth visit.  Review of Systems     Cardiac Risk Factors include: advanced age (>81men, >60 women);diabetes mellitus;dyslipidemia;hypertension;male gender     Objective:    There were no vitals filed for this visit. There is no height or weight on file to calculate BMI.     03/08/2023   10:03 AM 11/06/2021    9:59 AM 10/06/2021    3:23 PM 02/28/2021    8:21 AM  Advanced Directives  Does Patient Have a Medical Advance Directive? No No No No  Would patient like information on creating a medical advance directive? No - Patient declined  Yes (MAU/Ambulatory/Procedural Areas - Information given)     Current Medications (verified) Outpatient Encounter Medications as of 03/08/2023  Medication Sig   allopurinol (ZYLOPRIM) 300 MG tablet TAKE 1 TABLET BY MOUTH DAILY   Azelastine HCl 137 MCG/SPRAY SOLN SPRAY ONE SPRAY IN EACH NOSTRIL TWICE DAILY   benazepril (LOTENSIN) 40 MG tablet TAKE 1 TABLET BY MOUTH EVERY MORNING AT 6   Blood Glucose Calibration (OT ULTRA/FASTTK CNTRL SOLN) SOLN Use as instructed to check blood sugars twice a day.   Blood Glucose Monitoring Suppl (ONE TOUCH ULTRA 2) w/Device KIT Use as instructed to check blood sugars twice a day.   gabapentin (NEURONTIN) 100 MG capsule TAKE ONE CAPSULE BY MOUTH THREE TIMES A DAY   ketoconazole (NIZORAL) 2 % shampoo Apply 1 application topically 2 (two) times a  week. As needed for scalp dermatitis   Lancets Misc. (ONE TOUCH SURESOFT) MISC Use as instructed to check blood sugars twice a day.   Magnesium Gluconate 500 (27 MG) MG TABS Take 1 tablet by mouth every evening.    melatonin 5 MG TABS Take 1 tablet by mouth at bedtime.    MULTIPLE VITAMIN PO Take by mouth.   ONETOUCH ULTRA test strip Use as instructed to check blood sugars twice a day.   OZEMPIC, 1 MG/DOSE, 4 MG/3ML SOPN Inject 1 mg into the skin once a week.   rosuvastatin (CRESTOR) 5 MG tablet Take 1 tablet (5 mg total) by mouth at bedtime.   saw palmetto 160 MG capsule Take 1 capsule (160 mg total) by mouth 2 (two) times daily.   sertraline (ZOLOFT) 50 MG tablet Take 1 tablet (50 mg total) by mouth daily.   sildenafil (REVATIO) 20 MG tablet TAKE 3 TO 5 TABLETS BY MOUTH DAILY AS NEEDED   tamsulosin (FLOMAX) 0.4 MG CAPS capsule Take 2 capsules (0.8 mg total) by mouth daily.   traZODone (DESYREL) 50 MG tablet TAKE 1 AND 1/2 TABLET BY MOUTH AT BEDTIME   vitamin B-12 (CYANOCOBALAMIN) 1000 MCG tablet Take by mouth.   zinc gluconate 50 MG tablet Take 50 mg by mouth daily. (Patient not taking: Reported on 03/08/2023)   No facility-administered encounter medications on file as of 03/08/2023.    Allergies (verified) Bee venom   History: Past Medical History:  Diagnosis Date   Anxiety  Cholelithiasis    DM (diabetes mellitus), type 2 with neurological complications (HCC)    GERD (gastroesophageal reflux disease)    Hyperlipidemia    Pancytopenia (HCC)    Seasonal allergic rhinitis due to other allergic trigger    Past Surgical History:  Procedure Laterality Date   Lung Collapse  67 years old.    Family History  Problem Relation Age of Onset   Diabetes Father        complications of DM caused death   Heart attack Father    Diabetes Sister    Cancer Mother        lung   Lung cancer Mother    Social History   Socioeconomic History   Marital status: Married    Spouse name:  Richard Day   Number of children: 2   Years of education: Not on file   Highest education level: Some college, no degree  Occupational History   Occupation: retired  Tobacco Use   Smoking status: Former    Current packs/day: 0.00    Types: Cigarettes    Start date: 08/07/1989    Quit date: 08/08/1999    Years since quitting: 23.5   Smokeless tobacco: Former  Building services engineer status: Never Used  Substance and Sexual Activity   Alcohol use: Yes    Alcohol/week: 6.0 standard drinks of alcohol    Types: 6 Cans of beer per week   Drug use: No   Sexual activity: Yes  Other Topics Concern   Not on file  Social History Narrative   Not on file   Social Determinants of Health   Financial Resource Strain: Low Risk  (03/08/2023)   Overall Financial Resource Strain (CARDIA)    Difficulty of Paying Living Expenses: Not hard at all  Food Insecurity: No Food Insecurity (03/08/2023)   Hunger Vital Sign    Worried About Running Out of Food in the Last Year: Never true    Ran Out of Food in the Last Year: Never true  Transportation Needs: No Transportation Needs (03/08/2023)   PRAPARE - Administrator, Civil Service (Medical): No    Lack of Transportation (Non-Medical): No  Physical Activity: Sufficiently Active (03/08/2023)   Exercise Vital Sign    Days of Exercise per Week: 3 days    Minutes of Exercise per Session: 60 min  Stress: No Stress Concern Present (03/08/2023)   [employer] of Occupational Health - Occupational Stress Questionnaire    Feeling of Stress : Not at all  Social Connections: Moderately Isolated (03/08/2023)   Social Connection and Isolation Panel [NHANES]    Frequency of Communication with Friends and Family: Three times a week    Frequency of Social Gatherings with Friends and Family: Once a week    Attends Religious Services: Never    Database administrator or Organizations: No    Attends Engineer, structural: Never    Marital Status:  Married    Tobacco Counseling Counseling given: Not Answered   Clinical Intake:  Pre-visit preparation completed: Yes  Pain : No/denies pain     Nutritional Risks: None Diabetes: Yes CBG done?: No Did pt. bring in CBG monitor from home?: No  How often do you need to have someone help you when you read instructions, pamphlets, or other written materials from your doctor or pharmacy?: 1 - Never  Interpreter Needed?: No  Information entered by :: Kennedy Bucker, LPN   Activities of Daily Living  03/08/2023   10:05 AM 03/08/2023    7:52 AM  In your present state of health, do you have any difficulty performing the following activities:  Hearing? 0 0  Vision? 0 0  Difficulty concentrating or making decisions? 0 0  Walking or climbing stairs? 0 0  Dressing or bathing? 0 0  Doing errands, shopping? 0 0  Preparing Food and eating ? N N  Using the Toilet? N N  In the past six months, have you accidently leaked urine? N N  Do you have problems with loss of bowel control? N N  Managing your Medications? N N  Managing your Finances? N N  Housekeeping or managing your Housekeeping? N N    Patient Care Team: Smitty Cords, DO as PCP - General (Family Medicine) Ronney Asters, Jackelyn Poling, RPH-CPP as Pharmacist (Pharmacist) Rickard Patience, MD as Consulting Physician (Hematology and Oncology) [facility]  Indicate any recent Medical Services you may have received from other than [facility] providers in the past year (date may be approximate).     Assessment:   This is a routine wellness examination for Dailey.  Hearing/Vision screen Hearing Screening - Comments:: No aids- tinnitus in left ear Vision Screening - Comments:: Wears glasses- Dr. Alvester Morin  Dietary issues and exercise activities discussed:     Goals Addressed             This Visit's Progress    DIET - EAT MORE FRUITS AND VEGETABLES         Depression Screen    03/08/2023   10:01 AM  01/31/2023    9:04 AM 01/31/2023    8:41 AM 03/06/2022    8:09 AM 09/20/2021    8:08 AM 06/14/2021    9:26 AM 02/28/2021    8:22 AM  PHQ 2/9 Scores  PHQ - 2 Score 0 0 0 0 0 0 0  PHQ- 9 Score 0  0  1 0     Fall Risk    03/08/2023   10:04 AM 03/08/2023    7:52 AM 01/31/2023    9:04 AM 03/06/2022    8:04 AM 09/20/2021    8:08 AM  Fall Risk   Falls in the past year? 1 1 1 1  0  Number falls in past yr: 1 1 1  0 0  Injury with Fall? 0 0 0 1 0  Risk for fall due to : History of fall(s)   Impaired mobility No Fall Risks  Follow up Falls prevention discussed;Falls evaluation completed   Falls evaluation completed Falls evaluation completed    MEDICARE RISK AT HOME: Medicare Risk at Home Any stairs in or around the home?: Yes If so, are there any without handrails?: No Home free of loose throw rugs in walkways, pet beds, electrical cords, etc?: Yes Adequate lighting in your home to reduce risk of falls?: Yes Life alert?: No Use of a cane, walker or w/c?: No Grab bars in the bathroom?: Yes Shower chair or bench in shower?: No Elevated toilet seat or a handicapped toilet?: No  TIMED UP AND GO:  Was the test performed?  No    Cognitive Function:        03/08/2023   10:06 AM 03/06/2022    8:37 AM 02/28/2021    8:23 AM  6CIT Screen  What Year? 0 points 0 points 0 points  What month? 0 points 0 points 0 points  What time? 0 points 0 points 0 points  Count back from  20 4 points 0 points 0 points  Months in reverse 0 points 2 points 4 points  Repeat phrase 4 points 0 points 0 points  Total Score 8 points 2 points 4 points    Immunizations Immunization History  Administered Date(s) Administered   Fluad Quad(high Dose 65+) 06/15/2020, 07/12/2022   Influenza,inj,Quad PF,6+ Mos 03/17/2014, 04/12/2016, 05/06/2017, 06/02/2018, 06/10/2019   Influenza-Unspecified 04/08/2014, 05/11/2015, 06/08/2021   PFIZER(Purple Top)SARS-COV-2 Vaccination 10/27/2019, 11/17/2019, 06/07/2020   Pfizer  Covid-19 Vaccine Bivalent Booster 78yrs & up 06/08/2021   Pneumococcal Conjugate-13 12/15/2019   Pneumococcal Polysaccharide-23 03/09/2014, 04/22/2014, 03/06/2022   Tdap 07/09/2008, 04/08/2014   Zoster Recombinant(Shingrix) 07/12/2022    TDAP status: Up to date  Flu Vaccine status: Up to date  Pneumococcal vaccine status: Up to date  Covid-19 vaccine status: Completed vaccines  Qualifies for Shingles Vaccine? Yes   Zostavax completed No   Shingrix Completed?: No.    Education has been provided regarding the importance of this vaccine. Patient has been advised to call insurance company to determine out of pocket expense if they have not yet received this vaccine. Advised may also receive vaccine at local pharmacy or Health Dept. Verbalized acceptance and understanding.  Screening Tests Health Maintenance  Topic Date Due   COVID-19 Vaccine (5 - 2023-24 season) 03/09/2022   Zoster Vaccines- Shingrix (2 of 2) 09/06/2022   INFLUENZA VACCINE  02/07/2023   Diabetic kidney evaluation - Urine ACR  07/13/2023   FOOT EXAM  07/13/2023   HEMOGLOBIN A1C  07/27/2023   OPHTHALMOLOGY EXAM  01/14/2024   Diabetic kidney evaluation - eGFR measurement  01/24/2024   Medicare Annual Wellness (AWV)  03/07/2024   DTaP/Tdap/Td (3 - Td or Tdap) 04/08/2024   Fecal DNA (Cologuard)  02/06/2026   Pneumonia Vaccine 35+ Years old  Completed   Hepatitis C Screening  Completed   HPV VACCINES  Aged Out   Colonoscopy  Discontinued    Health Maintenance  Health Maintenance Due  Topic Date Due   COVID-19 Vaccine (5 - 2023-24 season) 03/09/2022   Zoster Vaccines- Shingrix (2 of 2) 09/06/2022   INFLUENZA VACCINE  02/07/2023  Has had 1st Shingrix  Colorectal cancer screening: Type of screening: Cologuard. Completed 02/07/23. Repeat every 3 years  Lung Cancer Screening: (Low Dose CT Chest recommended if Age 63-80 years, 20 pack-year currently smoking OR have quit w/in 15years.) does not qualify.     Additional Screening:  Hepatitis C Screening: does qualify; Completed 10/09/21  Vision Screening: Recommended annual ophthalmology exams for early detection of glaucoma and other disorders of the eye. Is the patient up to date with their annual eye exam?  Yes  Who is the provider or what is the name of the office in which the patient attends annual eye exams? Dr.Bell If pt is not established with a provider, would they like to be referred to a provider to establish care? No .   Dental Screening: Recommended annual dental exams for proper oral hygiene    Community Resource Referral / Chronic Care Management: CRR required this visit?  No   CCM required this visit?  No     Plan:     I have personally reviewed and noted the following in the patient's chart:   Medical and social history Use of alcohol, tobacco or illicit drugs  Current medications and supplements including opioid prescriptions. Patient is not currently taking opioid prescriptions. Functional ability and status Nutritional status Physical activity Advanced directives List of other physicians Hospitalizations, surgeries, and  ER visits in previous 12 months Vitals Screenings to include cognitive, depression, and falls Referrals and appointments  In addition, I have reviewed and discussed with patient certain preventive protocols, quality metrics, and best practice recommendations. A written personalized care plan for preventive services as well as general preventive health recommendations were provided to patient.     Hal Hope, LPN   [id number]/[id number]   After Visit Summary: (MyChart) Due to this being a telephonic visit, the after visit summary with patients personalized plan was offered to patient via MyChart   Nurse Notes: none

## 2023-03-19 ENCOUNTER — Encounter: Payer: Self-pay | Admitting: Family Medicine

## 2023-05-10 ENCOUNTER — Ambulatory Visit: Payer: Medicare HMO | Admitting: Pharmacist

## 2023-05-10 DIAGNOSIS — E1149 Type 2 diabetes mellitus with other diabetic neurological complication: Secondary | ICD-10-CM

## 2023-05-10 NOTE — Progress Notes (Signed)
05/10/2023 Name: Richard Bellows Rottmann Sr. MRN: [id number] DOB: 1955/12/28  Chief Complaint  Patient presents with   Medication Assistance    Richard LISENBEE Sr. is a 67 y.o. year old male who presented for a telephone visit.   They were referred to the pharmacist by their PCP for assistance in managing medication access.    Subjective:  Care Team: Primary Care Provider: Smitty Cords, DO ; Next Scheduled Visit: 07/11/2023 Urology: Riki Altes, MD; Next Scheduled Visit: 06/14/2023  Medication Access/Adherence  Current Pharmacy:  [facility] - [place], [place] - [address] [place] [address] Phone: [phone] Fax: [phone]  [id number] - [place], [place] - [address] [place] [address] Phone: [phone] Fax: [phone]   Patient reports affordability concerns with their medications: No  Patient reports access/transportation concerns to their pharmacy: No  Patient reports adherence concerns with their medications:  No       Diabetes:   Current medications: Ozempic 1 mg weekly on Thursdays Medications tried in the past: metformin   Reports continues to tolerate Ozempic well   Current glucose readings: recalls recent fasting readings ranging 110-130   Denies symptoms of hypoglycemia   Reports having regular well-balanced meals, while controlling carbohydrate portion sizes   Current physical activity: reports staying active throughout the day   Statin: rosuvastatin 5 mg daily   Current medication access support: Enrolled in patient assistance for Ozempic patient assistance program through [employer] through 07/09/2023 - Reports currently has enough Ozempic to last into next calendar year  Objective:  Lab Results  Component Value Date   HGBA1C 6.1 (H) 01/24/2023    Lab Results  Component Value Date   CREATININE 1.18 01/24/2023   BUN 16  01/24/2023   NA 139 01/24/2023   K 4.8 01/24/2023   CL 105 01/24/2023   CO2 27 01/24/2023    Lab Results  Component Value Date   CHOL 156 06/19/2022   HDL 50 06/19/2022   LDLCALC 86 06/19/2022   TRIG 102 06/19/2022   CHOLHDL 3.1 06/19/2022    Medications Reviewed Today     Reviewed by Manuela Neptune, RPH-CPP (Pharmacist) on 05/10/23 at 0839  Med List Status: <None>   Medication Order Taking? Sig Documenting Provider Last Dose Status Informant  allopurinol (ZYLOPRIM) 300 MG tablet [id number]  TAKE 1 TABLET BY MOUTH DAILY [name], Verna Czech, MD  Active   Azelastine HCl 137 MCG/SPRAY SOLN [id number]  SPRAY ONE SPRAY IN EACH NOSTRIL TWICE DAILY Smitty Cords, DO  Active   benazepril (LOTENSIN) 40 MG tablet [id number]  TAKE 1 TABLET BY MOUTH EVERY MORNING AT 6 [name], Alexander J, DO  Active   Blood Glucose Calibration (OT ULTRA/FASTTK CNTRL SOLN) SOLN [id number]  Use as instructed to check blood sugars twice a day. [name], Netta Neat, DO  Active   Blood Glucose Monitoring Suppl (ONE TOUCH ULTRA 2) w/Device KIT [id number]  Use as instructed to check blood sugars twice a day. Smitty Cords, DO  Active   gabapentin (NEURONTIN) 100 MG capsule [id number]  TAKE ONE CAPSULE BY MOUTH THREE TIMES A DAY [name], Alexander Shela Commons, DO  Active   ketoconazole (NIZORAL) 2 % shampoo [id number]  Apply 1 application topically 2 (two) times a week. As needed for scalp dermatitis [name], Netta Neat, DO  Active   Lancets Misc. (ONE Darcus Austin) MISC [id number]  Use as instructed to check  blood sugars twice a day. Smitty Cords, DO  Active   Magnesium Gluconate 500 (27 MG) MG TABS [id number]  Take 1 tablet by mouth every evening.  Janeann Forehand., MD  Active            Med Note Kelli Churn, [facility] M   Thu Jan 31, 2023  8:25 AM)    melatonin 5 MG TABS [id number]  Take 1 tablet by mouth at bedtime.  [provider]  Active   MULTIPLE VITAMIN PO [id number]   Take by mouth. Janeann Forehand., MD  Active            Med Note Kelli Churn, [facility] M   Thu Jan 31, 2023  8:25 AM)    Koren Bound test strip [id number]  Use as instructed to check blood sugars twice a day. Smitty Cords, DO  Active   OZEMPIC, 1 MG/DOSE, 4 MG/3ML SOPN [id number] Yes Inject 1 mg into the skin once a week. Smitty Cords, DO Taking Active   rosuvastatin (CRESTOR) 5 MG tablet [id number] Yes Take 1 tablet (5 mg total) by mouth at bedtime. Smitty Cords, DO Taking Active   saw palmetto 160 MG capsule [id number]  Take 1 capsule (160 mg total) by mouth 2 (two) times daily. [name], Netta Neat, DO  Active   sertraline (ZOLOFT) 50 MG tablet [id number]  Take 1 tablet (50 mg total) by mouth daily. Smitty Cords, DO  Active   sildenafil (REVATIO) 20 MG tablet [id number]  TAKE 3 TO 5 TABLETS BY MOUTH DAILY AS NEEDED Vaillancourt, Samantha, PA-C  Active   tamsulosin (FLOMAX) 0.4 MG CAPS capsule [id number]  Take 2 capsules (0.8 mg total) by mouth daily. Riki Altes, MD  Active   traZODone (DESYREL) 50 MG tablet [id number]  TAKE 1 AND 1/2 TABLET BY MOUTH AT BEDTIME [name], Netta Neat, DO  Active   vitamin B-12 (CYANOCOBALAMIN) 1000 MCG tablet [id number]  Take by mouth. [provider]  Active   zinc gluconate 50 MG tablet [id number]  Take 50 mg by mouth daily.  Patient not taking: Reported on 03/08/2023   [provider]  Active               Assessment/Plan:   Diabetes: - Currently controlled - Discuss importance of having regular well-balanced meals, while controlling carbohydrate portion sizes - Encourage patient to continue to monitor home blood sugar, keep log of results and have this record to review at medical appointments Patient to contact office or clinical pharmacist sooner if needed for readings outside of established parameters or new symptoms - Patient to follow up with PCP regarding return of back pain  symptoms - Patient to follow up with [employer] regarding as needed for refills of his Ozempic  - Will collaborate with PCP and CPhT to support patient with applying for re-enrollment in [employer] patient assistance for [employer] for 2025       Follow Up Plan: Clinical Pharmacist to follow up with patient by telephone on 08/05/2023 at 8:30 AM    Estelle Grumbles, PharmD, [place]y [place], CPP Clinical Pharmacist [facility] [phone]

## 2023-05-10 NOTE — Patient Instructions (Signed)
Goals Addressed             This Visit's Progress    Pharmacy Goals       Please watch the mail for an envelope from [facility] containing the patient assistance program application. Please complete this application and mail back to [facility] Pharmacy Technician Noreene Larsson [place] along with a copy of your Medicare Part D prescription card and a copy of your proof of income document OR you can bring these documents to the office to have them faxed back to Attention: Pattricia Boss at Fax # [phone]   If you need to call Noreene Larsson, you can reach her at [phone]  If you need to reach out to patient assistance programs regarding refills or to find out the status of your application, you can do so by calling:  [employer] at [phone]  Our goal A1c is less than 7%. This corresponds with fasting sugars less than 130 and 2 hour after meal sugars less than 180. Please continue keep a log of results when you check your blood sugar at home.  Feel free to call me with any questions or concerns. I look forward to our next call!  Estelle Grumbles, PharmD, [facility] Clinical Pharmacist [facility] [phone]

## 2023-05-16 ENCOUNTER — Telehealth: Payer: Self-pay | Admitting: Pharmacy Technician

## 2023-05-16 DIAGNOSIS — Z5986 Financial insecurity: Secondary | ICD-10-CM

## 2023-05-16 NOTE — Progress Notes (Signed)
[facility] Customer service manager [facility])                                            [facility] Quality Pharmacy Team    05/16/2023  Richard Vences Abbett Sr. 1955-12-29 [id number]                                      Medication Assistance Referral  Referral From:  [facility] [place] PharmD Estelle Grumbles  Medication/Company: Franki Monte / [employer] Patient application portion:  Mailed Provider application portion: Faxed  to Dr. Saralyn Pilar Provider address/fax verified via: Office website  Pattricia Boss, CPhT [place]  Office: [phone] Fax: [phone] Email: [email or web address]

## 2023-05-26 ENCOUNTER — Other Ambulatory Visit: Payer: Self-pay | Admitting: Family Medicine

## 2023-05-26 DIAGNOSIS — F339 Major depressive disorder, recurrent, unspecified: Secondary | ICD-10-CM

## 2023-05-26 DIAGNOSIS — I10 Essential (primary) hypertension: Secondary | ICD-10-CM

## 2023-05-27 NOTE — Telephone Encounter (Signed)
Requested Prescriptions  Pending Prescriptions Disp Refills   sertraline (ZOLOFT) 50 MG tablet [Pharmacy Med Name: SERTRALINE HCL 50 MG TABLET] 90 tablet 1    Sig: TAKE 1 TABLET BY MOUTH DAILY     Psychiatry:  Antidepressants - SSRI - sertraline Passed - 05/26/2023  2:51 PM      Passed - AST in normal range and within 360 days    AST  Date Value Ref Range Status  01/24/2023 34 10 - 35 U/L Final         Passed - ALT in normal range and within 360 days    ALT  Date Value Ref Range Status  01/24/2023 24 9 - 46 U/L Final         Passed - Completed PHQ-2 or PHQ-9 in the last 360 days      Passed - Valid encounter within last 6 months    Recent Outpatient Visits           2 weeks ago DM (diabetes mellitus), type 2 with neurological complications [facility])   [place] [facility] Delles, Gentry Fitz A, RPH-CPP   3 months ago DM (diabetes mellitus), type 2 with neurological complications [facility])   [place] [facility] [place], Netta Neat, DO   9 months ago DM (diabetes mellitus), type 2 with neurological complications [facility])   [place] [facility] Delles, Gentry Fitz A, RPH-CPP   10 months ago DM (diabetes mellitus), type 2 with neurological complications [facility])   [place] [facility] Delles, Jackelyn Poling, RPH-CPP   10 months ago Annual physical exam   [place] Smitty Cords, DO       Future Appointments             In 2 weeks Lonna Cobb, Verna Czech, MD [facility] [place]   In 1 month Althea Charon, Netta Neat, DO [place] [facility]             traZODone (DESYREL) 50 MG tablet [Pharmacy Med Name: traZODone 50 MG TABLET] 135 tablet 0    Sig: TAKE 1 AND 1/2 TABLET BY MOUTH AT BEDTIME     Psychiatry: Antidepressants - Serotonin Modulator Passed - 05/26/2023  2:51 PM      Passed - Completed PHQ-2 or PHQ-9 in the last 360 days       Passed - Valid encounter within last 6 months    Recent Outpatient Visits           2 weeks ago DM (diabetes mellitus), type 2 with neurological complications [facility])   [place] [facility] Delles, Gentry Fitz A, RPH-CPP   3 months ago DM (diabetes mellitus), type 2 with neurological complications [facility])   [place] [facility] [place], Netta Neat, DO   9 months ago DM (diabetes mellitus), type 2 with neurological complications [facility]   [place] [facility] Delles, Gentry Fitz A, RPH-CPP   10 months ago DM (diabetes mellitus), type 2 with neurological complications [facility])   [place] [facility] Delles, Jackelyn Poling, RPH-CPP   10 months ago Annual physical exam   [place] [facility] [place], Netta Neat, DO       Future Appointments             In 2 weeks [name], Verna Czech, MD [facility] [place]   In 1 month [place], Netta Neat,  DO [place] [facility]             benazepril (LOTENSIN) 40 MG tablet [Pharmacy Med Name: BENAZEPRIL HCL 40 MG TABLET] 90 tablet 0    Sig: TAKE 1 TABLET BY MOUTH EVERY MORNING AT 6     Cardiovascular:  ACE Inhibitors Passed - 05/26/2023  2:51 PM      Passed - Cr in normal range and within 180 days    Creat  Date Value Ref Range Status  01/24/2023 1.18 0.70 - 1.35 mg/dL Final   Creatinine, Urine  Date Value Ref Range Status  07/12/2022 59 20 - 320 mg/dL Final         Passed - K in normal range and within 180 days    Potassium  Date Value Ref Range Status  01/24/2023 4.8 3.5 - 5.3 mmol/L Final         Passed - Patient is not pregnant      Passed - Last BP in normal range    BP Readings from Last 1 Encounters:  01/31/23 132/78         Passed - Valid encounter within last 6 months    Recent Outpatient Visits           2 weeks ago DM (diabetes mellitus), type 2 with neurological complications [facility])   [facility] Delles, Gentry Fitz A, RPH-CPP   3 months ago DM (diabetes mellitus), type 2 with neurological complications [facility])   [facility] [place], Netta Neat, DO   9 months ago DM (diabetes mellitus), type 2 with neurological complications [facility])   [place] [facility] Delles, Gentry Fitz A, RPH-CPP   10 months ago DM (diabetes mellitus), type 2 with neurological complications [facility])   [place] [facility] Delles, Jackelyn Poling, RPH-CPP   10 months ago Annual physical exam   [place] [facility] Smitty Cords, DO       Future Appointments             In 2 weeks [name], Verna Czech, MD [facility] [place]   In 1 month Althea Charon, Netta Neat, DO [place] [facility]

## 2023-06-12 ENCOUNTER — Other Ambulatory Visit: Payer: Self-pay

## 2023-06-12 DIAGNOSIS — Z87898 Personal history of other specified conditions: Secondary | ICD-10-CM

## 2023-06-12 DIAGNOSIS — N401 Enlarged prostate with lower urinary tract symptoms: Secondary | ICD-10-CM

## 2023-06-13 ENCOUNTER — Other Ambulatory Visit: Payer: Medicare HMO

## 2023-06-13 DIAGNOSIS — R350 Frequency of micturition: Secondary | ICD-10-CM | POA: Diagnosis not present

## 2023-06-13 DIAGNOSIS — Z87898 Personal history of other specified conditions: Secondary | ICD-10-CM | POA: Diagnosis not present

## 2023-06-13 DIAGNOSIS — N401 Enlarged prostate with lower urinary tract symptoms: Secondary | ICD-10-CM | POA: Diagnosis not present

## 2023-06-14 ENCOUNTER — Ambulatory Visit: Payer: Medicare HMO | Admitting: Urology

## 2023-06-14 ENCOUNTER — Encounter: Payer: Self-pay | Admitting: Urology

## 2023-06-14 VITALS — BP 161/80 | HR 75 | Ht 71.0 in | Wt 182.0 lb

## 2023-06-14 DIAGNOSIS — N5201 Erectile dysfunction due to arterial insufficiency: Secondary | ICD-10-CM

## 2023-06-14 DIAGNOSIS — Z87898 Personal history of other specified conditions: Secondary | ICD-10-CM

## 2023-06-14 DIAGNOSIS — N401 Enlarged prostate with lower urinary tract symptoms: Secondary | ICD-10-CM | POA: Diagnosis not present

## 2023-06-14 DIAGNOSIS — R350 Frequency of micturition: Secondary | ICD-10-CM

## 2023-06-14 LAB — PSA: Prostate Specific Ag, Serum: 1.3 ng/mL (ref 0.0–4.0)

## 2023-06-14 MED ORDER — SILDENAFIL CITRATE 20 MG PO TABS: ORAL_TABLET | ORAL | 1 refills | Status: DC

## 2023-06-14 MED ORDER — TAMSULOSIN HCL 0.4 MG PO CAPS: 0.8000 mg | ORAL_CAPSULE | Freq: Every day | ORAL | 3 refills | Status: DC

## 2023-06-14 NOTE — Progress Notes (Signed)
I, Maysun Anabel Bene, acting as a scribe for Riki Altes, MD., have documented all relevant documentation on the behalf of Riki Altes, MD, as directed by Riki Altes, MD while in the presence of Riki Altes, MD.  06/14/2023 2:09 PM   Richard Bellows Marines Sr. 10/18/1955 [id number]  Referring provider: Smitty Cords, DO [address] [place],  [place] [address]  Chief Complaint  Patient presents with   Elevated PSA   Urologic history: 1.  History elevated PSA Prostate biopsy 2007 Dr. Achilles Dunk; PSA not listed in record review Path ASAP Rebiopsy negative   2.  BPH with lower urinary tract symptoms Tamsulosin   3.  History urinary calculi History uric acid stones On allopurinol   4.  Erectile dysfunction On generic sildenafil  HPI: Richard AGRICOLA Sr. is a 67 y.o. male presents for annual follow-up.   Doing well since last visit Stable LUTS Denies dysuria, gross hematuria Sildenafil remains effective for his ED No flank, abdominal or pelvic pain PSA 06/13/2023 stable at 1.3  PSA trend   Prostate Specific Ag, Serum  Latest Ref Rng 0.0 - 4.0 ng/mL  06/11/2022 0.8   06/13/2023 1.3      PMH: Past Medical History:  Diagnosis Date   Anxiety    Cholelithiasis    DM (diabetes mellitus), type 2 with neurological complications (HCC)    GERD (gastroesophageal reflux disease)    Hyperlipidemia    Pancytopenia (HCC)    Seasonal allergic rhinitis due to other allergic trigger     Surgical History: Past Surgical History:  Procedure Laterality Date   Lung Collapse  67 years old.     Home Medications:  Allergies as of 06/14/2023       Reactions   Bee Venom Swelling   Itching         Medication List        Accurate as of June 14, 2023  2:09 PM. If you have any questions, ask your nurse or doctor.          allopurinol 300 MG tablet Commonly known as: ZYLOPRIM TAKE 1 TABLET BY MOUTH DAILY   Azelastine HCl 137 MCG/SPRAY Soln SPRAY ONE SPRAY  IN EACH NOSTRIL TWICE DAILY   benazepril 40 MG tablet Commonly known as: LOTENSIN TAKE 1 TABLET BY MOUTH EVERY MORNING AT 6   cyanocobalamin 1000 MCG tablet Commonly known as: VITAMIN B12 Take by mouth.   gabapentin 100 MG capsule Commonly known as: NEURONTIN TAKE ONE CAPSULE BY MOUTH THREE TIMES A DAY   ketoconazole 2 % shampoo Commonly known as: NIZORAL Apply 1 application topically 2 (two) times a week. As needed for scalp dermatitis   Magnesium Gluconate 500 (27 Mg) MG Tabs Take 1 tablet by mouth every evening.   melatonin 5 MG Tabs Take 1 tablet by mouth at bedtime.   MULTIPLE VITAMIN PO Take by mouth.   ONE TOUCH SURESOFT Misc Use as instructed to check blood sugars twice a day.   ONE TOUCH ULTRA 2 w/Device Kit Use as instructed to check blood sugars twice a day.   OneTouch Ultra test strip Generic drug: glucose blood Use as instructed to check blood sugars twice a day.   OT ULTRA/FASTTK CNTRL SOLN Soln Use as instructed to check blood sugars twice a day.   Ozempic (1 MG/DOSE) 4 MG/3ML Sopn Generic drug: Semaglutide (1 MG/DOSE) Inject 1 mg into the skin once a week.   rosuvastatin 5 MG tablet  Commonly known as: CRESTOR Take 1 tablet (5 mg total) by mouth at bedtime.   saw palmetto 160 MG capsule Take 1 capsule (160 mg total) by mouth 2 (two) times daily.   sertraline 50 MG tablet Commonly known as: ZOLOFT TAKE 1 TABLET BY MOUTH DAILY   sildenafil 20 MG tablet Commonly known as: REVATIO TAKE 3 TO 5 TABLETS BY MOUTH DAILY AS NEEDED   tamsulosin 0.4 MG Caps capsule Commonly known as: FLOMAX Take 2 capsules (0.8 mg total) by mouth daily.   traZODone 50 MG tablet Commonly known as: DESYREL TAKE 1 AND 1/2 TABLET BY MOUTH AT BEDTIME   zinc gluconate 50 MG tablet Take 50 mg by mouth daily.        Allergies:  Allergies  Allergen Reactions   Bee Venom Swelling    Itching     Family History: Family History  Problem Relation Age of Onset    Diabetes Father        complications of DM caused death   Heart attack Father    Diabetes Sister    Cancer Mother        lung   Lung cancer Mother     Social History:  reports that he quit smoking about 23 years ago. His smoking use included cigarettes. He started smoking about 33 years ago. He has quit using smokeless tobacco. He reports current alcohol use of about 6.0 standard drinks of alcohol per week. He reports that he does not use drugs.   Physical Exam: BP (!) 161/80   Pulse 75   Ht 5\' 11"  (1.803 m)   Wt 182 lb (82.6 kg)   BMI 25.38 kg/m   Constitutional:  Alert and oriented, No acute distress. HEENT: [place] AT, moist mucus membranes.  Trachea midline, no masses. Cardiovascular: No clubbing, cyanosis, or edema. Respiratory: Normal respiratory effort, no increased work of breathing. GI: Abdomen is soft, nontender, nondistended, no abdominal masses Skin: No rashes, bruises or suspicious lesions. Neurologic: Grossly intact, no focal deficits, moving all 4 extremities. Psychiatric: Normal mood and affect.   Assessment & Plan:    1. Benign prostatic hyperplasia with urinary frequency Stable  Tamsulosin refilled   2.  History elevated PSA Low and stable PSA   3.  Erectile dysfunction Stable Sildenafil refilled  [facility] [address] [place], [place] [address] [phone]

## 2023-06-21 ENCOUNTER — Telehealth: Payer: Self-pay | Admitting: Pharmacy Technician

## 2023-06-21 DIAGNOSIS — Z5986 Financial insecurity: Secondary | ICD-10-CM

## 2023-06-21 NOTE — Progress Notes (Signed)
Pharmacy Medication Assistance Program Note    06/21/2023  Patient ID: GUNER AVALO Sr., male   DOB: 19-Aug-1955, 67 y.o.   MRN: [id number]     06/21/2023  Outreach Medication One  Initial Outreach Date (Medication One) 05/10/2023  Manufacturer Medication One [employer] [facility] Ozempic  Dose of Ozempic 98m/3ml  Type of Radiographer, therapeutic Assistance  Date Application Sent to Patient 05/15/2023  Application Items Requested Application;Proof of Income;Other  Date Application Sent to Prescriber 05/15/2023  Name of Prescriber Saralyn Pilar  Date Application Received From Patient 06/20/2023  Application Items Received From Patient Application;Proof of Income;Other  Date Application Received From Provider 05/29/2023  Date Application Submitted to Manufacturer 06/21/2023  Method Application Sent to Manufacturer Fax     Signature Kristopher Glee [facility]  Office: [phone] Fax: [phone] Email: [email or web address]

## 2023-06-27 ENCOUNTER — Telehealth: Payer: Self-pay | Admitting: Pharmacy Technician

## 2023-06-27 DIAGNOSIS — Z5986 Financial insecurity: Secondary | ICD-10-CM

## 2023-06-27 NOTE — Progress Notes (Addendum)
Pharmacy Medication Assistance Program Note    06/27/2023  Patient ID: Richard ANDRE Sr., male   DOB: 1955/08/09, 67 y.o.   MRN: [id number]     06/21/2023 06/27/2023  Outreach Medication One  Initial Outreach Date (Medication One) 05/10/2023   Manufacturer Medication One [employer] [facility] Ozempic   Dose of Ozempic 35m/3ml   Type of Radiographer, therapeutic Assistance   Date Application Sent to Patient 05/15/2023   Application Items Requested Application;Proof of Income;Other   Date Application Sent to Prescriber 05/15/2023   Name of Prescriber Saralyn Pilar   Date Application Received From Patient 06/20/2023   Application Items Received From Patient Application;Proof of Income;Other   Date Application Received From Provider 05/29/2023   Date Application Submitted to Manufacturer 06/21/2023   Method Application Sent to Manufacturer Fax   Patient Assistance Determination  Approved  Approval Start Date  07/10/2023  Approval End Date  07/08/2024  Patient Notification Method  Telephone Call  Telephone Call Outcome  Successful  Additional Outreach Contact  Provider  Contacted Provider  Message    Care coordination call placed to [employer] in regard ot Ozempic application. Provider's office reached out as they received a fax stating patient's household size was missing. Spoke to [place] at [employer] and informed tax return was submitted and per tax reutrn , the household size is 2. Lou Cal was able to update this information and patient was APPROVED 07/10/23-07/08/24. She informs medication will auto fill and ship to the prescriber's office. Patient may call Novo Nordisk at any time to check on his next shipment by calling [phone]. Successful outreach to patient. HIPAA verified. Patient was notified of his approval.  Pattricia Boss, CPhT [place]  Office: [phone] Fax: [phone] Email: [email or web address]

## 2023-07-11 ENCOUNTER — Other Ambulatory Visit: Payer: Medicare HMO | Admitting: Family Medicine

## 2023-07-11 ENCOUNTER — Other Ambulatory Visit: Payer: Medicare HMO

## 2023-07-11 DIAGNOSIS — E1169 Type 2 diabetes mellitus with other specified complication: Secondary | ICD-10-CM

## 2023-07-11 DIAGNOSIS — Z0000 Encounter for general adult medical examination without abnormal findings: Secondary | ICD-10-CM

## 2023-07-11 DIAGNOSIS — N401 Enlarged prostate with lower urinary tract symptoms: Secondary | ICD-10-CM

## 2023-07-11 DIAGNOSIS — N138 Other obstructive and reflux uropathy: Secondary | ICD-10-CM

## 2023-07-11 DIAGNOSIS — D61818 Other pancytopenia: Secondary | ICD-10-CM

## 2023-07-11 DIAGNOSIS — E1149 Type 2 diabetes mellitus with other diabetic neurological complication: Secondary | ICD-10-CM

## 2023-07-11 DIAGNOSIS — I10 Essential (primary) hypertension: Secondary | ICD-10-CM

## 2023-07-12 LAB — HEMOGLOBIN A1C
Hgb A1c MFr Bld: 6 %{Hb} — ABNORMAL HIGH (ref <5.7)
Mean Plasma Glucose: 126 mg/dL
eAG (mmol/L): 7 mmol/L

## 2023-07-12 LAB — COMPLETE METABOLIC PANEL WITH GFR
AG Ratio: 1.4 (calc) (ref 1.0–2.5)
ALT: 32 U/L (ref 9–46)
AST: 40 U/L — ABNORMAL HIGH (ref 10–35)
Albumin: 4.3 g/dL (ref 3.6–5.1)
Alkaline phosphatase (APISO): 78 U/L (ref 35–144)
BUN: 22 mg/dL (ref 7–25)
CO2: 27 mmol/L (ref 20–32)
Calcium: 9.6 mg/dL (ref 8.6–10.3)
Chloride: 101 mmol/L (ref 98–110)
Creat: 1.22 mg/dL (ref 0.70–1.35)
Globulin: 3.1 g/dL (ref 1.9–3.7)
Glucose, Bld: 132 mg/dL — ABNORMAL HIGH (ref 65–99)
Potassium: 4.9 mmol/L (ref 3.5–5.3)
Sodium: 136 mmol/L (ref 135–146)
Total Bilirubin: 0.6 mg/dL (ref 0.2–1.2)
Total Protein: 7.4 g/dL (ref 6.1–8.1)
eGFR: 65 mL/min/{1.73_m2} (ref >=60)

## 2023-07-12 LAB — CBC WITH DIFFERENTIAL/PLATELET
Absolute Lymphocytes: 1049 {cells}/uL (ref 850–3900)
Absolute Monocytes: 441 {cells}/uL (ref 200–950)
Basophils Absolute: 39 {cells}/uL (ref 0–200)
Basophils Relative: 1 %
Eosinophils Absolute: 218 {cells}/uL (ref 15–500)
Eosinophils Relative: 5.6 %
HCT: 40.6 % (ref 38.5–50.0)
Hemoglobin: 13.8 g/dL (ref 13.2–17.1)
MCH: 34.1 pg — ABNORMAL HIGH (ref 27.0–33.0)
MCHC: 34 g/dL (ref 32.0–36.0)
MCV: 100.2 fL — ABNORMAL HIGH (ref 80.0–100.0)
MPV: 10.4 fL (ref 7.5–12.5)
Monocytes Relative: 11.3 %
Neutro Abs: 2153 {cells}/uL (ref 1500–7800)
Neutrophils Relative %: 55.2 %
Platelets: 157 10*3/uL (ref 140–400)
RBC: 4.05 10*6/uL — ABNORMAL LOW (ref 4.20–5.80)
RDW: 12.9 % (ref 11.0–15.0)
Total Lymphocyte: 26.9 %
WBC: 3.9 10*3/uL (ref 3.8–10.8)

## 2023-07-12 LAB — LIPID PANEL
Cholesterol: 152 mg/dL (ref <200)
HDL: 51 mg/dL (ref >=40)
LDL Cholesterol (Calc): 81 mg/dL
Non-HDL Cholesterol (Calc): 101 mg/dL (ref <130)
Total CHOL/HDL Ratio: 3 (calc) (ref <5.0)
Triglycerides: 103 mg/dL (ref <150)

## 2023-07-12 LAB — TSH: TSH: 3.15 m[IU]/L (ref 0.40–4.50)

## 2023-07-12 LAB — PSA: PSA: 0.99 ng/mL (ref <=4.00)

## 2023-07-17 ENCOUNTER — Ambulatory Visit (INDEPENDENT_AMBULATORY_CARE_PROVIDER_SITE_OTHER): Payer: Medicare HMO | Admitting: Family Medicine

## 2023-07-17 ENCOUNTER — Encounter: Payer: Self-pay | Admitting: Family Medicine

## 2023-07-17 VITALS — BP 134/68 | HR 65 | Ht 71.0 in | Wt 188.0 lb

## 2023-07-17 DIAGNOSIS — N401 Enlarged prostate with lower urinary tract symptoms: Secondary | ICD-10-CM

## 2023-07-17 DIAGNOSIS — N138 Other obstructive and reflux uropathy: Secondary | ICD-10-CM | POA: Diagnosis not present

## 2023-07-17 DIAGNOSIS — I10 Essential (primary) hypertension: Secondary | ICD-10-CM | POA: Diagnosis not present

## 2023-07-17 DIAGNOSIS — F339 Major depressive disorder, recurrent, unspecified: Secondary | ICD-10-CM

## 2023-07-17 DIAGNOSIS — E1149 Type 2 diabetes mellitus with other diabetic neurological complication: Secondary | ICD-10-CM | POA: Diagnosis not present

## 2023-07-17 DIAGNOSIS — Z0000 Encounter for general adult medical examination without abnormal findings: Secondary | ICD-10-CM | POA: Diagnosis not present

## 2023-07-17 DIAGNOSIS — E1169 Type 2 diabetes mellitus with other specified complication: Secondary | ICD-10-CM

## 2023-07-17 DIAGNOSIS — E785 Hyperlipidemia, unspecified: Secondary | ICD-10-CM | POA: Diagnosis not present

## 2023-07-17 MED ORDER — BENAZEPRIL HCL 40 MG PO TABS: 40.0000 mg | ORAL_TABLET | Freq: Every day | ORAL | 3 refills | Status: DC

## 2023-07-17 MED ORDER — TRAZODONE HCL 50 MG PO TABS: 75.0000 mg | ORAL_TABLET | Freq: Every day | ORAL | 3 refills | Status: DC

## 2023-07-17 NOTE — Assessment & Plan Note (Signed)
Controlled mood on SSRI and Trazodone On SSRI Sertraline 50mg  Continue Trazodone 75mg  nightly Follow-up as planned

## 2023-07-17 NOTE — Assessment & Plan Note (Signed)
 Controlled DM A1c 6.0 Complications - peripheral neuropathy, other including hyperlipidemia, GERD, depression - increases risk of future cardiovascular complications poor glucose control due to reduced lifestyle diet/exercise with low energy mood and fatigue - Failed Victoza . OFF Glimepiride  and NPH Insulin , Off Metformin   Plan:  1. CONTINUE dose for Ozepmic up to 1mg  weekly injection (PAP program) 2. Encourage improved lifestyle - low carb, low sugar diet, reduce portion size, continue improving regular exercise 3. Check CBG, bring log to next visit for review - For DM Neuropathy - keep on Gabapentin  - adjust dose - 300mg  QHS and maybe 100mg  AM instead of 100 TID - sinc waking up with neuropathy cramping symptoms - we can refer to Neurology next if need 4. Continue ASA, ACEi DM Foot, Urine microalbumin

## 2023-07-17 NOTE — Patient Instructions (Addendum)
 Thank you for coming to the office today.  Prevnar-20 vaccine at the pharmacy.  Recent Labs    01/24/23 0802 07/11/23 0836  HGBA1C 6.1* 6.0*    You have been referred for a Coronary Calcium  Score Cardiac CT Scan. This is a screening test for patients aged 69-50+ with cardiovascular risk factors or who are healthy but would be interested in Cardiovascular Screening for heart disease. Even if there is a family history of heart disease, this imaging can be useful. Typically it can be done every 5+ years or at a different timeline we agree on  The scan will look at the chest and mainly focus on the heart and identify early signs of calcium  build up or blockages within the heart arteries. It is not 100% accurate for identifying blockages or heart disease, but it is useful to help us  predict who may have some early changes or be at risk in the future for a heart attack or cardiovascular problem.  The results are reviewed by a Cardiologist and they will document the results. It should become available on MyChart. Typically the results are divided into percentiles based on other patients of the same demographic and age. So it will compare your risk to others similar to you. If you have a higher score >99 or higher percentile >75%tile, it is recommended to consider Statin cholesterol therapy and or referral to Cardiologist. I will try to help explain your results and if we have questions we can contact the Cardiologist.  You will be contacted for scheduling. Usually it is done at any imaging facility through [facility], [facility] or [facility].  The cost is $99 flat fee total and it does not go through insurance, so no authorization is required.   Please schedule a Follow-up Appointment to: Return for 6 month DM A1c.  If you have any other questions or concerns, please feel free to call the office or send a message through MyChart. You may also schedule an earlier  appointment if necessary.  Additionally, you may be receiving a survey about your experience at our office within a few days to 1 week by e-mail or mail. We value your feedback.  Marsa Officer, DO [facility], [place]

## 2023-07-17 NOTE — Assessment & Plan Note (Signed)
 Improved BPH LUTS on Saw palmetto Off Tamsulosin Followed by Dr Lonna Cobb

## 2023-07-17 NOTE — Assessment & Plan Note (Signed)
 Controlled on Statin  Plan: 1.Continue Rosuvastatin  5mg  daily - reconsider if causing muscle cramps in future 2. Continue ASA 81mg  for primary ASCVD risk reduction 3. Encourage improved lifestyle - low carb/cholesterol, reduce portion size, continue improving regular exercise

## 2023-07-17 NOTE — Progress Notes (Signed)
 Subjective:    Patient ID: Richard JAYSON Docker Sr., male    DOB: December 25, 1955, 68 y.o.   MRN: [id number]  Richard AMBLER Sr. is a 68 y.o. male presenting on 07/17/2023 for Annual Exam   HPI  Discussed the use of AI scribe software for clinical note transcription with the patient, who gave verbal consent to proceed.  History of Present Illness         CHRONIC DM, Type 2 with DM Neuropathy / BMI >26 Hypertension A1c improved to 6.0 - Today reports he continues to do very well at this time CBGs: Checks CBGs 1-3x daily Meds: - Ozempic  1mg  weekly (PAP program) - Remains off Metformin  - Failed Victoza  in past, OFF Glimepiride , OFF NPH Insulin  Reports good compliance. Tolerating well w/o side-effects Currently on ACEi Lifestyle: - Diet (improved diet overall still - reduced portions) - Exercise (increased activity and exercise) Chronic issue with known, Bilateral feet, mild loss of sensitivity Taking Vitamin B12 Magnesium, MVI, Melatonin. Admits tingling and neuropathy in feet bilateral. Takes OTC medicine for neuropathy On gabapentin  100mg  TID limited relief Has cataract, considering procedure Denies hypoglycemia, polyuria, visual changes or tingling.    Sinusitis, allergic Chronic year round, tried allergy pills and OTC nasal sprays but has rebound.   Major Depression, recurrent in remission / Insomnia Reports his mood has been good overall without new concerns. Continues on SSRI Sertraline  50mg  daily with great results Continues on Trazodone  1.5 pills for 75mg  nightly for insomnia.   HYPERLIPIDEMIA: - Reports no concerns. Last lipid panel 07/2023 Controlled On Rosuvastatin  5mg  nightly not causing side effects   CHRONIC HTN: Reports no concern Current Meds - Benazepril  40mg  daily   Reports good compliance, took meds today. Tolerating well, w/o complaints. Denies CP, dyspnea, HA, edema, dizziness / lightheadedness   Alcohol dependence, uncomplicated History of heavy drinking  with liquor He has resolved that and now reduced significantly He is consuming beer daily 6 drinks per day average.   LFT improved AST Sodium improved on lab.       Health Maintenance:   Recommend Prevnar-20 at pharmacy  TDap 2015   Updated Shingrix  at pharmacy  Last colonoscopy approx 2008. - Cologuard done 02/07/23 - negative, good for 3 years, next 2027  PSA 0.99 negative     07/17/2023    8:46 AM 03/08/2023   10:01 AM 01/31/2023    9:04 AM  Depression screen PHQ 2/9  Decreased Interest 0 0 0  Down, Depressed, Hopeless 0 0 0  PHQ - 2 Score 0 0 0  Altered sleeping  0   Tired, decreased energy  0   Change in appetite  0   Feeling bad or failure about yourself   0   Trouble concentrating  0   Moving slowly or fidgety/restless  0   Suicidal thoughts  0   PHQ-9 Score  0   Difficult doing work/chores  Not difficult at all        07/17/2023    8:46 AM 01/31/2023    9:04 AM 09/20/2021    8:09 AM 02/26/2018    9:05 AM  GAD 7 : Generalized Anxiety Score  Nervous, Anxious, on Edge 0 0 0 0  Control/stop worrying 0 0 0 0  Worry too much - different things 0 0 0 0  Trouble relaxing 0 0 0 0  Restless 0 0 0 0  Easily annoyed or irritable 0 0 0 0  Afraid - awful might happen 0 0  0 0  Total GAD 7 Score 0 0 0 0  Anxiety Difficulty   Not difficult at all Not difficult at all     Past Medical History:  Diagnosis Date   Anxiety    Cholelithiasis    DM (diabetes mellitus), type 2 with neurological complications (HCC)    GERD (gastroesophageal reflux disease)    Hyperlipidemia    Pancytopenia (HCC)    Seasonal allergic rhinitis due to other allergic trigger    Past Surgical History:  Procedure Laterality Date   Lung Collapse  68 years old.    Social History   Socioeconomic History   Marital status: Married    Spouse name: Gerrett Loman   Number of children: 2   Years of education: Not on file   Highest education level: Some college, no degree  Occupational History    Occupation: retired  Tobacco Use   Smoking status: Former    Current packs/day: 0.00    Types: Cigarettes    Start date: 08/07/1989    Quit date: 08/08/1999    Years since quitting: 23.9   Smokeless tobacco: Former  Building Services Engineer status: Never Used  Substance and Sexual Activity   Alcohol use: Yes    Alcohol/week: 6.0 standard drinks of alcohol    Types: 6 Cans of beer per week   Drug use: No   Sexual activity: Yes  Other Topics Concern   Not on file  Social History Narrative   Not on file   Social Drivers of Health   Financial Resource Strain: Low Risk  (03/08/2023)   Overall Financial Resource Strain (CARDIA)    Difficulty of Paying Living Expenses: Not hard at all  Food Insecurity: No Food Insecurity (03/08/2023)   Hunger Vital Sign    Worried About Running Out of Food in the Last Year: Never true    Ran Out of Food in the Last Year: Never true  Transportation Needs: No Transportation Needs (03/08/2023)   PRAPARE - Administrator, Civil Service (Medical): No    Lack of Transportation (Non-Medical): No  Physical Activity: Sufficiently Active (03/08/2023)   Exercise Vital Sign    Days of Exercise per Week: 3 days    Minutes of Exercise per Session: 60 min  Stress: No Stress Concern Present (03/08/2023)   [employer] of Occupational Health - Occupational Stress Questionnaire    Feeling of Stress : Not at all  Social Connections: Moderately Isolated (03/08/2023)   Social Connection and Isolation Panel [NHANES]    Frequency of Communication with Friends and Family: Three times a week    Frequency of Social Gatherings with Friends and Family: Once a week    Attends Religious Services: Never    Database Administrator or Organizations: No    Attends Banker Meetings: Never    Marital Status: Married  Catering Manager Violence: Not At Risk (03/08/2023)   Humiliation, Afraid, Rape, and Kick questionnaire    Fear of Current or Ex-Partner: No     Emotionally Abused: No    Physically Abused: No    Sexually Abused: No   Family History  Problem Relation Age of Onset   Diabetes Father        complications of DM caused death   Heart attack Father    Diabetes Sister    Cancer Mother        lung   Lung cancer Mother    Current Outpatient Medications on File  Prior to Visit  Medication Sig   allopurinol  (ZYLOPRIM ) 300 MG tablet TAKE 1 TABLET BY MOUTH DAILY   Azelastine  HCl 137 MCG/SPRAY SOLN SPRAY ONE SPRAY IN EACH NOSTRIL TWICE DAILY   Blood Glucose Calibration (OT ULTRA/FASTTK CNTRL SOLN) SOLN Use as instructed to check blood sugars twice a day.   Blood Glucose Monitoring Suppl (ONE TOUCH ULTRA 2) w/Device KIT Use as instructed to check blood sugars twice a day.   gabapentin  (NEURONTIN ) 100 MG capsule TAKE ONE CAPSULE BY MOUTH THREE TIMES A DAY   ketoconazole  (NIZORAL ) 2 % shampoo Apply 1 application topically 2 (two) times a week. As needed for scalp dermatitis   Lancets Misc. (ONE TOUCH SURESOFT) MISC Use as instructed to check blood sugars twice a day.   Magnesium Gluconate 500 (27 MG) MG TABS Take 1 tablet by mouth every evening.    melatonin 5 MG TABS Take 1 tablet by mouth at bedtime.    MULTIPLE VITAMIN PO Take by mouth.   ONETOUCH ULTRA test strip Use as instructed to check blood sugars twice a day.   OZEMPIC , 1 MG/DOSE, 4 MG/3ML SOPN Inject 1 mg into the skin once a week.   rosuvastatin  (CRESTOR ) 5 MG tablet Take 1 tablet (5 mg total) by mouth at bedtime.   saw palmetto  160 MG capsule Take 1 capsule (160 mg total) by mouth 2 (two) times daily.   sertraline  (ZOLOFT ) 50 MG tablet TAKE 1 TABLET BY MOUTH DAILY   sildenafil  (REVATIO ) 20 MG tablet TAKE 3 TO 5 TABLETS BY MOUTH DAILY AS NEEDED   tamsulosin  (FLOMAX ) 0.4 MG CAPS capsule Take 2 capsules (0.8 mg total) by mouth daily.   vitamin B-12 (CYANOCOBALAMIN ) 1000 MCG tablet Take by mouth.   zinc gluconate 50 MG tablet Take 50 mg by mouth daily.   No current  facility-administered medications on file prior to visit.    Review of Systems  Constitutional:  Negative for activity change, appetite change, chills, diaphoresis, fatigue and fever.  HENT:  Negative for congestion and hearing loss.   Eyes:  Negative for visual disturbance.  Respiratory:  Negative for cough, chest tightness, shortness of breath and wheezing.   Cardiovascular:  Negative for chest pain, palpitations and leg swelling.  Gastrointestinal:  Negative for abdominal pain, constipation, diarrhea, nausea and vomiting.  Genitourinary:  Negative for dysuria, frequency and hematuria.  Musculoskeletal:  Negative for arthralgias and neck pain.  Skin:  Negative for rash.  Neurological:  Negative for dizziness, weakness, light-headedness, numbness and headaches.  Hematological:  Negative for adenopathy.  Psychiatric/Behavioral:  Negative for behavioral problems, dysphoric mood and sleep disturbance.    Per HPI unless specifically indicated above     Objective:    BP 134/68 (BP Location: Left Arm, Cuff Size: Normal)   Pulse 65   Ht 5' 11 (1.803 m)   Wt 188 lb (85.3 kg)   SpO2 99%   BMI 26.22 kg/m   Wt Readings from Last 3 Encounters:  07/17/23 188 lb (85.3 kg)  06/14/23 182 lb (82.6 kg)  01/31/23 183 lb (83 kg)    Physical Exam Vitals and nursing note reviewed.  Constitutional:      General: He is not in acute distress.    Appearance: He is well-developed. He is not diaphoretic.     Comments: Well-appearing, comfortable, cooperative  HENT:     Head: Normocephalic and atraumatic.  Eyes:     General:        Right eye: No discharge.  Left eye: No discharge.     Conjunctiva/sclera: Conjunctivae normal.     Pupils: Pupils are equal, round, and reactive to light.  Neck:     Thyroid: No thyromegaly.     Vascular: No carotid bruit.  Cardiovascular:     Rate and Rhythm: Normal rate and regular rhythm.     Pulses: Normal pulses.     Heart sounds: Normal heart sounds.  No murmur heard. Pulmonary:     Effort: Pulmonary effort is normal. No respiratory distress.     Breath sounds: Normal breath sounds. No wheezing or rales.  Abdominal:     General: Bowel sounds are normal. There is no distension.     Palpations: Abdomen is soft. There is no mass.     Tenderness: There is no abdominal tenderness.  Musculoskeletal:        General: No tenderness. Normal range of motion.     Cervical back: Normal range of motion and neck supple.     Right lower leg: No edema.     Left lower leg: No edema.     Comments: Upper / Lower Extremities: - Normal muscle tone, strength bilateral upper extremities 5/5, lower extremities 5/5  Lymphadenopathy:     Cervical: No cervical adenopathy.  Skin:    General: Skin is warm and dry.     Findings: No erythema or rash.  Neurological:     Mental Status: He is alert and oriented to person, place, and time.     Comments: Distal sensation intact to light touch all extremities  Psychiatric:        Mood and Affect: Mood normal.        Behavior: Behavior normal.        Thought Content: Thought content normal.     Comments: Well groomed, good eye contact, normal speech and thoughts     Diabetic Foot Exam - Simple   Simple Foot Form Diabetic Foot exam was performed with the following findings: Yes 07/17/2023  9:11 AM  Visual Inspection See comments: Yes Sensation Testing See comments: Yes Pulse Check Posterior Tibialis and Dorsalis pulse intact bilaterally: Yes Comments Bilateral toenail thickening. No ulceration. Mild callus formation forefoot and heel. Mostly intact to monofilament sensation. Bilateral great toes reduced sensation.      Results for orders placed or performed in visit on 07/11/23  TSH   Collection Time: 07/11/23  8:36 AM  Result Value Ref Range   TSH 3.15 0.40 - 4.50 mIU/L  PSA   Collection Time: 07/11/23  8:36 AM  Result Value Ref Range   PSA 0.99 < OR = 4.00 ng/mL  Lipid panel   Collection Time:  07/11/23  8:36 AM  Result Value Ref Range   Cholesterol 152 <200 mg/dL   HDL 51 > OR = 40 mg/dL   Triglycerides 896 <849 mg/dL   LDL Cholesterol (Calc) 81 mg/dL (calc)   Total CHOL/HDL Ratio 3.0 <5.0 (calc)   Non-HDL Cholesterol (Calc) 101 <130 mg/dL (calc)  CBC with Differential/Platelet   Collection Time: 07/11/23  8:36 AM  Result Value Ref Range   WBC 3.9 3.8 - 10.8 Thousand/uL   RBC 4.05 (L) 4.20 - 5.80 Million/uL   Hemoglobin 13.8 13.2 - 17.1 g/dL   HCT 59.3 61.4 - 49.9 %   MCV 100.2 (H) 80.0 - 100.0 fL   MCH 34.1 (H) 27.0 - 33.0 pg   MCHC 34.0 32.0 - 36.0 g/dL   RDW 87.0 88.9 - 84.9 %   Platelets  157 140 - 400 Thousand/uL   MPV 10.4 7.5 - 12.5 fL   Neutro Abs 2,153 1,500 - 7,800 cells/uL   Absolute Lymphocytes 1,049 850 - 3,900 cells/uL   Absolute Monocytes 441 200 - 950 cells/uL   Eosinophils Absolute 218 15 - 500 cells/uL   Basophils Absolute 39 0 - 200 cells/uL   Neutrophils Relative % 55.2 %   Total Lymphocyte 26.9 %   Monocytes Relative 11.3 %   Eosinophils Relative 5.6 %   Basophils Relative 1.0 %  Hemoglobin A1c   Collection Time: 07/11/23  8:36 AM  Result Value Ref Range   Hgb A1c MFr Bld 6.0 (H) <5.7 % of total Hgb   Mean Plasma Glucose 126 mg/dL   eAG (mmol/L) 7.0 mmol/L  COMPLETE METABOLIC PANEL WITH GFR   Collection Time: 07/11/23  8:36 AM  Result Value Ref Range   Glucose, Bld 132 (H) 65 - 99 mg/dL   BUN 22 7 - 25 mg/dL   Creat 8.77 9.29 - 8.64 mg/dL   eGFR 65 > OR = 60 fO/fpw/8.26f7   BUN/Creatinine Ratio SEE NOTE: 6 - 22 (calc)   Sodium 136 135 - 146 mmol/L   Potassium 4.9 3.5 - 5.3 mmol/L   Chloride 101 98 - 110 mmol/L   CO2 27 20 - 32 mmol/L   Calcium  9.6 8.6 - 10.3 mg/dL   Total Protein 7.4 6.1 - 8.1 g/dL   Albumin 4.3 3.6 - 5.1 g/dL   Globulin 3.1 1.9 - 3.7 g/dL (calc)   AG Ratio 1.4 1.0 - 2.5 (calc)   Total Bilirubin 0.6 0.2 - 1.2 mg/dL   Alkaline phosphatase (APISO) 78 35 - 144 U/L   AST 40 (H) 10 - 35 U/L   ALT 32 9 - 46 U/L       Assessment & Plan:   Problem List Items Addressed This Visit     Benign prostatic hyperplasia with urinary obstruction   Improved BPH LUTS on Saw palmetto  Off Tamsulosin  Followed by Dr Twylla      Relevant Orders   CT CARDIAC SCORING (SELF PAY ONLY)   DM (diabetes mellitus), type 2 with neurological complications (HCC)   Controlled DM A1c 6.0 Complications - peripheral neuropathy, other including hyperlipidemia, GERD, depression - increases risk of future cardiovascular complications poor glucose control due to reduced lifestyle diet/exercise with low energy mood and fatigue - Failed Victoza . OFF Glimepiride  and NPH Insulin , Off Metformin   Plan:  1. CONTINUE dose for Ozepmic up to 1mg  weekly injection (PAP program) 2. Encourage improved lifestyle - low carb, low sugar diet, reduce portion size, continue improving regular exercise 3. Check CBG, bring log to next visit for review - For DM Neuropathy - keep on Gabapentin  - adjust dose - 300mg  QHS and maybe 100mg  AM instead of 100 TID - sinc waking up with neuropathy cramping symptoms - we can refer to Neurology next if need 4. Continue ASA, ACEi DM Foot, Urine microalbumin      Relevant Medications   benazepril  (LOTENSIN ) 40 MG tablet   Other Relevant Orders   Urine Microalbumin w/creat. ratio   CT CARDIAC SCORING (SELF PAY ONLY)   Essential hypertension   Controlled HYPERTENSION. Note initial mild elevated reading No known complications     Plan:  1. Continue current BP regimen Benazepril  40mg  daily  2. Encourage improved lifestyle - low sodium diet, regular exercise 3. Continue monitor BP outside office, bring readings to next visit, if persistently >140/90 or new symptoms notify  office sooner      Relevant Medications   benazepril  (LOTENSIN ) 40 MG tablet   Hyperlipidemia associated with type 2 diabetes mellitus (HCC)   Controlled on Statin  Plan: 1.Continue Rosuvastatin  5mg  daily - reconsider if causing muscle  cramps in future 2. Continue ASA 81mg  for primary ASCVD risk reduction 3. Encourage improved lifestyle - low carb/cholesterol, reduce portion size, continue improving regular exercise      Relevant Medications   benazepril  (LOTENSIN ) 40 MG tablet   Other Relevant Orders   CT CARDIAC SCORING (SELF PAY ONLY)   Major depression, recurrent, chronic (HCC)   Controlled mood on SSRI and Trazodone  On SSRI Sertraline  50mg  Continue Trazodone  75mg  nightly Follow-up as planned      Relevant Medications   traZODone  (DESYREL ) 50 MG tablet   Other Visit Diagnoses       Annual physical exam    -  Primary        Updated Health Maintenance information Reviewed recent lab results with patient Encouraged improvement to lifestyle with diet and exercise Goal of weight loss   General Health Maintenance -Recommend  Prevnar 20 vaccine at the pharmacy. -Perform heart scan to assess for any blockage in the coronary arteries. -Schedule follow-up appointment for July 10th.         Orders Placed This Encounter  Procedures   CT CARDIAC SCORING (SELF PAY ONLY)    Standing Status:   Future    Expiration Date:   07/16/2024    Preferred imaging location?:   [facility]   Urine Microalbumin w/creat. ratio    Meds ordered this encounter  Medications   benazepril  (LOTENSIN ) 40 MG tablet    Sig: Take 1 tablet (40 mg total) by mouth daily.    Dispense:  90 tablet    Refill:  3    Future refills   traZODone  (DESYREL ) 50 MG tablet    Sig: Take 1.5 tablets (75 mg total) by mouth at bedtime.    Dispense:  135 tablet    Refill:  3    Future refills     Follow up plan: Return for 6 month DM A1c.  Marsa Officer, DO [facility] 07/17/2023, 8:58 AM

## 2023-07-17 NOTE — Assessment & Plan Note (Signed)
 Controlled HYPERTENSION. Note initial mild elevated reading No known complications     Plan:  1. Continue current BP regimen Benazepril  40mg  daily  2. Encourage improved lifestyle - low sodium diet, regular exercise 3. Continue monitor BP outside office, bring readings to next visit, if persistently >140/90 or new symptoms notify office sooner

## 2023-07-18 LAB — MICROALBUMIN / CREATININE URINE RATIO
Creatinine, Urine: 145 mg/dL (ref 20–320)
Microalb Creat Ratio: 22 mg/g{creat} (ref <30)
Microalb, Ur: 3.2 mg/dL

## 2023-07-19 ENCOUNTER — Ambulatory Visit
Admission: RE | Admit: 2023-07-19 | Discharge: 2023-07-19 | Disposition: A | Discharge status: Home / Self Care | Payer: Self-pay | Source: Ambulatory Visit | Attending: Family Medicine | Admitting: Family Medicine

## 2023-07-19 DIAGNOSIS — N138 Other obstructive and reflux uropathy: Secondary | ICD-10-CM | POA: Insufficient documentation

## 2023-07-19 DIAGNOSIS — N401 Enlarged prostate with lower urinary tract symptoms: Secondary | ICD-10-CM | POA: Insufficient documentation

## 2023-07-19 DIAGNOSIS — E785 Hyperlipidemia, unspecified: Secondary | ICD-10-CM | POA: Insufficient documentation

## 2023-07-19 DIAGNOSIS — E1169 Type 2 diabetes mellitus with other specified complication: Secondary | ICD-10-CM | POA: Insufficient documentation

## 2023-07-19 DIAGNOSIS — E1149 Type 2 diabetes mellitus with other diabetic neurological complication: Secondary | ICD-10-CM | POA: Insufficient documentation

## 2023-07-22 ENCOUNTER — Other Ambulatory Visit: Payer: Self-pay | Admitting: Family Medicine

## 2023-07-22 DIAGNOSIS — E1149 Type 2 diabetes mellitus with other diabetic neurological complication: Secondary | ICD-10-CM

## 2023-07-23 NOTE — Telephone Encounter (Signed)
 Requested Prescriptions  Pending Prescriptions Disp Refills   gabapentin  (NEURONTIN ) 100 MG capsule [Pharmacy Med Name: GABAPENTIN  100 MG CAPSULE] 270 capsule 3    Sig: TAKE ONE CAPSULE BY MOUTH THREE TIMES A DAY     Neurology: Anticonvulsants - gabapentin  Passed - 07/23/2023  1:52 PM      Passed - Cr in normal range and within 360 days    Creat  Date Value Ref Range Status  07/11/2023 1.22 0.70 - 1.35 mg/dL Final   Creatinine, Urine  Date Value Ref Range Status  07/17/2023 145 20 - 320 mg/dL Final         Passed - Completed PHQ-2 or PHQ-9 in the last 360 days      Passed - Valid encounter within last 12 months    Recent Outpatient Visits           6 days ago Annual physical exam   [facility] [place], Marsa PARAS, DO   2 months ago DM (diabetes mellitus), type 2 with neurological complications [facility])   [facility] Delles, Sharyle A, RPH-CPP   5 months ago DM (diabetes mellitus), type 2 with neurological complications [facility])   [facility], Marsa PARAS, DO   11 months ago DM (diabetes mellitus), type 2 with neurological complications [facility])   [facility] Delles, Sharyle A, RPH-CPP   11 months ago DM (diabetes mellitus), type 2 with neurological complications [facility])   [facility] Delles, Sharyle LABOR, RPH-CPP       Future Appointments             In 5 months Edman, Marsa PARAS, DO [facility]   In 11 months [name], Glendia BROCKS, MD [facility] [place]

## 2023-07-24 ENCOUNTER — Other Ambulatory Visit: Payer: Self-pay | Admitting: Family Medicine

## 2023-07-24 DIAGNOSIS — R931 Abnormal findings on diagnostic imaging of heart and coronary circulation: Secondary | ICD-10-CM

## 2023-07-25 ENCOUNTER — Encounter: Payer: Self-pay | Admitting: Family Medicine

## 2023-07-26 ENCOUNTER — Encounter: Payer: Self-pay | Admitting: Family Medicine

## 2023-07-31 ENCOUNTER — Other Ambulatory Visit: Payer: Self-pay | Admitting: Family Medicine

## 2023-07-31 DIAGNOSIS — R911 Solitary pulmonary nodule: Secondary | ICD-10-CM

## 2023-08-05 ENCOUNTER — Other Ambulatory Visit: Payer: Medicare HMO | Admitting: Pharmacist

## 2023-08-05 DIAGNOSIS — E1149 Type 2 diabetes mellitus with other diabetic neurological complication: Secondary | ICD-10-CM

## 2023-08-05 NOTE — Progress Notes (Signed)
08/05/2023 Name: Richard Bellows Shiflett Sr. MRN: [id number] DOB: 15-Jul-1955  Chief Complaint  Patient presents with   Medication Assistance   Medication Management    Richard GLAUDE Sr. is a 68 y.o. year old male who presented for a telephone visit.   They were referred to the pharmacist by their PCP for assistance in managing medication access.      Subjective:   Care Team: Primary Care Provider: Smitty Cords, DO ; Next Scheduled Visit: 01/16/2024 Urology: Riki Altes, MD Cardiology: Antonieta Iba, MD; Initial Scheduled Visit: 09/10/2023   Medication Access/Adherence  Current Pharmacy:  [facility] [id number] [place], [place] - [address] [place] [address] Phone: [phone] Fax: [phone]   Patient reports affordability concerns with their medications: No  Patient reports access/transportation concerns to their pharmacy: No  Patient reports adherence concerns with their medications:  No        Diabetes:   Current medications: Ozempic 1 mg weekly on Thursdays Medications tried in the past: metformin   Reports continues to tolerate Ozempic well   Current glucose readings: recalls recent fasting readings ranging 100-140   Denies symptoms of hypoglycemia   Reports having regular well-balanced meals, while controlling carbohydrate portion sizes   Current physical activity: reports staying active throughout the day   Statin: rosuvastatin 5 mg daily   Current medication access support: Enrolled in patient assistance for Ozempic patient assistance program through [employer] through 07/08/2024   Objective:  Lab Results  Component Value Date   HGBA1C 6.0 (H) 07/11/2023    Lab Results  Component Value Date   CREATININE 1.22 07/11/2023   BUN 22 07/11/2023   NA 136 07/11/2023   K 4.9 07/11/2023   CL 101 07/11/2023   CO2 27 07/11/2023    Lab Results  Component Value Date   CHOL 152 07/11/2023   HDL 51  07/11/2023   LDLCALC 81 07/11/2023   TRIG 103 07/11/2023   CHOLHDL 3.0 07/11/2023    Medications Reviewed Today     Reviewed by Manuela Neptune, RPH-CPP (Pharmacist) on 08/05/23 at 0848  Med List Status: <None>   Medication Order Taking? Sig Documenting Provider Last Dose Status Informant  allopurinol (ZYLOPRIM) 300 MG tablet [id number]  TAKE 1 TABLET BY MOUTH DAILY [name], Verna Czech, MD  Active   Azelastine HCl 137 MCG/SPRAY SOLN [id number]  SPRAY ONE SPRAY IN EACH NOSTRIL TWICE DAILY [name], Netta Neat, DO  Active   benazepril (LOTENSIN) 40 MG tablet [id number]  Take 1 tablet (40 mg total) by mouth daily. Smitty Cords, DO  Active   Blood Glucose Calibration (OT ULTRA/FASTTK CNTRL SOLN) SOLN [id number]  Use as instructed to check blood sugars twice a day. [name], Netta Neat, DO  Active   Blood Glucose Monitoring Suppl (ONE TOUCH ULTRA 2) w/Device KIT [id number]  Use as instructed to check blood sugars twice a day. Smitty Cords, DO  Active   gabapentin (NEURONTIN) 100 MG capsule [id number]  TAKE ONE CAPSULE BY MOUTH THREE TIMES A DAY [name], Alexander Shela Commons, DO  Active   ketoconazole (NIZORAL) 2 % shampoo [id number]  Apply 1 application topically 2 (two) times a week. As needed for scalp dermatitis [name], Netta Neat, DO  Active   Lancets Misc. (ONE TOUCH SURESOFT) MISC [id number]  Use as instructed to check blood sugars twice a day. Smitty Cords, DO  Active   Magnesium Gluconate 500 (27 MG) MG TABS [id number]  Take 1 tablet by mouth every evening.  Janeann Forehand., MD  Active            Med Note Kelli Churn, [facility] M   Thu Jan 31, 2023  8:25 AM)    melatonin 5 MG TABS [id number]  Take 1 tablet by mouth at bedtime.  [provider]  Active   MULTIPLE VITAMIN PO [id number]  Take by mouth. Janeann Forehand., MD  Active            Med Note Kelli Churn, [facility] M   Thu Jan 31, 2023  8:25 AM)    Koren Bound test strip [id number]  Use as  instructed to check blood sugars twice a day. Smitty Cords, DO  Active   OZEMPIC, 1 MG/DOSE, 4 MG/3ML SOPN [id number] Yes Inject 1 mg into the skin once a week. Smitty Cords, DO Taking Active   rosuvastatin (CRESTOR) 5 MG tablet [id number] Yes Take 1 tablet (5 mg total) by mouth at bedtime. Smitty Cords, DO Taking Active   saw palmetto 160 MG capsule [id number]  Take 1 capsule (160 mg total) by mouth 2 (two) times daily. Smitty Cords, DO  Active   sertraline (ZOLOFT) 50 MG tablet [id number]  TAKE 1 TABLET BY MOUTH DAILY Smitty Cords, DO  Active   sildenafil (REVATIO) 20 MG tablet [id number]  TAKE 3 TO 5 TABLETS BY MOUTH DAILY AS NEEDED [name], Scott C, MD  Active   tamsulosin (FLOMAX) 0.4 MG CAPS capsule [id number]  Take 2 capsules (0.8 mg total) by mouth daily. Riki Altes, MD  Active   traZODone (DESYREL) 50 MG tablet [id number]  Take 1.5 tablets (75 mg total) by mouth at bedtime. Smitty Cords, DO  Active   vitamin B-12 (CYANOCOBALAMIN) 1000 MCG tablet [id number]  Take by mouth. [provider]  Active   zinc gluconate 50 MG tablet [id number]  Take 50 mg by mouth daily. [provider]  Active               Assessment/Plan:   Patient plans to contact Pulmonology for scheduling with Pulmonary Nodule Clinic as referred by PCP on 07/31/2023  Diabetes: - Currently controlled - Discuss importance of having regular well-balanced meals, while controlling carbohydrate portion sizes - Encourage patient to continue to monitor home blood sugar, keep log of results and have this record to review at medical appointments Patient to contact office or clinical pharmacist sooner if needed for readings outside of established parameters or new symptoms - Patient to follow up with PCP regarding return of back pain symptoms - Patient to follow up with [employer] regarding as needed for refills of his Ozempic        Follow Up Plan: Clinical Pharmacist to follow up with patient by telephone on 04/20/2024 at 8:30 AM    Estelle Grumbles, PharmD, [place], CPP Clinical Pharmacist [facility] [phone]

## 2023-08-05 NOTE — Patient Instructions (Signed)
Goals Addressed             This Visit's Progress    Pharmacy Goals       If you need to reach out to patient assistance programs regarding refills or to find out the status of your application, you can do so by calling:  [employer] at [phone]  Our goal A1c is less than 7%. This corresponds with fasting sugars less than 130 and 2 hour after meal sugars less than 180. Please continue keep a log of results when you check your blood sugar at home.  Feel free to call me with any questions or concerns. I look forward to our next call!  Estelle Grumbles, PharmD, [facility] Clinical Pharmacist [facility] [phone]

## 2023-08-22 ENCOUNTER — Ambulatory Visit (INDEPENDENT_AMBULATORY_CARE_PROVIDER_SITE_OTHER): Payer: Medicare HMO | Admitting: Student in an Organized Health Care Education/Training Program

## 2023-08-22 ENCOUNTER — Encounter: Payer: Self-pay | Admitting: Student in an Organized Health Care Education/Training Program

## 2023-08-22 VITALS — BP 136/70 | HR 68 | Temp 97.5°F | Resp 16 | Ht 71.0 in | Wt 188.0 lb

## 2023-08-22 DIAGNOSIS — R911 Solitary pulmonary nodule: Secondary | ICD-10-CM

## 2023-08-22 DIAGNOSIS — R0602 Shortness of breath: Secondary | ICD-10-CM | POA: Diagnosis not present

## 2023-08-22 NOTE — Progress Notes (Unsigned)
Synopsis: Referred in for pulmonary nodule by Richard Day *  Assessment & Plan:   1. Lung nodule  Nodule Location: LLL Nodule Size: 9 mm Nodule Spiculation: No Associated Lymphadenopathy: No Smoking Status (former) and pack years 40 Extrathoracic cancer > 5 years prior (no) SPN malignancy risk score [facility]): 9.7 %risk of malignancy ECOG: 0  The patient is here to discuss their imaging abnormalities which include a nodule in the left lower lobe on this partial CT scan of the coronaries.  Discussed with the patient that the size and appearance of the nodule with his smoking history does increase his risk for malignancy.  Discussed that risk of malignancy is around 9.7% based on the [facility] model (sans PET/CT).  Explained that the next steps in his workup would include obtaining a PET/CT for further risk stratification of the nodule.  Also explained that we will likely require a biopsy to ascertain the etiology behind this nodule.  I also discussed with him that there is potential role for referral to thoracic surgery for resection without a biopsy.  Explained different modalities of obtaining a biopsy including bronchoscopically versus CT-guided with IR.  Explained risks and benefits of both, including pneumothorax and bleeding.  Should the PET/CT return with either an enlarging nodule or with PET avidity, I will favor proceeding with robotic assisted navigational bronchoscopy to attempt a biopsy.  I also discussed with the patient that the location of the nodule distally in the left lower lobe does increase the risk of atelectasis and potential sampling error during bronchoscopy. Will start with a PET/CT but hold off on further CT imaging in an effort to minimize radiation.  He will require dedicated super D CT scan for procedural planning in the near future.  We discussed the importance of diagnosis and staging in lung malignancies, and the approach to obtaining a  tissue diagnosis which would include robotic assisted navigational bronchoscopy with endobronchial ultrasound guided sampling.  We also discussed the risks associated with the procedure which include a 2% risk of pneumothorax, infection, bleeding, and nondiagnostic procedure in detail.  I explained that patients typically are able to return home the same day of the procedure, but in rare cases admission to the hospital for observation and treatment is required.  Recommendations:  - NM PET Image Initial (PI) Skull Base To Thigh (F-18 FDG); Future - Pulmonary Function Test [facility] Only; Future - Will consider robotic assisted navigational bronchoscopy after PET/CT  2. Shortness of breath  Does have symptoms of shortness of breath and cough productive of sputum for a few years in the setting of significant smoking history. This does increase his risk for COPD. Will obtain pulmonary function testing to assess spirometry for obstruction, as well as lung volumes and DLCO should he require surgical resection.  - Pulmonary Function Test [facility] Only; Future   Return in about 1 week (around 08/29/2023). After PET/CT  I spent 60 minutes caring for this patient today, including preparing to see the patient, obtaining a medical history , reviewing a separately obtained history, performing a medically appropriate examination and/or evaluation, counseling and educating the patient/family/caregiver, ordering medications, tests, or procedures, documenting clinical information in the electronic health record, and independently interpreting results (not separately reported/billed) and communicating results to the patient/family/caregiver  [name], MD [facility]   End of visit medications:  No orders of the defined types were placed in this encounter.    Current Outpatient Medications:  allopurinol (ZYLOPRIM) 300 MG tablet, TAKE 1 TABLET BY MOUTH DAILY, Disp: 90 tablet, Rfl: 3    Azelastine HCl 137 MCG/SPRAY SOLN, SPRAY ONE SPRAY IN EACH NOSTRIL TWICE DAILY, Disp: 30 mL, Rfl: 3   benazepril (LOTENSIN) 40 MG tablet, Take 1 tablet (40 mg total) by mouth daily., Disp: 90 tablet, Rfl: 3   Blood Glucose Calibration (OT ULTRA/FASTTK CNTRL SOLN) SOLN, Use as instructed to check blood sugars twice a day., Disp: 1 each, Rfl: 5   Blood Glucose Monitoring Suppl (ONE TOUCH ULTRA 2) w/Device KIT, Use as instructed to check blood sugars twice a day., Disp: 1 kit, Rfl: 0   gabapentin (NEURONTIN) 100 MG capsule, TAKE ONE CAPSULE BY MOUTH THREE TIMES A DAY, Disp: 270 capsule, Rfl: 3   ketoconazole (NIZORAL) 2 % shampoo, Apply 1 application topically 2 (two) times a week. As needed for scalp dermatitis, Disp: 120 mL, Rfl: 0   Lancets Misc. (ONE TOUCH SURESOFT) MISC, Use as instructed to check blood sugars twice a day., Disp: 100 each, Rfl: 12   Magnesium Gluconate 500 (27 MG) MG TABS, Take 1 tablet by mouth every evening. , Disp: , Rfl:    melatonin 5 MG TABS, Take 1 tablet by mouth at bedtime. , Disp: , Rfl:    MULTIPLE VITAMIN PO, Take by mouth., Disp: , Rfl:    ONETOUCH ULTRA test strip, Use as instructed to check blood sugars twice a day., Disp: 100 each, Rfl: 12   OZEMPIC, 1 MG/DOSE, 4 MG/3ML SOPN, Inject 1 mg into the skin once a week., Disp: 3 mL, Rfl: 0   rosuvastatin (CRESTOR) 5 MG tablet, Take 1 tablet (5 mg total) by mouth at bedtime., Disp: 90 tablet, Rfl: 3   saw palmetto 160 MG capsule, Take 1 capsule (160 mg total) by mouth 2 (two) times daily., Disp: , Rfl:    sertraline (ZOLOFT) 50 MG tablet, TAKE 1 TABLET BY MOUTH DAILY, Disp: 90 tablet, Rfl: 1   sildenafil (REVATIO) 20 MG tablet, TAKE 3 TO 5 TABLETS BY MOUTH DAILY AS NEEDED, Disp: 90 tablet, Rfl: 1   tamsulosin (FLOMAX) 0.4 MG CAPS capsule, Take 2 capsules (0.8 mg total) by mouth daily., Disp: 180 capsule, Rfl: 3   traZODone (DESYREL) 50 MG tablet, Take 1.5 tablets (75 mg total) by mouth at bedtime., Disp: 135 tablet, Rfl:  3   vitamin B-12 (CYANOCOBALAMIN) 1000 MCG tablet, Take by mouth., Disp: , Rfl:    zinc gluconate 50 MG tablet, Take 50 mg by mouth daily., Disp: , Rfl:    Subjective:   PATIENT ID: Richard Bellows Gammon Sr. GENDER: male DOB: Aug 16, 1955, MRN: [id number]  Chief Complaint  Patient presents with   Consult    HPI  Patient is a pleasant 68 year old male with a past medical history of hypertension, diabetes, previous primary spontaneous pneumothorax at the age of 54, and alcohol use who presents to clinic for the evaluation of a pulmonary nodule.  Patient was in his usual state of health and underwent a coronary CT scan that was noted for a 9 mm left lower lobe pulmonary nodule. He is referred to pulmonary for evaluation of said nodule.  Patient endorses symptoms of shortness of breath with exertion as well as cough productive of white/yellowish sputum which has been ongoing for many years now.  He denies any chest tightness, wheezing, chest pain, fevers, chills, night sweats, or recurrent infections.  He has not had any pneumonia recently.  He reports a history of smoking, having  started as a teenager and quit around 15 years ago.  He probably has around 40 pack years of smoking history.  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  He is from [place] and has lived here all his life.  No other occupational exposures reported.  Family history is notable for a sarcoma in his mother.  No personal history of lung cancer.  Ancillary information including prior medications, full medical/surgical/family/social histories, and PFTs (when available) are listed below and have been reviewed.   Review of Systems  Constitutional:  Negative for chills, fever and weight loss.  Respiratory:  Positive for cough, sputum production and shortness of breath. Negative for hemoptysis and wheezing.   Cardiovascular:  Negative for chest pain and  palpitations.     Objective:   Vitals:   08/22/23 0826  BP: 136/70  Pulse: 68  Resp: 16  Temp: (!) 97.5 F (36.4 C)  TempSrc: Temporal  SpO2: 99%  Weight: 188 lb (85.3 kg)  Height: 5\' 11"  (1.803 m)   99% on RA BMI Readings from Last 3 Encounters:  08/22/23 26.22 kg/m  07/17/23 26.22 kg/m  06/14/23 25.38 kg/m   Wt Readings from Last 3 Encounters:  08/22/23 188 lb (85.3 kg)  07/17/23 188 lb (85.3 kg)  06/14/23 182 lb (82.6 kg)    Physical Exam Constitutional:      Appearance: Normal appearance. He is not ill-appearing.  Cardiovascular:     Rate and Rhythm: Normal rate and regular rhythm.     Pulses: Normal pulses.     Heart sounds: Normal heart sounds.  Pulmonary:     Effort: Pulmonary effort is normal.     Breath sounds: Normal breath sounds. No wheezing or rales.  Abdominal:     Palpations: Abdomen is soft.  Musculoskeletal:     Right lower leg: No edema.     Left lower leg: No edema.  Neurological:     General: No focal deficit present.     Mental Status: He is alert and oriented to person, place, and time. Mental status is at baseline.       Ancillary Information    Past Medical History:  Diagnosis Date   Anxiety    Cholelithiasis    DM (diabetes mellitus), type 2 with neurological complications (HCC)    GERD (gastroesophageal reflux disease)    Hyperlipidemia    Pancytopenia (HCC)    Seasonal allergic rhinitis due to other allergic trigger      Family History  Problem Relation Age of Onset   Diabetes Father        complications of DM caused death   Heart attack Father    Diabetes Sister    Cancer Mother        lung   Lung cancer Mother      Past Surgical History:  Procedure Laterality Date   Lung Collapse  68 years old.     Social History   Socioeconomic History   Marital status: Married    Spouse name: Brodey Bonn   Number of children: 2   Years of education: Not on file   Highest education level: Some college, no  degree  Occupational History   Occupation: retired  Tobacco Use   Smoking status: Former    Current packs/day: 0.00    Types: Cigarettes    Start date: 08/07/1989    Quit date:  08/08/1999    Years since quitting: 24.0   Smokeless tobacco: Former  Building services engineer status: Never Used  Substance and Sexual Activity   Alcohol use: Yes    Alcohol/week: 6.0 standard drinks of alcohol    Types: 6 Cans of beer per week   Drug use: No   Sexual activity: Yes  Other Topics Concern   Not on file  Social History Narrative   Not on file   Social Drivers of Health   Financial Resource Strain: Low Risk  (03/08/2023)   Overall Financial Resource Strain (CARDIA)    Difficulty of Paying Living Expenses: Not hard at all  Food Insecurity: No Food Insecurity (03/08/2023)   Hunger Vital Sign    Worried About Running Out of Food in the Last Year: Never true    Ran Out of Food in the Last Year: Never true  Transportation Needs: No Transportation Needs (03/08/2023)   PRAPARE - Administrator, Civil Service (Medical): No    Lack of Transportation (Non-Medical): No  Physical Activity: Sufficiently Active (03/08/2023)   Exercise Vital Sign    Days of Exercise per Week: 3 days    Minutes of Exercise per Session: 60 min  Stress: No Stress Concern Present (03/08/2023)   [employer] of Occupational Health - Occupational Stress Questionnaire    Feeling of Stress : Not at all  Social Connections: Moderately Isolated (03/08/2023)   Social Connection and Isolation Panel [NHANES]    Frequency of Communication with Friends and Family: Three times a week    Frequency of Social Gatherings with Friends and Family: Once a week    Attends Religious Services: Never    Database administrator or Organizations: No    Attends Banker Meetings: Never    Marital Status: Married  Catering manager Violence: Not At Risk (03/08/2023)   Humiliation, Afraid, Rape, and Kick questionnaire    Fear  of Current or Ex-Partner: No    Emotionally Abused: No    Physically Abused: No    Sexually Abused: No     Allergies  Allergen Reactions   Bee Venom Swelling    Itching      CBC    Component Value Date/Time   WBC 3.9 07/11/2023 0836   RBC 4.05 (L) 07/11/2023 0836   HGB 13.8 07/11/2023 0836   HCT 40.6 07/11/2023 0836   PLT 157 07/11/2023 0836   MCV 100.2 (H) 07/11/2023 0836   MCH 34.1 (H) 07/11/2023 0836   MCHC 34.0 07/11/2023 0836   RDW 12.9 07/11/2023 0836   LYMPHSABS 810 (L) 06/19/2022 0816   MONOABS 0.4 10/09/2021 1009   EOSABS 218 07/11/2023 0836   BASOSABS 39 07/11/2023 0836    Pulmonary Functions Testing Results:     No data to display          Outpatient Medications Prior to Visit  Medication Sig Dispense Refill   allopurinol (ZYLOPRIM) 300 MG tablet TAKE 1 TABLET BY MOUTH DAILY 90 tablet 3   Azelastine HCl 137 MCG/SPRAY SOLN SPRAY ONE SPRAY IN EACH NOSTRIL TWICE DAILY 30 mL 3   benazepril (LOTENSIN) 40 MG tablet Take 1 tablet (40 mg total) by mouth daily. 90 tablet 3   Blood Glucose Calibration (OT ULTRA/FASTTK CNTRL SOLN) SOLN Use as instructed to check blood sugars twice a day. 1 each 5   Blood Glucose Monitoring Suppl (ONE TOUCH ULTRA 2) w/Device KIT Use as instructed to check blood sugars twice a  day. 1 kit 0   gabapentin (NEURONTIN) 100 MG capsule TAKE ONE CAPSULE BY MOUTH THREE TIMES A DAY 270 capsule 3   ketoconazole (NIZORAL) 2 % shampoo Apply 1 application topically 2 (two) times a week. As needed for scalp dermatitis 120 mL 0   Lancets Misc. (ONE TOUCH SURESOFT) MISC Use as instructed to check blood sugars twice a day. 100 each 12   Magnesium Gluconate 500 (27 MG) MG TABS Take 1 tablet by mouth every evening.      melatonin 5 MG TABS Take 1 tablet by mouth at bedtime.      MULTIPLE VITAMIN PO Take by mouth.     ONETOUCH ULTRA test strip Use as instructed to check blood sugars twice a day. 100 each 12   OZEMPIC, 1 MG/DOSE, 4 MG/3ML SOPN Inject 1 mg  into the skin once a week. 3 mL 0   rosuvastatin (CRESTOR) 5 MG tablet Take 1 tablet (5 mg total) by mouth at bedtime. 90 tablet 3   saw palmetto 160 MG capsule Take 1 capsule (160 mg total) by mouth 2 (two) times daily.     sertraline (ZOLOFT) 50 MG tablet TAKE 1 TABLET BY MOUTH DAILY 90 tablet 1   sildenafil (REVATIO) 20 MG tablet TAKE 3 TO 5 TABLETS BY MOUTH DAILY AS NEEDED 90 tablet 1   tamsulosin (FLOMAX) 0.4 MG CAPS capsule Take 2 capsules (0.8 mg total) by mouth daily. 180 capsule 3   traZODone (DESYREL) 50 MG tablet Take 1.5 tablets (75 mg total) by mouth at bedtime. 135 tablet 3   vitamin B-12 (CYANOCOBALAMIN) 1000 MCG tablet Take by mouth.     zinc gluconate 50 MG tablet Take 50 mg by mouth daily.     No facility-administered medications prior to visit.

## 2023-08-30 ENCOUNTER — Ambulatory Visit
Admission: RE | Admit: 2023-08-30 | Discharge: 2023-08-30 | Disposition: A | Discharge status: Home / Self Care | Payer: Medicare HMO | Source: Ambulatory Visit | Attending: Student in an Organized Health Care Education/Training Program | Admitting: Student in an Organized Health Care Education/Training Program

## 2023-08-30 DIAGNOSIS — R911 Solitary pulmonary nodule: Secondary | ICD-10-CM | POA: Insufficient documentation

## 2023-08-30 LAB — GLUCOSE, CAPILLARY: Glucose-Capillary: 124 mg/dL — ABNORMAL HIGH (ref 70–99)

## 2023-08-30 MED ORDER — FLUDEOXYGLUCOSE F - 18 (FDG) INJECTION
10.0800 | Freq: Once | INTRAVENOUS | Status: AC | PRN
  Administered 2023-08-30: 10.08 via INTRAVENOUS

## 2023-09-04 ENCOUNTER — Encounter: Payer: Self-pay | Admitting: Student in an Organized Health Care Education/Training Program

## 2023-09-04 ENCOUNTER — Ambulatory Visit: Payer: Medicare HMO | Admitting: Student in an Organized Health Care Education/Training Program

## 2023-09-04 VITALS — BP 118/66 | HR 69 | Temp 97.8°F | Ht 71.0 in | Wt 186.6 lb

## 2023-09-04 DIAGNOSIS — R911 Solitary pulmonary nodule: Secondary | ICD-10-CM

## 2023-09-04 DIAGNOSIS — R0602 Shortness of breath: Secondary | ICD-10-CM

## 2023-09-04 NOTE — Progress Notes (Signed)
 Assessment & Plan:   1. Lung nodule (Primary)  Nodule Location: LLL Nodule Size: 9 mm Nodule Spiculation: No Associated Lymphadenopathy: No Smoking Status (former) and pack years 40 Extrathoracic cancer > 5 years prior (no) SPN malignancy risk score [facility]): 9.7 %risk of malignancy without PET, 1.2% with PET showing no FDG avidity. ECOG: 0   The patient is here to discuss their imaging abnormalities which include a nodule in the left lower lobe on this partial CT scan of the coronaries.  Discussed with the patient that the size and appearance of the nodule with his smoking history does increase his risk for malignancy.  Discussed that risk of malignancy is around 9.7% based on the [facility] model (sans PET/CT). He's since had his PET/CT, and while there is no official read, I have reviewed the imaging and note that the nodule is not FDG avid on both the attenuation corrected and non-attenuation corrected images. This does decrease the risk of the nodule being malignant to 1.2%. We discussed options that include robotic assisted navigational bronchoscopy to attempt to biopsy this nodule, noting the very distal location of the nodule and high likelihood for atelectasis during any procedure and potential for negative sampling error. Also explained option of short term interval imaging with repeat chest CT in 3 months. Through shared decision making, patient expressed preference for interval radiographic monitoring of the pulmonary nodule. Will obtain repeat chest CT in 3 months and follow up in clinic after.  - CT CHEST WO CONTRAST; Future  2. Shortness of breath  He did express symptoms of shortness of breath and productive cough during his previous visit. In the setting of chronicity and smoking history, he is at risk for COPD. PFT's ordered and pending, will review on return visit.  -PFT's   Return in about 3 months (around 12/08/2023).  I spent 30 minutes caring for  this patient today, including preparing to see the patient, obtaining a medical history , reviewing a separately obtained history, performing a medically appropriate examination and/or evaluation, counseling and educating the patient/family/caregiver, ordering medications, tests, or procedures, documenting clinical information in the electronic health record, and independently interpreting results (not separately reported/billed) and communicating results to the patient/family/caregiver  [name], MD [facility]   End of visit medications:  No orders of the defined types were placed in this encounter.    Current Outpatient Medications:    allopurinol (ZYLOPRIM) 300 MG tablet, TAKE 1 TABLET BY MOUTH DAILY, Disp: 90 tablet, Rfl: 3   Azelastine HCl 137 MCG/SPRAY SOLN, SPRAY ONE SPRAY IN EACH NOSTRIL TWICE DAILY, Disp: 30 mL, Rfl: 3   benazepril (LOTENSIN) 40 MG tablet, Take 1 tablet (40 mg total) by mouth daily., Disp: 90 tablet, Rfl: 3   Blood Glucose Calibration (OT ULTRA/FASTTK CNTRL SOLN) SOLN, Use as instructed to check blood sugars twice a day., Disp: 1 each, Rfl: 5   Blood Glucose Monitoring Suppl (ONE TOUCH ULTRA 2) w/Device KIT, Use as instructed to check blood sugars twice a day., Disp: 1 kit, Rfl: 0   gabapentin (NEURONTIN) 100 MG capsule, TAKE ONE CAPSULE BY MOUTH THREE TIMES A DAY, Disp: 270 capsule, Rfl: 3   ketoconazole (NIZORAL) 2 % shampoo, Apply 1 application topically 2 (two) times a week. As needed for scalp dermatitis, Disp: 120 mL, Rfl: 0   Lancets Misc. (ONE TOUCH SURESOFT) MISC, Use as instructed to check blood sugars twice a day., Disp: 100 each, Rfl: 12  Magnesium Gluconate 500 (27 MG) MG TABS, Take 1 tablet by mouth every evening. , Disp: , Rfl:    melatonin 5 MG TABS, Take 1 tablet by mouth at bedtime. , Disp: , Rfl:    MULTIPLE VITAMIN PO, Take by mouth., Disp: , Rfl:    ONETOUCH ULTRA test strip, Use as instructed to check blood sugars  twice a day., Disp: 100 each, Rfl: 12   OZEMPIC, 1 MG/DOSE, 4 MG/3ML SOPN, Inject 1 mg into the skin once a week., Disp: 3 mL, Rfl: 0   rosuvastatin (CRESTOR) 5 MG tablet, Take 1 tablet (5 mg total) by mouth at bedtime., Disp: 90 tablet, Rfl: 3   saw palmetto 160 MG capsule, Take 1 capsule (160 mg total) by mouth 2 (two) times daily., Disp: , Rfl:    sertraline (ZOLOFT) 50 MG tablet, TAKE 1 TABLET BY MOUTH DAILY, Disp: 90 tablet, Rfl: 1   sildenafil (REVATIO) 20 MG tablet, TAKE 3 TO 5 TABLETS BY MOUTH DAILY AS NEEDED, Disp: 90 tablet, Rfl: 1   tamsulosin (FLOMAX) 0.4 MG CAPS capsule, Take 2 capsules (0.8 mg total) by mouth daily., Disp: 180 capsule, Rfl: 3   traZODone (DESYREL) 50 MG tablet, Take 1.5 tablets (75 mg total) by mouth at bedtime., Disp: 135 tablet, Rfl: 3   vitamin B-12 (CYANOCOBALAMIN) 1000 MCG tablet, Take by mouth., Disp: , Rfl:    zinc gluconate 50 MG tablet, Take 50 mg by mouth daily., Disp: , Rfl:    Subjective:   PATIENT ID: Richard Bellows Hairfield Sr. GENDER: male DOB: 1956/05/02, MRN: [id number]  Chief Complaint  Patient presents with   Follow-up    HPI  Patient is a pleasant 68 year old male with a past medical history of hypertension, diabetes, previous primary spontaneous pneumothorax at the age of 57, and alcohol use who presents to clinic for follow up of pulmonary nodule.  Interval history is unremarkable, and symptoms are unchanged. He has had his PET/CT that is still not officially read but that I was able to review. He remains in his usual state of health with minimal respiratory symptoms. Patient was in his usual state of health and underwent a coronary CT scan that was noted for a 9 mm left lower lobe pulmonary nodule. Patient endorses symptoms of shortness of breath with exertion as well as cough productive of white/yellowish sputum which has been ongoing for many years now.  He denies any chest tightness, wheezing, chest pain, fevers, chills, night sweats, or  recurrent infections.  He has not had any pneumonia recently.   He reports a history of smoking, having started as a teenager and quit around 15 years ago.  He probably has around 40 pack years of smoking history.  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  He is from [place] and has lived here all his life.  No other occupational exposures reported.  Family history is notable for a sarcoma in his mother.  No personal history of lung cancer.  Ancillary information including prior medications, full medical/surgical/family/social histories, and PFTs (when available) are listed below and have been reviewed.   Review of Systems  Constitutional:  Negative for chills, fever and weight loss.  Respiratory:  Positive for cough, sputum production and shortness of breath. Negative for hemoptysis and wheezing.   Cardiovascular:  Negative for chest pain and palpitations.     Objective:  Vitals:   09/04/23 0839  BP: 118/66  Pulse: 69  Temp: 97.8 F (36.6 C)  TempSrc: Temporal  SpO2: 98%  Weight: 186 lb 9.6 oz (84.6 kg)  Height: 5\' 11"  (1.803 m)   98% on RA BMI Readings from Last 3 Encounters:  09/04/23 26.03 kg/m  08/22/23 26.22 kg/m  07/17/23 26.22 kg/m   Wt Readings from Last 3 Encounters:  09/04/23 186 lb 9.6 oz (84.6 kg)  08/22/23 188 lb (85.3 kg)  07/17/23 188 lb (85.3 kg)    Physical Exam Constitutional:      Appearance: Normal appearance. He is not ill-appearing.  Cardiovascular:     Rate and Rhythm: Normal rate and regular rhythm.     Pulses: Normal pulses.     Heart sounds: Normal heart sounds.  Pulmonary:     Effort: Pulmonary effort is normal.     Breath sounds: Normal breath sounds. No wheezing or rales.  Neurological:     General: No focal deficit present.     Mental Status: He is alert and oriented to person, place, and time. Mental status is at baseline.       Ancillary Information     Past Medical History:  Diagnosis Date   Anxiety    Cholelithiasis    DM (diabetes mellitus), type 2 with neurological complications (HCC)    GERD (gastroesophageal reflux disease)    Hyperlipidemia    Pancytopenia (HCC)    Seasonal allergic rhinitis due to other allergic trigger      Family History  Problem Relation Age of Onset   Diabetes Father        complications of DM caused death   Heart attack Father    Diabetes Sister    Cancer Mother        lung   Lung cancer Mother      Past Surgical History:  Procedure Laterality Date   Lung Collapse  68 years old.     Social History   Socioeconomic History   Marital status: Married    Spouse name: Jamerius Boeckman   Number of children: 2   Years of education: Not on file   Highest education level: Some college, no degree  Occupational History   Occupation: retired  Tobacco Use   Smoking status: Former    Current packs/day: 0.00    Types: Cigarettes    Start date: 08/07/1989    Quit date: 08/08/1999    Years since quitting: 24.0   Smokeless tobacco: Former  Building services engineer status: Never Used  Substance and Sexual Activity   Alcohol use: Yes    Alcohol/week: 6.0 standard drinks of alcohol    Types: 6 Cans of beer per week   Drug use: No   Sexual activity: Yes  Other Topics Concern   Not on file  Social History Narrative   Not on file   Social Drivers of Health   Financial Resource Strain: Low Risk  (03/08/2023)   Overall Financial Resource Strain (CARDIA)    Difficulty of Paying Living Expenses: Not hard at all  Food Insecurity: No Food Insecurity (03/08/2023)   Hunger Vital Sign    Worried About Running Out of Food in the Last Year: Never true    Ran Out of Food in the Last Year: Never true  Transportation Needs: No Transportation Needs (03/08/2023)   PRAPARE - Administrator, Civil Service (Medical): No    Lack of Transportation (Non-Medical): No  Physical Activity: Sufficiently  Active  (03/08/2023)   Exercise Vital Sign    Days of Exercise per Week: 3 days    Minutes of Exercise per Session: 60 min  Stress: No Stress Concern Present (03/08/2023)   [employer] of Occupational Health - Occupational Stress Questionnaire    Feeling of Stress : Not at all  Social Connections: Moderately Isolated (03/08/2023)   Social Connection and Isolation Panel [NHANES]    Frequency of Communication with Friends and Family: Three times a week    Frequency of Social Gatherings with Friends and Family: Once a week    Attends Religious Services: Never    Database administrator or Organizations: No    Attends Banker Meetings: Never    Marital Status: Married  Catering manager Violence: Not At Risk (03/08/2023)   Humiliation, Afraid, Rape, and Kick questionnaire    Fear of Current or Ex-Partner: No    Emotionally Abused: No    Physically Abused: No    Sexually Abused: No     Allergies  Allergen Reactions   Bee Venom Swelling    Itching      CBC    Component Value Date/Time   WBC 3.9 07/11/2023 0836   RBC 4.05 (L) 07/11/2023 0836   HGB 13.8 07/11/2023 0836   HCT 40.6 07/11/2023 0836   PLT 157 07/11/2023 0836   MCV 100.2 (H) 07/11/2023 0836   MCH 34.1 (H) 07/11/2023 0836   MCHC 34.0 07/11/2023 0836   RDW 12.9 07/11/2023 0836   LYMPHSABS 810 (L) 06/19/2022 0816   MONOABS 0.4 10/09/2021 1009   EOSABS 218 07/11/2023 0836   BASOSABS 39 07/11/2023 0836    Pulmonary Functions Testing Results:     No data to display          Outpatient Medications Prior to Visit  Medication Sig Dispense Refill   allopurinol (ZYLOPRIM) 300 MG tablet TAKE 1 TABLET BY MOUTH DAILY 90 tablet 3   Azelastine HCl 137 MCG/SPRAY SOLN SPRAY ONE SPRAY IN EACH NOSTRIL TWICE DAILY 30 mL 3   benazepril (LOTENSIN) 40 MG tablet Take 1 tablet (40 mg total) by mouth daily. 90 tablet 3   Blood Glucose Calibration (OT ULTRA/FASTTK CNTRL SOLN) SOLN Use as instructed to check blood sugars twice  a day. 1 each 5   Blood Glucose Monitoring Suppl (ONE TOUCH ULTRA 2) w/Device KIT Use as instructed to check blood sugars twice a day. 1 kit 0   gabapentin (NEURONTIN) 100 MG capsule TAKE ONE CAPSULE BY MOUTH THREE TIMES A DAY 270 capsule 3   ketoconazole (NIZORAL) 2 % shampoo Apply 1 application topically 2 (two) times a week. As needed for scalp dermatitis 120 mL 0   Lancets Misc. (ONE TOUCH SURESOFT) MISC Use as instructed to check blood sugars twice a day. 100 each 12   Magnesium Gluconate 500 (27 MG) MG TABS Take 1 tablet by mouth every evening.      melatonin 5 MG TABS Take 1 tablet by mouth at bedtime.      MULTIPLE VITAMIN PO Take by mouth.     ONETOUCH ULTRA test strip Use as instructed to check blood sugars twice a day. 100 each 12   OZEMPIC, 1 MG/DOSE, 4 MG/3ML SOPN Inject 1 mg into the skin once a week. 3 mL 0   rosuvastatin (CRESTOR) 5 MG tablet Take 1 tablet (5 mg total) by mouth at bedtime. 90 tablet 3   saw palmetto 160 MG capsule Take 1 capsule (160 mg  total) by mouth 2 (two) times daily.     sertraline (ZOLOFT) 50 MG tablet TAKE 1 TABLET BY MOUTH DAILY 90 tablet 1   sildenafil (REVATIO) 20 MG tablet TAKE 3 TO 5 TABLETS BY MOUTH DAILY AS NEEDED 90 tablet 1   tamsulosin (FLOMAX) 0.4 MG CAPS capsule Take 2 capsules (0.8 mg total) by mouth daily. 180 capsule 3   traZODone (DESYREL) 50 MG tablet Take 1.5 tablets (75 mg total) by mouth at bedtime. 135 tablet 3   vitamin B-12 (CYANOCOBALAMIN) 1000 MCG tablet Take by mouth.     zinc gluconate 50 MG tablet Take 50 mg by mouth daily.     No facility-administered medications prior to visit.

## 2023-09-05 ENCOUNTER — Other Ambulatory Visit: Payer: Self-pay | Admitting: Family Medicine

## 2023-09-05 DIAGNOSIS — E1169 Type 2 diabetes mellitus with other specified complication: Secondary | ICD-10-CM

## 2023-09-06 ENCOUNTER — Telehealth: Payer: Self-pay | Admitting: *Deleted

## 2023-09-06 NOTE — Telephone Encounter (Signed)
 LMOVM to verify card hx.

## 2023-09-06 NOTE — Telephone Encounter (Signed)
 Requested Prescriptions  Pending Prescriptions Disp Refills   rosuvastatin (CRESTOR) 5 MG tablet [Pharmacy Med Name: ROSUVASTATIN CALCIUM 5 MG TAB] 90 tablet 3    Sig: TAKE 1 TABLET BY MOUTH AT BEDTIME     Cardiovascular:  Antilipid - Statins 2 Failed - 09/06/2023  4:24 PM      Failed - Lipid Panel in normal range within the last 12 months    Cholesterol, Total  Date Value Ref Range Status  01/20/2015 226 (H) 100 - 199 mg/dL Final   Cholesterol  Date Value Ref Range Status  07/11/2023 152 <200 mg/dL Final   LDL Cholesterol (Calc)  Date Value Ref Range Status  07/11/2023 81 mg/dL (calc) Final    Comment:    Reference range: <100 . Desirable range <100 mg/dL for primary prevention;   <70 mg/dL for patients with CHD or diabetic patients  with > or = 2 CHD risk factors. Marland Kitchen LDL-C is now calculated using the Martin-Hopkins  calculation, which is a validated novel method providing  better accuracy than the Friedewald equation in the  estimation of LDL-C.  Horald Pollen et al. Lenox Ahr. 4401;027(25): 2061-2068  (http://education.[facility].com/faq/FAQ164)    HDL  Date Value Ref Range Status  07/11/2023 51 > OR = 40 mg/dL Final  36/64/4034 44 >74 mg/dL Final    Comment:    According to ATP-III Guidelines, HDL-C >59 mg/dL is considered a negative risk factor for CHD.    Triglycerides  Date Value Ref Range Status  07/11/2023 103 <150 mg/dL Final         Passed - Cr in normal range and within 360 days    Creat  Date Value Ref Range Status  07/11/2023 1.22 0.70 - 1.35 mg/dL Final   Creatinine, Urine  Date Value Ref Range Status  07/17/2023 145 20 - 320 mg/dL Final         Passed - Patient is not pregnant      Passed - Valid encounter within last 12 months    Recent Outpatient Visits           1 month ago Annual physical exam   [place] [facility] Seymour, Netta Neat, DO   3 months ago DM (diabetes mellitus), type 2 with neurological  complications [facility])   [place] [facility] Delles, Gentry Fitz A, RPH-CPP   7 months ago DM (diabetes mellitus), type 2 with neurological complications [facility])   [place] [facility] Kidder, Netta Neat, DO   1 year ago DM (diabetes mellitus), type 2 with neurological complications [facility])   [place] [facility] Delles, Gentry Fitz A, RPH-CPP   1 year ago DM (diabetes mellitus), type 2 with neurological complications [facility])   [place] [facility] Delles, Jackelyn Poling, RPH-CPP       Future Appointments             In 4 days Gollan, Tollie Pizza, MD [facility] [place]   In 3 months [name], MD [facility] [place]   In 4 months Althea Charon, Netta Neat, DO [place] [facility]   In 9 months [name], Verna Czech, MD [facility] [place]

## 2023-09-09 ENCOUNTER — Encounter: Payer: Self-pay | Admitting: Cardiovascular Disease

## 2023-09-09 NOTE — Progress Notes (Unsigned)
 Cardiology Office Note  Date:  09/10/2023   ID:  Richard Modena Sr., DOB 05/09/1956, MRN [id number]  PCP:  Smitty Cords, DO   Chief Complaint  Patient presents with   New Patient (Initial Visit)    Ref by Dr. Althea Charon for elevated coronary artery calcium score. Patient c/o shortness of breath with activity.      HPI:  Richard Day is a 68 year old gentleman with past medical history of Diabetes type 2 Hyperlipidemia Essential hypertension Quit smoking, smoked teenager to quit 52, 34 yrs Who presents by referral from Dr. Althea Charon for coronary calcification  Preventive screening study performed through primary care CT calcium score July 19, 2023, images pulled up and reviewed Calcium score 1332 Heavy calcification in the LAD and left circumflex 9 mm solid pulmonary nodule in the left lower lobe   Reports having some mild shortness of breath on exertion Typically after exerting himself for 15 min, has to sit, secondary to SOB Has been having sx for several years Same level, does not seem to be escalating  No regular exercise program  Labs reviewed A1C 6.0 Total cholesterol 152 LDL 81  EKG personally reviewed by myself on todays visit EKG Interpretation Date/Time:  Tuesday September 10 2023 10:20:35 EST Ventricular Rate:  67 PR Interval:  130 QRS Duration:  90 QT Interval:  432 QTC Calculation: 456 R Axis:   7  Text Interpretation: Normal sinus rhythm Low voltage QRS When compared with ECG of 29-Jul-2009 20:30, Vent. rate has decreased BY  43 BPM Confirmed by Julien Nordmann [phone]) on 09/10/2023 1:29:35 PM    PMH:   has a past medical history of Anxiety, Cholelithiasis, DM (diabetes mellitus), type 2 with neurological complications (HCC), GERD (gastroesophageal reflux disease), Hyperlipidemia, Pancytopenia (HCC), and Seasonal allergic rhinitis due to other allergic trigger.  PSH:    Past Surgical History:  Procedure Laterality Date   Lung Collapse   68 years old.     Current Outpatient Medications  Medication Sig Dispense Refill   allopurinol (ZYLOPRIM) 300 MG tablet TAKE 1 TABLET BY MOUTH DAILY 90 tablet 3   aspirin EC 81 MG tablet Take 1 tablet (81 mg total) by mouth daily. Swallow whole.     Azelastine HCl 137 MCG/SPRAY SOLN SPRAY ONE SPRAY IN EACH NOSTRIL TWICE DAILY 30 mL 3   benazepril (LOTENSIN) 40 MG tablet Take 1 tablet (40 mg total) by mouth daily. 90 tablet 3   Blood Glucose Calibration (OT ULTRA/FASTTK CNTRL SOLN) SOLN Use as instructed to check blood sugars twice a day. 1 each 5   Blood Glucose Monitoring Suppl (ONE TOUCH ULTRA 2) w/Device KIT Use as instructed to check blood sugars twice a day. 1 kit 0   gabapentin (NEURONTIN) 100 MG capsule TAKE ONE CAPSULE BY MOUTH THREE TIMES A DAY 270 capsule 3   ketoconazole (NIZORAL) 2 % shampoo Apply 1 application topically 2 (two) times a week. As needed for scalp dermatitis 120 mL 0   Lancets Misc. (ONE TOUCH SURESOFT) MISC Use as instructed to check blood sugars twice a day. 100 each 12   Magnesium Gluconate 500 (27 MG) MG TABS Take 1 tablet by mouth every evening.      melatonin 5 MG TABS Take 1 tablet by mouth at bedtime.      MULTIPLE VITAMIN PO Take by mouth.     ONETOUCH ULTRA test strip Use as instructed to check blood sugars twice a day. 100 each 12   OZEMPIC, 1 MG/DOSE,  4 MG/3ML SOPN Inject 1 mg into the skin once a week. 3 mL 0   saw palmetto 160 MG capsule Take 1 capsule (160 mg total) by mouth 2 (two) times daily.     sertraline (ZOLOFT) 50 MG tablet TAKE 1 TABLET BY MOUTH DAILY 90 tablet 1   sildenafil (REVATIO) 20 MG tablet TAKE 3 TO 5 TABLETS BY MOUTH DAILY AS NEEDED 90 tablet 1   tamsulosin (FLOMAX) 0.4 MG CAPS capsule Take 2 capsules (0.8 mg total) by mouth daily. 180 capsule 3   traZODone (DESYREL) 50 MG tablet Take 1.5 tablets (75 mg total) by mouth at bedtime. 135 tablet 3   vitamin B-12 (CYANOCOBALAMIN) 1000 MCG tablet Take by mouth.     zinc gluconate 50 MG  tablet Take 50 mg by mouth daily.     rosuvastatin (CRESTOR) 5 MG tablet Take 1 tablet (5 mg total) by mouth at bedtime. 90 tablet 3   No current facility-administered medications for this visit.     Allergies:   Bee venom   Social History:  The patient  reports that he quit smoking about 24 years ago. His smoking use included cigarettes. He started smoking about 34 years ago. He has quit using smokeless tobacco. He reports current alcohol use of about 6.0 standard drinks of alcohol per week. He reports that he does not use drugs.   Family History:   family history includes Cancer in his mother; Diabetes in his father and sister; Heart attack in his father; Lung cancer in his mother.    Review of Systems: Review of Systems  Constitutional: Negative.   HENT: Negative.    Respiratory:  Positive for shortness of breath.   Cardiovascular: Negative.   Gastrointestinal: Negative.   Musculoskeletal: Negative.   Neurological: Negative.   Psychiatric/Behavioral: Negative.    All other systems reviewed and are negative.    PHYSICAL EXAM: VS:  BP 136/68 (BP Location: Right Arm, Patient Position: Sitting, Cuff Size: Normal)   Pulse 67   Ht 5\' 11"  (1.803 m)   Wt 187 lb 2 oz (84.9 kg)   SpO2 98%   BMI 26.10 kg/m  , BMI Body mass index is 26.1 kg/m. GEN: Well nourished, well developed, in no acute distress HEENT: normal Neck: no JVD, carotid bruits, or masses Cardiac: RRR; no murmurs, rubs, or gallops,no edema  Respiratory:  clear to auscultation bilaterally, normal work of breathing GI: soft, nontender, nondistended, + BS MS: no deformity or atrophy Skin: warm and dry, no rash Neuro:  Strength and sensation are intact Psych: euthymic mood, full affect   Recent Labs: 07/11/2023: ALT 32; BUN 22; Creat 1.22; Hemoglobin 13.8; Platelets 157; Potassium 4.9; Sodium 136; TSH 3.15    Lipid Panel Lab Results  Component Value Date   CHOL 152 07/11/2023   HDL 51 07/11/2023   LDLCALC 81  07/11/2023   TRIG 103 07/11/2023      Wt Readings from Last 3 Encounters:  09/10/23 187 lb 2 oz (84.9 kg)  09/04/23 186 lb 9.6 oz (84.6 kg)  08/22/23 188 lb (85.3 kg)       ASSESSMENT AND PLAN:  Problem List Items Addressed This Visit       Cardiology Problems   Essential hypertension   Relevant Medications   rosuvastatin (CRESTOR) 20 MG tablet   aspirin EC 81 MG tablet   Other Relevant Orders   EKG 12-Lead (Completed)   Hyperlipidemia associated with type 2 diabetes mellitus (HCC)   Relevant Medications  rosuvastatin (CRESTOR) 20 MG tablet   aspirin EC 81 MG tablet     Other   DM (diabetes mellitus), type 2 with neurological complications (HCC)   Relevant Medications   rosuvastatin (CRESTOR) 20 MG tablet   aspirin EC 81 MG tablet   Other Visit Diagnoses       Coronary artery calcification    -  Primary   Relevant Medications   rosuvastatin (CRESTOR) 20 MG tablet   aspirin EC 81 MG tablet   Other Relevant Orders   EKG 12-Lead (Completed)      Coronary calcification Heavy calcium in the LAD and left circumflex Prior smoking history, hyperlipidemia, diabetes Reports he quit smoking, A1c under good control Recommend he increase Crestor up to 20 mg daily to achieve goal LDL less than 55  Shortness of breath/chest tightness Unable to exclude ischemia in light of heavy coronary calcification Lexi scan Myoview ordered to rule out high risk ischemia Continue aggressive medical management with aspirin 81 mg daily (he was taking 325), and increase Crestor up to 20 daily  History of smoking Possibly contributing to shortness of breath as detailed above Smoked for 35 years, quit many years ago Unable to exclude underlying lung disease At his request albuterol inhaler sent in  Essential hypertension Blood pressure is well controlled on today's visit. No changes made to the medications.   Signed, Dossie Arbour, M.D., Ph.D. [facility] [place],  [place] [phone]

## 2023-09-10 ENCOUNTER — Encounter: Payer: Self-pay | Admitting: Cardiovascular Disease

## 2023-09-10 ENCOUNTER — Ambulatory Visit: Payer: Medicare HMO | Attending: Cardiovascular Disease | Admitting: Cardiovascular Disease

## 2023-09-10 VITALS — BP 136/68 | HR 67 | Ht 71.0 in | Wt 187.1 lb

## 2023-09-10 DIAGNOSIS — E1149 Type 2 diabetes mellitus with other diabetic neurological complication: Secondary | ICD-10-CM | POA: Diagnosis not present

## 2023-09-10 DIAGNOSIS — I2510 Atherosclerotic heart disease of native coronary artery without angina pectoris: Secondary | ICD-10-CM | POA: Diagnosis not present

## 2023-09-10 DIAGNOSIS — E785 Hyperlipidemia, unspecified: Secondary | ICD-10-CM | POA: Diagnosis not present

## 2023-09-10 DIAGNOSIS — I10 Essential (primary) hypertension: Secondary | ICD-10-CM

## 2023-09-10 DIAGNOSIS — R079 Chest pain, unspecified: Secondary | ICD-10-CM | POA: Diagnosis not present

## 2023-09-10 DIAGNOSIS — E1169 Type 2 diabetes mellitus with other specified complication: Secondary | ICD-10-CM | POA: Diagnosis not present

## 2023-09-10 MED ORDER — ALBUTEROL SULFATE HFA 108 (90 BASE) MCG/ACT IN AERS: 1.0000 | INHALATION_SPRAY | Freq: Four times a day (QID) | RESPIRATORY_TRACT | 2 refills | Status: AC | PRN

## 2023-09-10 MED ORDER — ASPIRIN 81 MG PO TBEC: 81.0000 mg | DELAYED_RELEASE_TABLET | Freq: Every day | ORAL | Status: AC

## 2023-09-10 MED ORDER — ROSUVASTATIN CALCIUM 20 MG PO TABS
20.0000 mg | ORAL_TABLET | Freq: Every day | ORAL | 3 refills | Status: AC
Start: 2023-09-10 — End: ?

## 2023-09-10 NOTE — Patient Instructions (Addendum)
 Medication Instructions:  Please increase the crestor up to 20 mg daily  Albuterol as needed for shortness of breath  Asa 81 daily  If you need a refill on your cardiac medications before your next appointment, please call your pharmacy.   Lab work: No new labs needed  Testing/Procedures: Your provider has ordered a Lexiscan/ Exercise Myoview Stress test. This will take place at [facility]. Please report to the [facility] medical mall entrance. The volunteers at the first desk will direct you where to go.  [facility] MYOVIEW  Your provider has ordered a Stress Test with nuclear imaging. The purpose of this test is to evaluate the blood supply to your heart muscle. This procedure is referred to as a "Non-Invasive Stress Test." This is because other than having an IV started in your vein, nothing is inserted or "invades" your body. Cardiac stress tests are done to find areas of poor blood flow to the heart by determining the extent of coronary artery disease (CAD). Some patients exercise on a treadmill, which naturally increases the blood flow to your heart, while others who are unable to walk on a treadmill due to physical limitations will have a pharmacologic/chemical stress agent called Lexiscan . This medicine will mimic walking on a treadmill by temporarily increasing your coronary blood flow.   Please note: these test may take anywhere between 2-4 hours to complete  How to prepare for your Myoview test:  Nothing to eat for 6 hours prior to the test No caffeine for 24 hours prior to test No smoking 24 hours prior to test. Your medication may be taken with water.  If your doctor stopped a medication because of this test, do not take that medication. Ladies, please do not wear dresses.  Skirts or pants are appropriate. Please wear a short sleeve shirt. No perfume, cologne or lotion. Wear comfortable walking shoes. No heels!   PLEASE NOTIFY THE OFFICE AT LEAST 24 HOURS IN ADVANCE IF YOU ARE UNABLE TO  KEEP YOUR APPOINTMENT.  [phone] AND  PLEASE NOTIFY NUCLEAR MEDICINE AT [facility] AT LEAST 24 HOURS IN ADVANCE IF YOU ARE UNABLE TO KEEP YOUR APPOINTMENT. [phone]   Follow-Up: At [facility], you and your health needs are our priority.  As part of our continuing mission to provide you with exceptional heart care, we have created designated Provider Care Teams.  These Care Teams include your primary Cardiologist (physician) and Advanced Practice Providers (APPs -  Physician Assistants and Nurse Practitioners) who all work together to provide you with the care you need, when you need it.  You will need a follow up appointment in 12 months  Providers on your designated Care Team:   Nicolasa Ducking, NP Eula Listen, PA-C Cadence Fransico Michael, [place]  COVID-19 Vaccine Information can be found at: [email or web address] For questions related to vaccine distribution or appointments, please email [email or web address] or call [phone].

## 2023-09-12 ENCOUNTER — Encounter
Admission: RE | Admit: 2023-09-12 | Discharge: 2023-09-12 | Disposition: A | Discharge status: Home / Self Care | Source: Ambulatory Visit | Attending: Cardiovascular Disease | Admitting: Cardiovascular Disease

## 2023-09-12 DIAGNOSIS — I2510 Atherosclerotic heart disease of native coronary artery without angina pectoris: Secondary | ICD-10-CM | POA: Insufficient documentation

## 2023-09-12 DIAGNOSIS — R079 Chest pain, unspecified: Secondary | ICD-10-CM | POA: Insufficient documentation

## 2023-09-12 LAB — NM MYOCAR MULTI W/SPECT W/WALL MOTION / EF
LV dias vol: 90 mL (ref 62–150)
LV sys vol: 27 mL
MPHR: 152 {beats}/min
Nuc Stress EF: 70 %
Peak HR: 95 {beats}/min
Percent HR: 62 %
Rest HR: 73 {beats}/min
Rest Nuclear Isotope Dose: 10.7 mCi
SDS: 0
SRS: 6
SSS: 0
ST Depression (mm): 0 mm
Stress Nuclear Isotope Dose: 30 mCi
TID: 1.24

## 2023-09-12 MED ORDER — TECHNETIUM TC 99M TETROFOSMIN IV KIT
29.9600 | PACK | Freq: Once | INTRAVENOUS | Status: AC | PRN
  Administered 2023-09-12: 29.96 via INTRAVENOUS

## 2023-09-12 MED ORDER — TECHNETIUM TC 99M TETROFOSMIN IV KIT
10.6900 | PACK | Freq: Once | INTRAVENOUS | Status: AC | PRN
  Administered 2023-09-12: 10.69 via INTRAVENOUS

## 2023-09-12 MED ORDER — REGADENOSON 0.4 MG/5ML IV SOLN
0.4000 mg | Freq: Once | INTRAVENOUS | Status: AC
Start: 2023-09-12 — End: 2023-09-12
  Administered 2023-09-12: 0.4 mg via INTRAVENOUS

## 2023-09-13 ENCOUNTER — Encounter: Payer: Self-pay | Admitting: Cardiovascular Disease

## 2023-09-24 ENCOUNTER — Encounter: Payer: Self-pay | Admitting: Family Medicine

## 2023-09-24 ENCOUNTER — Encounter: Payer: Self-pay | Admitting: Student in an Organized Health Care Education/Training Program

## 2023-10-23 ENCOUNTER — Other Ambulatory Visit: Payer: Self-pay | Admitting: Family Medicine

## 2023-10-23 DIAGNOSIS — J3089 Other allergic rhinitis: Secondary | ICD-10-CM

## 2023-10-24 ENCOUNTER — Other Ambulatory Visit: Payer: Self-pay

## 2023-10-24 DIAGNOSIS — J3089 Other allergic rhinitis: Secondary | ICD-10-CM

## 2023-10-24 NOTE — Telephone Encounter (Signed)
 LOV 07/17/2023 had CPE  Requested Prescriptions  Pending Prescriptions Disp Refills   Azelastine HCl 137 MCG/SPRAY SOLN [Pharmacy Med Name: AZELASTINE 0.1% (137 MCG) SPRY] 30 mL 3    Sig: SPRAY 1 SPRAY IN EACH NOSTRIL TWICE DAILY     Ear, Nose, and Throat: Nasal Preparations - Antiallergy Failed - 10/24/2023 12:01 PM      Failed - Valid encounter within last 12 months    Recent Outpatient Visits   None     Future Appointments             In 2 months [name], Kayleen Party, DO [place] [facility], [facility]   In 7 months [name], Kizzie Perks, MD [facility] [place]

## 2023-12-06 ENCOUNTER — Other Ambulatory Visit: Payer: Self-pay | Admitting: Family Medicine

## 2023-12-06 DIAGNOSIS — F339 Major depressive disorder, recurrent, unspecified: Secondary | ICD-10-CM

## 2023-12-07 NOTE — Telephone Encounter (Signed)
 Requested Prescriptions  Pending Prescriptions Disp Refills   sertraline  (ZOLOFT ) 50 MG tablet [Pharmacy Med Name: SERTRALINE  HCL 50 MG TABLET] 90 tablet 1    Sig: TAKE 1 TABLET BY MOUTH DAILY     Psychiatry:  Antidepressants - SSRI - sertraline  Failed - 12/07/2023  3:13 PM      Failed - AST in normal range and within 360 days    AST  Date Value Ref Range Status  07/11/2023 40 (H) 10 - 35 U/L Final         Failed - Valid encounter within last 6 months    Recent Outpatient Visits   None     Future Appointments             In 1 month [name], Kayleen Party, DO [place] [facility], [facility]   In 6 months [name], Kizzie Perks, MD [facility] [place]            Passed - ALT in normal range and within 360 days    ALT  Date Value Ref Range Status  07/11/2023 32 9 - 46 U/L Final         Passed - Completed PHQ-2 or PHQ-9 in the last 360 days

## 2023-12-13 ENCOUNTER — Ambulatory Visit
Admission: RE | Admit: 2023-12-13 | Discharge: 2023-12-13 | Disposition: A | Discharge status: Home / Self Care | Source: Ambulatory Visit | Attending: Family Medicine | Admitting: Family Medicine

## 2023-12-13 ENCOUNTER — Ambulatory Visit: Payer: Medicare HMO | Admitting: Student in an Organized Health Care Education/Training Program

## 2023-12-13 ENCOUNTER — Encounter: Payer: Self-pay | Admitting: Student in an Organized Health Care Education/Training Program

## 2023-12-13 VITALS — BP 138/80 | HR 75 | Temp 97.3°F | Ht 71.0 in | Wt 192.2 lb

## 2023-12-13 DIAGNOSIS — I700 Atherosclerosis of aorta: Secondary | ICD-10-CM | POA: Diagnosis not present

## 2023-12-13 DIAGNOSIS — R911 Solitary pulmonary nodule: Secondary | ICD-10-CM | POA: Diagnosis not present

## 2023-12-13 DIAGNOSIS — R0602 Shortness of breath: Secondary | ICD-10-CM | POA: Diagnosis not present

## 2023-12-13 DIAGNOSIS — K7030 Alcoholic cirrhosis of liver without ascites: Secondary | ICD-10-CM

## 2023-12-13 DIAGNOSIS — R161 Splenomegaly, not elsewhere classified: Secondary | ICD-10-CM | POA: Diagnosis not present

## 2023-12-13 NOTE — Progress Notes (Signed)
 Assessment & Plan:   #Lung nodule (Primary)  Nodule Location: LLL Nodule Size: 9 mm Nodule Spiculation: No Associated Lymphadenopathy: No Smoking Status (former) and pack years: 40 Extrathoracic cancer > 5 years prior (no) PET/CT: no FDG avidity SPN malignancy risk score [facility]): 1.2 %r isk of malignancy ECOG: 0  The patient is here to discuss their imaging abnormalities which include a nodule in the right lower lobe that we have been following with serial imaging.  He has now had multiple CT scans of the chest, most recently this morning.  I have personally reviewed this a.m.'s chest CT and compared the nodule to his previous imaging and noted to be stable. We will wait for the official read but at this point, given stability as well as no FDG avidity on his PET/CT from February, this is most likely a benign nodule.  We will continue with close radiographic monitoring, but we will space out the next CT to be performed in 6 months.  Should the repeat be stable, I will switch him to yearly CT scans.  - CT CHEST WO CONTRAST; Future  #Shortness of breath  Does have a history of smoking and is reporting symptoms of exertional dyspnea that have persisted.  I will reorder his PFTs to evaluate spirometry for obstruction given his high risk for COPD with his smoking history.  - Pulmonary Function Test; Future  #Liver Cirrhosis  Patient with significant history of alcohol use and continued use.  Discussed this with him and recommended cessation.  I also offered him a referral to gastroenterology for further workup of his liver cirrhosis and further management.  Patient declined referral at this point and will discuss further with his primary care physician.  Return in about 7 months (around 07/14/2024).  I spent 40 minutes caring for this patient today, including preparing to see the patient, obtaining a medical history , reviewing a separately obtained history, performing a  medically appropriate examination and/or evaluation, counseling and educating the patient/family/caregiver, ordering medications, tests, or procedures, documenting clinical information in the electronic health record, and independently interpreting results (not separately reported/billed) and communicating results to the patient/family/caregiver  Vergia Glasgow, MD [facility]  End of visit medications:  No orders of the defined types were placed in this encounter.    Current Outpatient Medications:    albuterol  (VENTOLIN  HFA) 108 (90 Base) MCG/ACT inhaler, Inhale 1-2 puffs into the lungs every 6 (six) hours as needed for wheezing or shortness of breath., Disp: 8 g, Rfl: 2   allopurinol  (ZYLOPRIM ) 300 MG tablet, TAKE 1 TABLET BY MOUTH DAILY, Disp: 90 tablet, Rfl: 3   aspirin  EC 81 MG tablet, Take 1 tablet (81 mg total) by mouth daily. Swallow whole., Disp: , Rfl:    Azelastine  HCl 137 MCG/SPRAY SOLN, SPRAY 1 SPRAY IN EACH NOSTRIL TWICE DAILY, Disp: 30 mL, Rfl: 3   benazepril  (LOTENSIN ) 40 MG tablet, Take 1 tablet (40 mg total) by mouth daily., Disp: 90 tablet, Rfl: 3   Blood Glucose Calibration (OT ULTRA/FASTTK CNTRL SOLN) SOLN, Use as instructed to check blood sugars twice a day., Disp: 1 each, Rfl: 5   Blood Glucose Monitoring Suppl (ONE TOUCH ULTRA 2) w/Device KIT, Use as instructed to check blood sugars twice a day., Disp: 1 kit, Rfl: 0   gabapentin  (NEURONTIN ) 100 MG capsule, TAKE ONE CAPSULE BY MOUTH THREE TIMES A DAY, Disp: 270 capsule, Rfl: 3   ketoconazole  (NIZORAL ) 2 % shampoo, Apply 1  application topically 2 (two) times a week. As needed for scalp dermatitis, Disp: 120 mL, Rfl: 0   Lancets Misc. (ONE TOUCH SURESOFT) MISC, Use as instructed to check blood sugars twice a day., Disp: 100 each, Rfl: 12   Magnesium Gluconate 500 (27 MG) MG TABS, Take 1 tablet by mouth every evening. , Disp: , Rfl:    melatonin 5 MG TABS, Take 1 tablet by mouth at bedtime. , Disp: ,  Rfl:    MULTIPLE VITAMIN PO, Take by mouth., Disp: , Rfl:    ONETOUCH ULTRA test strip, Use as instructed to check blood sugars twice a day., Disp: 100 each, Rfl: 12   OZEMPIC , 1 MG/DOSE, 4 MG/3ML SOPN, Inject 1 mg into the skin once a week., Disp: 3 mL, Rfl: 0   rosuvastatin  (CRESTOR ) 20 MG tablet, Take 1 tablet (20 mg total) by mouth at bedtime., Disp: 90 tablet, Rfl: 3   saw palmetto  160 MG capsule, Take 1 capsule (160 mg total) by mouth 2 (two) times daily., Disp: , Rfl:    sertraline  (ZOLOFT ) 50 MG tablet, TAKE 1 TABLET BY MOUTH DAILY, Disp: 90 tablet, Rfl: 0   sildenafil  (REVATIO ) 20 MG tablet, TAKE 3 TO 5 TABLETS BY MOUTH DAILY AS NEEDED, Disp: 90 tablet, Rfl: 1   tamsulosin  (FLOMAX ) 0.4 MG CAPS capsule, Take 2 capsules (0.8 mg total) by mouth daily., Disp: 180 capsule, Rfl: 3   traZODone  (DESYREL ) 50 MG tablet, Take 1.5 tablets (75 mg total) by mouth at bedtime., Disp: 135 tablet, Rfl: 3   vitamin B-12 (CYANOCOBALAMIN ) 1000 MCG tablet, Take by mouth., Disp: , Rfl:    zinc gluconate 50 MG tablet, Take 50 mg by mouth daily., Disp: , Rfl:    Subjective:   PATIENT ID: Richard Masker Groner Sr. GENDER: male DOB: Feb 01, 1956, MRN: [id number]  Chief Complaint  Patient presents with   Follow-up    DOE. No wheezing. Cough with clear sputum in the morning.     HPI  Patient is a pleasant 68 year old male with a past medical history of hypertension, diabetes, previous primary spontaneous pneumothorax at the age of 54, and alcohol use presenting for follow-up of a pulmonary nodule.  He is presenting today after having had his repeat chest CT which was performed today.  Patient was in his usual state of health and underwent a coronary CT scan that was noted for a 9 mm left lower lobe pulmonary nodule. Patient endorses symptoms of shortness of breath with exertion as well as cough productive of white/yellowish sputum which has been ongoing for many years now.  He continues to experience exertional  dyspnea especially with significant activity. He denies any chest tightness, wheezing, chest pain, fevers, chills, night sweats, or recurrent infections.   He reports a history of smoking, having started as a teenager and quit around 15 years ago.  He probably has around 40 pack years of smoking history.  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  He is from [place]  and has lived here all his life.  No other occupational exposures reported.  Family history is notable for a sarcoma in his mother.  No personal history of lung cancer.  Patient reports drinking around 6 beers a day.  Ancillary information including prior medications, full medical/surgical/family/social histories, and PFTs (when available) are listed below and have been reviewed.   Review of Systems  Constitutional:  Negative for chills, fever and weight loss.  Respiratory:  Positive for shortness of breath. Negative for cough, hemoptysis, sputum production and wheezing.   Cardiovascular:  Negative for chest pain and palpitations.     Objective:   Vitals:   12/13/23 1011  BP: 138/80  Pulse: 75  Temp: (!) 97.3 F (36.3 C)  SpO2: 96%  Weight: 192 lb 3.2 oz (87.2 kg)  Height: 5\' 11"  (1.803 m)   96% on RA  BMI Readings from Last 3 Encounters:  12/13/23 26.81 kg/m  09/10/23 26.10 kg/m  09/04/23 26.03 kg/m   Wt Readings from Last 3 Encounters:  12/13/23 192 lb 3.2 oz (87.2 kg)  09/10/23 187 lb 2 oz (84.9 kg)  09/04/23 186 lb 9.6 oz (84.6 kg)    Physical Exam Constitutional:      Appearance: Normal appearance. He is not ill-appearing.  Cardiovascular:     Rate and Rhythm: Normal rate and regular rhythm.     Pulses: Normal pulses.     Heart sounds: Normal heart sounds.  Pulmonary:     Effort: Pulmonary effort is normal.     Breath sounds: Normal breath sounds. No wheezing or rales.  Neurological:     General: No focal deficit present.      Mental Status: He is alert and oriented to person, place, and time. Mental status is at baseline.       Ancillary Information    Past Medical History:  Diagnosis Date   Anxiety    Cholelithiasis    DM (diabetes mellitus), type 2 with neurological complications (HCC)    GERD (gastroesophageal reflux disease)    Hyperlipidemia    Pancytopenia (HCC)    Seasonal allergic rhinitis due to other allergic trigger      Family History  Problem Relation Age of Onset   Diabetes Father        complications of DM caused death   Heart attack Father    Diabetes Sister    Cancer Mother        lung   Lung cancer Mother      Past Surgical History:  Procedure Laterality Date   Lung Collapse  68 years old.     Social History   Socioeconomic History   Marital status: Married    Spouse name: Antino Mayabb   Number of children: 2   Years of education: Not on file   Highest education level: Some college, no degree  Occupational History   Occupation: retired  Tobacco Use   Smoking status: Former    Current packs/day: 0.00    Types: Cigarettes    Start date: 08/07/1989    Quit date: 08/08/1999    Years since quitting: 24.3   Smokeless tobacco: Former  Building services engineer status: Never Used  Substance and Sexual Activity   Alcohol use: Yes    Alcohol/week: 6.0 standard drinks of alcohol    Types: 6 Cans of beer per week   Drug use: No   Sexual activity: Yes  Other Topics Concern   Not on file  Social History Narrative   Not on file   Social Drivers of Health   Financial Resource Strain: Low Risk  (03/08/2023)   Overall Financial Resource Strain (CARDIA)    Difficulty of Paying Living Expenses: Not hard at all  Food Insecurity: No Food Insecurity (03/08/2023)   Hunger Vital Sign    Worried About Running Out of Food in the Last Year: Never true  Ran Out of Food in the Last Year: Never true  Transportation Needs: No Transportation Needs (03/08/2023)   PRAPARE -  Administrator, Civil Service (Medical): No    Lack of Transportation (Non-Medical): No  Physical Activity: Sufficiently Active (03/08/2023)   Exercise Vital Sign    Days of Exercise per Week: 3 days    Minutes of Exercise per Session: 60 min  Stress: No Stress Concern Present (03/08/2023)   [employer] of Occupational Health - Occupational Stress Questionnaire    Feeling of Stress : Not at all  Social Connections: Moderately Isolated (03/08/2023)   Social Connection and Isolation Panel [NHANES]    Frequency of Communication with Friends and Family: Three times a week    Frequency of Social Gatherings with Friends and Family: Once a week    Attends Religious Services: Never    Database administrator or Organizations: No    Attends Banker Meetings: Never    Marital Status: Married  Catering manager Violence: Not At Risk (03/08/2023)   Humiliation, Afraid, Rape, and Kick questionnaire    Fear of Current or Ex-Partner: No    Emotionally Abused: No    Physically Abused: No    Sexually Abused: No     Allergies  Allergen Reactions   Bee Venom Swelling    Itching      CBC    Component Value Date/Time   WBC 3.9 07/11/2023 0836   RBC 4.05 (L) 07/11/2023 0836   HGB 13.8 07/11/2023 0836   HCT 40.6 07/11/2023 0836   PLT 157 07/11/2023 0836   MCV 100.2 (H) 07/11/2023 0836   MCH 34.1 (H) 07/11/2023 0836   MCHC 34.0 07/11/2023 0836   RDW 12.9 07/11/2023 0836   LYMPHSABS 810 (L) 06/19/2022 0816   MONOABS 0.4 10/09/2021 1009   EOSABS 218 07/11/2023 0836   BASOSABS 39 07/11/2023 0836    Pulmonary Functions Testing Results:     No data to display          Outpatient Medications Prior to Visit  Medication Sig Dispense Refill   albuterol  (VENTOLIN  HFA) 108 (90 Base) MCG/ACT inhaler Inhale 1-2 puffs into the lungs every 6 (six) hours as needed for wheezing or shortness of breath. 8 g 2   allopurinol  (ZYLOPRIM ) 300 MG tablet TAKE 1 TABLET BY MOUTH  DAILY 90 tablet 3   aspirin  EC 81 MG tablet Take 1 tablet (81 mg total) by mouth daily. Swallow whole.     Azelastine  HCl 137 MCG/SPRAY SOLN SPRAY 1 SPRAY IN EACH NOSTRIL TWICE DAILY 30 mL 3   benazepril  (LOTENSIN ) 40 MG tablet Take 1 tablet (40 mg total) by mouth daily. 90 tablet 3   Blood Glucose Calibration (OT ULTRA/FASTTK CNTRL SOLN) SOLN Use as instructed to check blood sugars twice a day. 1 each 5   Blood Glucose Monitoring Suppl (ONE TOUCH ULTRA 2) w/Device KIT Use as instructed to check blood sugars twice a day. 1 kit 0   gabapentin  (NEURONTIN ) 100 MG capsule TAKE ONE CAPSULE BY MOUTH THREE TIMES A DAY 270 capsule 3   ketoconazole  (NIZORAL ) 2 % shampoo Apply 1 application topically 2 (two) times a week. As needed for scalp dermatitis 120 mL 0   Lancets Misc. (ONE TOUCH SURESOFT) MISC Use as instructed to check blood sugars twice a day. 100 each 12   Magnesium Gluconate 500 (27 MG) MG TABS Take 1 tablet by mouth every evening.      melatonin 5  MG TABS Take 1 tablet by mouth at bedtime.      MULTIPLE VITAMIN PO Take by mouth.     ONETOUCH ULTRA test strip Use as instructed to check blood sugars twice a day. 100 each 12   OZEMPIC , 1 MG/DOSE, 4 MG/3ML SOPN Inject 1 mg into the skin once a week. 3 mL 0   rosuvastatin  (CRESTOR ) 20 MG tablet Take 1 tablet (20 mg total) by mouth at bedtime. 90 tablet 3   saw palmetto  160 MG capsule Take 1 capsule (160 mg total) by mouth 2 (two) times daily.     sertraline  (ZOLOFT ) 50 MG tablet TAKE 1 TABLET BY MOUTH DAILY 90 tablet 0   sildenafil  (REVATIO ) 20 MG tablet TAKE 3 TO 5 TABLETS BY MOUTH DAILY AS NEEDED 90 tablet 1   tamsulosin  (FLOMAX ) 0.4 MG CAPS capsule Take 2 capsules (0.8 mg total) by mouth daily. 180 capsule 3   traZODone  (DESYREL ) 50 MG tablet Take 1.5 tablets (75 mg total) by mouth at bedtime. 135 tablet 3   vitamin B-12 (CYANOCOBALAMIN ) 1000 MCG tablet Take by mouth.     zinc gluconate 50 MG tablet Take 50 mg by mouth daily.     No  facility-administered medications prior to visit.

## 2023-12-19 ENCOUNTER — Other Ambulatory Visit: Payer: Self-pay | Admitting: Urology

## 2023-12-19 DIAGNOSIS — N401 Enlarged prostate with lower urinary tract symptoms: Secondary | ICD-10-CM

## 2023-12-19 DIAGNOSIS — N5201 Erectile dysfunction due to arterial insufficiency: Secondary | ICD-10-CM

## 2023-12-24 ENCOUNTER — Ambulatory Visit: Payer: Self-pay | Admitting: Student in an Organized Health Care Education/Training Program

## 2024-01-02 DIAGNOSIS — E113393 Type 2 diabetes mellitus with moderate nonproliferative diabetic retinopathy without macular edema, bilateral: Secondary | ICD-10-CM | POA: Diagnosis not present

## 2024-01-02 DIAGNOSIS — H524 Presbyopia: Secondary | ICD-10-CM | POA: Diagnosis not present

## 2024-01-16 ENCOUNTER — Telehealth: Payer: Self-pay

## 2024-01-16 ENCOUNTER — Ambulatory Visit: Payer: Self-pay | Admitting: Family Medicine

## 2024-01-16 ENCOUNTER — Other Ambulatory Visit: Payer: Self-pay | Admitting: Family Medicine

## 2024-01-16 ENCOUNTER — Encounter: Payer: Self-pay | Admitting: Family Medicine

## 2024-01-16 VITALS — BP 138/70 | HR 70 | Ht 71.0 in | Wt 191.4 lb

## 2024-01-16 DIAGNOSIS — N138 Other obstructive and reflux uropathy: Secondary | ICD-10-CM

## 2024-01-16 DIAGNOSIS — E1149 Type 2 diabetes mellitus with other diabetic neurological complication: Secondary | ICD-10-CM

## 2024-01-16 DIAGNOSIS — R931 Abnormal findings on diagnostic imaging of heart and coronary circulation: Secondary | ICD-10-CM | POA: Diagnosis not present

## 2024-01-16 DIAGNOSIS — Z7985 Long-term (current) use of injectable non-insulin antidiabetic drugs: Secondary | ICD-10-CM | POA: Diagnosis not present

## 2024-01-16 DIAGNOSIS — Z0000 Encounter for general adult medical examination without abnormal findings: Secondary | ICD-10-CM

## 2024-01-16 DIAGNOSIS — I10 Essential (primary) hypertension: Secondary | ICD-10-CM

## 2024-01-16 DIAGNOSIS — E1169 Type 2 diabetes mellitus with other specified complication: Secondary | ICD-10-CM

## 2024-01-16 DIAGNOSIS — E785 Hyperlipidemia, unspecified: Secondary | ICD-10-CM

## 2024-01-16 DIAGNOSIS — D61818 Other pancytopenia: Secondary | ICD-10-CM

## 2024-01-16 DIAGNOSIS — R6889 Other general symptoms and signs: Secondary | ICD-10-CM

## 2024-01-16 LAB — POCT GLYCOSYLATED HEMOGLOBIN (HGB A1C): Hemoglobin A1C: 6.2 % — AB (ref 4.0–5.6)

## 2024-01-16 NOTE — Patient Instructions (Addendum)
 Thank you for coming to the office today.  [facility] [address], [place], [place] [address] Phone: [phone] [email or web address]   Recent Labs    01/24/23 0802 07/11/23 0836 01/16/24 0933  HGBA1C 6.1* 6.0* 6.2*   Forgetfulness is okay, goal to keep improving daily reminders and okay to take vitamin supplements if interested.   No sign of cognitive decline at this time  DUE for FASTING BLOOD WORK (no food or drink after midnight before the lab appointment, only water or coffee without cream/sugar on the morning of)  For Lab Results, once available within 2-3 days of blood draw, you can can log in to MyChart online to view your results and a brief explanation. Also, we can discuss results at next follow-up visit.   Please schedule a Follow-up Appointment to: Return in about 6 months (around 07/18/2024) for 6 month fasting lab > 1 week later Annual Physical.  If you have any other questions or concerns, please feel free to call the office or send a message through MyChart. You may also schedule an earlier appointment if necessary.  Additionally, you may be receiving a survey about your experience at our office within a few days to 1 week by e-mail or mail. We value your feedback.  Marsa Officer, DO [facility], [place]

## 2024-01-16 NOTE — Progress Notes (Signed)
 Subjective:    Patient ID: Richard JAYSON Docker Sr., male    DOB: [birth year], 68 y.o.   MRN: [id number]  Richard WALKUP Sr. is a 68 y.o. male presenting on 01/16/2024 for Medical Management of Chronic Issues and Diabetes   HPI  Discussed the use of AI scribe software for clinical note transcription with the patient, who gave verbal consent to proceed.  History of Present Illness   Richard TUGWELL Sr. Richard C. Gaona Sr. is a 68 year old male who presents for a routine follow-up and management of multiple medical conditions.  CHRONIC DM, Type 2 with DM Neuropathy / BMI >26 Hypertension A1c up to 6.2 Some elevated sugars 150-160s Admits some poor diet chocolate ice cream CBGs: Checks CBGs 1-3x daily Meds: - Ozempic  1mg  weekly (PAP program) - Remains off Metformin  - Failed Victoza  in past, OFF Glimepiride , OFF NPH Insulin  Reports good compliance. Tolerating well w/o side-effects Currently on ACEi Chronic issue with known, Bilateral feet, mild loss of sensitivity Taking Vitamin B12 Magnesium, MVI, Melatonin. Admits tingling and neuropathy in feet bilateral. Takes OTC medicine for neuropathy On gabapentin  100mg  TID limited relief Has cataract, considering procedure Denies hypoglycemia, polyuria, visual changes or tingling.  Easy bruising and dermatologic findings - Easy bruising, especially on the arms between wrist and elbow - Takes 81 mg baby aspirin  daily - Dry skin present, possibly contributing to bruising  Visual impairment - Progressive fading of vision - Known cataract in one eye - Ophthalmology follow-up every six months without intervention to date - Considering second opinion due to perceived vision deterioration  Forgetfulness No history suggestive of cognitive decline - Short-term memory lapses, such as forgetting purpose after a few steps - No difficulty with procedural memory or performing tasks  Peripheral neuropathy and balance disturbance - Numbness in feet,  with very slight progression - Balance issues present  Vascular calcification and antiplatelet therapy - History of calcified arteries without blockages - On aspirin  therapy Followed by Cardiology  Pulmonary nodule surveillance - Benign lung nodule under surveillance with periodic scans     Major Depression, recurrent in remission / Insomnia Reports his mood has been good overall without new concerns. Continues on SSRI Sertraline  50mg  daily with great results Continues on Trazodone  1.5 pills for 75mg  nightly for insomnia.   HYPERLIPIDEMIA: - Reports no concerns. Last lipid panel 07/2023 Controlled On Rosuvastatin  5mg  nightly not causing side effects   CHRONIC HTN: Reports no concern Current Meds - Benazepril  40mg  daily   Reports good compliance, took meds today. Tolerating well, w/o complaints. Denies CP, dyspnea, HA, edema, dizziness / lightheadedness   Alcohol dependence, uncomplicated History of heavy drinking with liquor He has resolved that and now reduced significantly He is consuming beer daily 6 drinks per day average.     Health Maintenance:   Updated Shingrix  at pharmacy   Last colonoscopy approx 2008. - Cologuard done 02/07/23 - negative, good for 3 years, next 2027      07/17/2023    8:46 AM 03/08/2023   10:01 AM 01/31/2023    9:04 AM  Depression screen PHQ 2/9  Decreased Interest 0 0 0  Down, Depressed, Hopeless 0 0 0  PHQ - 2 Score 0 0 0  Altered sleeping  0   Tired, decreased energy  0   Change in appetite  0   Feeling bad or failure about yourself   0   Trouble concentrating  0   Moving slowly or fidgety/restless  0  Suicidal thoughts  0   PHQ-9 Score  0   Difficult doing work/chores  Not difficult at all        07/17/2023    8:46 AM 01/31/2023    9:04 AM 09/20/2021    8:09 AM 02/26/2018    9:05 AM  GAD 7 : Generalized Anxiety Score  Nervous, Anxious, on Edge 0 0 0 0  Control/stop worrying 0 0 0 0  Worry too much - different things 0 0 0 0   Trouble relaxing 0 0 0 0  Restless 0 0 0 0  Easily annoyed or irritable 0 0 0 0  Afraid - awful might happen 0 0 0 0  Total GAD 7 Score 0 0 0 0  Anxiety Difficulty   Not difficult at all Not difficult at all    Social History   Tobacco Use   Smoking status: Former    Current packs/day: 0.00    Types: Cigarettes    Start date: 08/07/1989    Quit date: 08/08/1999    Years since quitting: 24.4   Smokeless tobacco: Former  Building services engineer status: Never Used  Substance Use Topics   Alcohol use: Yes    Alcohol/week: 6.0 standard drinks of alcohol    Types: 6 Cans of beer per week   Drug use: No    Review of Systems Per HPI unless specifically indicated above     Objective:    BP 138/70 (BP Location: Left Arm, Patient Position: Sitting, Cuff Size: Normal)   Pulse 70   Ht 5' 11 (1.803 m)   Wt 191 lb 6.4 oz (86.8 kg)   SpO2 97%   BMI 26.69 kg/m   Wt Readings from Last 3 Encounters:  01/16/24 191 lb 6.4 oz (86.8 kg)  12/13/23 192 lb 3.2 oz (87.2 kg)  09/10/23 187 lb 2 oz (84.9 kg)    Physical Exam  I have personally reviewed the radiology report from 12/2023 on CT Chest.  CLINICAL DATA:  Follow-up left lower lobe nodule   EXAM: CT CHEST WITHOUT CONTRAST   TECHNIQUE: Multidetector CT imaging of the chest was performed following the standard protocol without IV contrast.   RADIATION DOSE REDUCTION: This exam was performed according to the departmental dose-optimization program which includes automated exposure control, adjustment of the mA and/or kV according to patient size and/or use of iterative reconstruction technique.   COMPARISON:  07/19/2023   FINDINGS: Cardiovascular: Somewhat limited due to lack of IV contrast. Atherosclerotic calcifications of the thoracic aorta are noted. Coronary calcifications are seen. No cardiac enlargement is noted. The   Mediastinum/Nodes: Thoracic inlet is unremarkable. No hilar or mediastinal adenopathy is noted.  Esophagus is within normal limits.   Lungs/Pleura: Lungs are well aerated bilaterally. Mild scarring is noted in the right middle lobe laterally stable from the prior CT. Apical pleural and parenchymal scarring is noted bilaterally. Stable 9 mm nodule is noted in the left lower lobe best seen on image number 135 of series 3. No other nodules are seen.   Upper Abdomen: Visualized upper abdomen shows nodularity of the liver consistent with underlying cirrhotic change. The spleen is mildly prominent. Cholelithiasis is seen without complicating factors.   Musculoskeletal: No acute bony abnormality is noted.   IMPRESSION: Stable 9 mm left lower lobe nodule. This has shown no metabolic activity on prior PET-CT. Follow-up in 12-18 months is recommended to assess for stability.   Changes consistent with cirrhosis of the liver with splenomegaly.  Cholelithiasis.   Aortic Atherosclerosis (ICD10-I70.0).     Electronically Signed   By: Oneil Devonshire M.D.   On: 12/20/2023 00:46     03/08/2023   10:06 AM 03/06/2022    8:37 AM 02/28/2021    8:23 AM  6CIT Screen  What Year? 0 points 0 points 0 points  What month? 0 points 0 points 0 points  What time? 0 points 0 points 0 points  Count back from 20 4 points 0 points 0 points  Months in reverse 0 points 2 points 4 points  Repeat phrase 4 points 0 points 0 points  Total Score 8 points 2 points 4 points      Results for orders placed or performed in visit on 01/16/24  POCT glycosylated hemoglobin (Hb A1C)   Collection Time: 01/16/24  9:33 AM  Result Value Ref Range   Hemoglobin A1C 6.2 (A) 4.0 - 5.6 %   HbA1c POC (<> result, manual entry)     HbA1c, POC (prediabetic range)     HbA1c, POC (controlled diabetic range)        Assessment & Plan:   Problem List Items Addressed This Visit     DM (diabetes mellitus), type 2 with neurological complications (HCC) - Primary   Relevant Orders   POCT glycosylated hemoglobin (Hb A1C)  (Completed)   Essential hypertension   Hyperlipidemia associated with type 2 diabetes mellitus (HCC)   Other Visit Diagnoses       Elevated coronary artery calcium  score         Forgetfulness         Long-term current use of injectable noninsulin antidiabetic medication            Type 2 Diabetes Mellitus HbA1c at 6.2 indicates slight worsening of glycemic control. Dietary indiscretions noted. - Advise dietary modifications to manage blood glucose levels Continue Ozempic  1mg  weekly, patient assistance program. - Schedule follow-up in six months to reassess diabetes management.  Followed by Cardiology - Continue aspirin  therapy as per cardiologist's recommendation.  Easy Bruising Likely due to daily low-dose aspirin . No significant harm or risk of serious bleeding. - Advise daily moisturizers to improve skin elasticity and reduce bruising. - Continue aspirin  therapy as per cardiologist's recommendation.  Cataract Worsening vision in one eye. Considering second opinion due to dissatisfaction with current management. - Provide information about [facility] for a potential second opinion. - Encourage discussion of cataract management plan with current ophthalmologist.  Benign Lung Nodule Unchanged on recent CT scans.  Forgetfulness Age-Related Memory Changes Assessed as age-related memory changes. Retains cognitive function. - Advise memory exercises and use of reminders to manage forgetfulness. - Discuss that vitamin supplements for memory are not proven but can be tried if desired.  General Health Maintenance Up to date with vaccinations and screenings. - Obtain a copy of the recent eye exam from [facility]. - Continue routine health maintenance and screenings as scheduled.        Orders Placed This Encounter  Procedures   POCT glycosylated hemoglobin (Hb A1C)    Associate with Z13.1    No orders of the defined types were placed in this encounter.   Follow  up plan: Return in about 6 months (around 07/18/2024) for 6 month fasting lab > 1 week later Annual Physical.  Future labs ordered for 07/2024, hold PSA, urology is checking  Marsa Officer, DO [facility] 01/16/2024, 9:41 AM

## 2024-01-16 NOTE — Telephone Encounter (Signed)
 Tried calling patient no answer or VM   Ozempic  shipment received. Can pick up at his convenience

## 2024-01-24 ENCOUNTER — Encounter: Payer: Self-pay | Admitting: Pharmacist

## 2024-01-24 ENCOUNTER — Other Ambulatory Visit: Payer: Self-pay | Admitting: Pharmacist

## 2024-01-24 DIAGNOSIS — E1149 Type 2 diabetes mellitus with other diabetic neurological complication: Secondary | ICD-10-CM

## 2024-01-24 DIAGNOSIS — Z7985 Long-term (current) use of injectable non-insulin antidiabetic drugs: Secondary | ICD-10-CM

## 2024-01-24 NOTE — Patient Instructions (Signed)
 As of 7/25, Ozempic  will no longer be automatically refilled from [employer] patient assistance program. To request refills after this date, program must receive a refill request form from office up to 30 days prior to refill being due.   Please monitor your supply of Ozempic  at home and when you have only a 1 month supply remaining, contact [place] [place] at [phone] to request she start refill process for you.   Thank you!   Sharyle Sia, PharmD, [facility] Clinical Pharmacist [facility] [phone]

## 2024-01-24 NOTE — Progress Notes (Signed)
   01/24/2024  Patient ID: Richard JAYSON Docker Sr., male   DOB: 13-Nov-1955, 68 y.o.   MRN: [id number]  Patient enrolled in Ozempic  patient assistance program from [employer].  As of 7/25, Ozempic  will no longer be eligible for automatic refill from [employer]. To request refills after this date, program must receive a refill request form from office up to 30 days prior to refill being due.  Outreach to patient today and provide this update. Advise patient to monitor her supply of Ozempic  at home and when has only a 1 month supply remaining, to contact Richard Day: Phone: [phone] to request CPhT start refill form with PCP to be sent to program.  Sharyle Sia, PharmD, Richard Day, CPP Clinical Pharmacist [facility] [phone]

## 2024-01-28 ENCOUNTER — Encounter: Payer: Self-pay | Admitting: Family Medicine

## 2024-01-28 DIAGNOSIS — F339 Major depressive disorder, recurrent, unspecified: Secondary | ICD-10-CM

## 2024-02-04 NOTE — Addendum Note (Signed)
 Addended by: EDMAN MARSA PARAS on: 02/04/2024 07:11 PM   Modules accepted: Orders

## 2024-03-08 ENCOUNTER — Other Ambulatory Visit: Payer: Self-pay | Admitting: Family Medicine

## 2024-03-08 DIAGNOSIS — F339 Major depressive disorder, recurrent, unspecified: Secondary | ICD-10-CM

## 2024-03-10 NOTE — Telephone Encounter (Signed)
 Requested Prescriptions  Pending Prescriptions Disp Refills   sertraline  (ZOLOFT ) 50 MG tablet [Pharmacy Med Name: SERTRALINE  HCL 50 MG TABLET] 90 tablet 0    Sig: TAKE 1 TABLET BY MOUTH DAILY     Psychiatry:  Antidepressants - SSRI - sertraline  Failed - 03/10/2024 11:39 AM      Failed - AST in normal range and within 360 days    AST  Date Value Ref Range Status  07/11/2023 40 (H) 10 - 35 U/L Final         Passed - ALT in normal range and within 360 days    ALT  Date Value Ref Range Status  07/11/2023 32 9 - 46 U/L Final         Passed - Completed PHQ-2 or PHQ-9 in the last 360 days      Passed - Valid encounter within last 6 months    Recent Outpatient Visits           1 month ago DM (diabetes mellitus), type 2 with neurological complications [facility])   [place] [facility] [place], Marsa PARAS, DO       Future Appointments             In 3 months [name], Glendia BROCKS, MD [facility] [place]   In 4 months Edman, Marsa PARAS, DO [place] [facility], Main [address]

## 2024-03-13 ENCOUNTER — Ambulatory Visit: Payer: Medicare HMO

## 2024-03-13 DIAGNOSIS — Z0000 Encounter for general adult medical examination without abnormal findings: Secondary | ICD-10-CM | POA: Diagnosis not present

## 2024-03-13 NOTE — Progress Notes (Signed)
 Subjective:   Richard JAYSON Docker Sr. is a 68 y.o. who presents for a Medicare Wellness preventive visit.  As a reminder, Annual Wellness Visits don't include a physical exam, and some assessments may be limited, especially if this visit is performed virtually. We may recommend an in-person follow-up visit with your provider if needed.  Visit Complete: Virtual I connected with  Richard JAYSON Wolfley Sr. on 03/13/24 by a audio enabled telemedicine application and verified that I am speaking with the correct person using two identifiers.  Patient Location: Home  Provider Location: Home Office  I discussed the limitations of evaluation and management by telemedicine. The patient expressed understanding and agreed to proceed.  Vital Signs: Because this visit was a virtual/telehealth visit, some criteria may be missing or patient reported. Any vitals not documented were not able to be obtained and vitals that have been documented are patient reported.  VideoDeclined- This patient declined Librarian, academic. Therefore the visit was completed with audio only.  Persons Participating in Visit: Patient.  AWV Questionnaire: No: Patient Medicare AWV questionnaire was not completed prior to this visit.  Cardiac Risk Factors include: advanced age (>36men, >41 women);diabetes mellitus;dyslipidemia;hypertension;male gender     Objective:    There were no vitals filed for this visit. There is no height or weight on file to calculate BMI.     03/13/2024    9:41 AM 03/08/2023   10:03 AM 11/06/2021    9:59 AM 10/06/2021    3:23 PM 02/28/2021    8:21 AM  Advanced Directives  Does Patient Have a Medical Advance Directive? No No No No No  Would patient like information on creating a medical advance directive? No - Patient declined No - Patient declined  Yes (MAU/Ambulatory/Procedural Areas - Information given)     Current Medications (verified) Outpatient Encounter Medications as of  03/13/2024  Medication Sig   albuterol  (VENTOLIN  HFA) 108 (90 Base) MCG/ACT inhaler Inhale 1-2 puffs into the lungs every 6 (six) hours as needed for wheezing or shortness of breath.   allopurinol  (ZYLOPRIM ) 300 MG tablet TAKE 1 TABLET BY MOUTH DAILY   aspirin  EC 81 MG tablet Take 1 tablet (81 mg total) by mouth daily. Swallow whole.   Azelastine  HCl 137 MCG/SPRAY SOLN SPRAY 1 SPRAY IN EACH NOSTRIL TWICE DAILY   benazepril  (LOTENSIN ) 40 MG tablet Take 1 tablet (40 mg total) by mouth daily.   Blood Glucose Calibration (OT ULTRA/FASTTK CNTRL SOLN) SOLN Use as instructed to check blood sugars twice a day.   Blood Glucose Monitoring Suppl (ONE TOUCH ULTRA 2) w/Device KIT Use as instructed to check blood sugars twice a day.   gabapentin  (NEURONTIN ) 100 MG capsule TAKE ONE CAPSULE BY MOUTH THREE TIMES A DAY   ketoconazole  (NIZORAL ) 2 % shampoo Apply 1 application topically 2 (two) times a week. As needed for scalp dermatitis   Lancets Misc. (ONE TOUCH SURESOFT) MISC Use as instructed to check blood sugars twice a day.   Magnesium Gluconate 500 (27 MG) MG TABS Take 1 tablet by mouth every evening.    melatonin 5 MG TABS Take 1 tablet by mouth at bedtime.    MULTIPLE VITAMIN PO Take by mouth.   ONETOUCH ULTRA test strip Use as instructed to check blood sugars twice a day.   OZEMPIC , 1 MG/DOSE, 4 MG/3ML SOPN Inject 1 mg into the skin once a week.   rosuvastatin  (CRESTOR ) 20 MG tablet Take 1 tablet (20 mg total) by mouth at  bedtime.   saw palmetto  160 MG capsule Take 1 capsule (160 mg total) by mouth 2 (two) times daily.   sertraline  (ZOLOFT ) 50 MG tablet TAKE 1 TABLET BY MOUTH DAILY   sildenafil  (REVATIO ) 20 MG tablet TAKE 3 TO 5 TABLETS BY MOUTH DAILY AS NEEDED   tamsulosin  (FLOMAX ) 0.4 MG CAPS capsule Take 2 capsules (0.8 mg total) by mouth daily.   traZODone  (DESYREL ) 50 MG tablet Take 2 tablets (100 mg total) by mouth at bedtime.   vitamin B-12 (CYANOCOBALAMIN ) 1000 MCG tablet Take by mouth.    vitamin C (ASCORBIC ACID) 250 MG tablet Take 250 mg by mouth daily.   zinc gluconate 50 MG tablet Take 50 mg by mouth daily.   No facility-administered encounter medications on file as of 03/13/2024.    Allergies (verified) Bee venom   History: Past Medical History:  Diagnosis Date   Anxiety    Cholelithiasis    DM (diabetes mellitus), type 2 with neurological complications (HCC)    GERD (gastroesophageal reflux disease)    Hyperlipidemia    Pancytopenia (HCC)    Seasonal allergic rhinitis due to other allergic trigger    Past Surgical History:  Procedure Laterality Date   Lung Collapse  68 years old.    Family History  Problem Relation Age of Onset   Diabetes Father        complications of DM caused death   Heart attack Father    Diabetes Sister    Cancer Mother        lung   Lung cancer Mother    Social History   Socioeconomic History   Marital status: Married    Spouse name: Shahin Knierim   Number of children: 2   Years of education: Not on file   Highest education level: Some college, no degree  Occupational History   Occupation: retired  Tobacco Use   Smoking status: Former    Current packs/day: 0.00    Types: Cigarettes    Start date: 08/07/1989    Quit date: 08/08/1999    Years since quitting: 24.6   Smokeless tobacco: Former  Building services engineer status: Never Used  Substance and Sexual Activity   Alcohol use: Yes    Alcohol/week: 6.0 standard drinks of alcohol    Types: 6 Cans of beer per week   Drug use: No   Sexual activity: Yes  Other Topics Concern   Not on file  Social History Narrative   Not on file   Social Drivers of Health   Financial Resource Strain: Low Risk  (03/13/2024)   Overall Financial Resource Strain (CARDIA)    Difficulty of Paying Living Expenses: Not hard at all  Food Insecurity: No Food Insecurity (03/13/2024)   Hunger Vital Sign    Worried About Running Out of Food in the Last Year: Never true    Ran Out of Food in the  Last Year: Never true  Transportation Needs: No Transportation Needs (03/13/2024)   PRAPARE - Administrator, Civil Service (Medical): No    Lack of Transportation (Non-Medical): No  Physical Activity: Sufficiently Active (03/13/2024)   Exercise Vital Sign    Days of Exercise per Week: 5 days    Minutes of Exercise per Session: 30 min  Stress: No Stress Concern Present (03/13/2024)   [employer] of Occupational Health - Occupational Stress Questionnaire    Feeling of Stress: Not at all  Social Connections: Moderately Integrated (03/13/2024)   Social  Connection and Isolation Panel    Frequency of Communication with Friends and Family: Three times a week    Frequency of Social Gatherings with Friends and Family: Twice a week    Attends Religious Services: Never    Database administrator or Organizations: Yes    Attends Engineer, structural: More than 4 times per year    Marital Status: Married  Recent Concern: Social Connections - Moderately Isolated (01/12/2024)   Social Connection and Isolation Panel    Frequency of Communication with Friends and Family: Twice a week    Frequency of Social Gatherings with Friends and Family: More than three times a week    Attends Religious Services: Patient declined    Database administrator or Organizations: No    Attends Engineer, structural: Not on file    Marital Status: Married    Tobacco Counseling Counseling given: Not Answered    Clinical Intake:  Pre-visit preparation completed: Yes  Pain : No/denies pain     BMI - recorded: 26.6 Nutritional Status: BMI 25 -29 Overweight Nutritional Risks: None Diabetes: Yes CBG done?: No Did pt. bring in CBG monitor from home?: No  Lab Results  Component Value Date   HGBA1C 6.2 (A) 01/16/2024   HGBA1C 6.0 (H) 07/11/2023   HGBA1C 6.1 (H) 01/24/2023     How often do you need to have someone help you when you read instructions, pamphlets, or other written  materials from your doctor or pharmacy?: 1 - Never  Interpreter Needed?: No  Information entered by :: JHONNIE DAS, LPN   Activities of Daily Living    03/13/2024    9:43 AM  In your present state of health, do you have any difficulty performing the following activities:  Hearing? 1  Vision? 0  Difficulty concentrating or making decisions? 0  Walking or climbing stairs? 0  Dressing or bathing? 0  Doing errands, shopping? 0  Preparing Food and eating ? N  Using the Toilet? N  In the past six months, have you accidently leaked urine? N  Do you have problems with loss of bowel control? N  Managing your Medications? N  Managing your Finances? N  Housekeeping or managing your Housekeeping? N    Patient Care Team: Edman Marsa PARAS, DO as PCP - General (Family Medicine) Alana, Sharyle LABOR, RPH-CPP as Pharmacist (Pharmacist) Babara Call, MD as Consulting Physician (Hematology and Oncology) [facility] Isadora Hose, MD as Consulting Physician (Pulmonary Disease)  I have updated your Care Teams any recent Medical Services you may have received from other providers in the past year.     Assessment:   This is a routine wellness examination for Qais.  Hearing/Vision screen Hearing Screening - Comments:: WEARS AIDS, BOTH EARS Vision Screening - Comments:: READERS- DR.[email or web address]   Goals Addressed             This Visit's Progress    DIET - INCREASE WATER INTAKE         Depression Screen     03/13/2024    9:40 AM 07/17/2023    8:46 AM 03/08/2023   10:01 AM 01/31/2023    9:04 AM 01/31/2023    8:41 AM 03/06/2022    8:09 AM 09/20/2021    8:08 AM  PHQ 2/9 Scores  PHQ - 2 Score 0 0 0 0 0 0 0  PHQ- 9 Score 0  0  0  1    Fall Risk  03/13/2024    9:42 AM 07/17/2023    8:46 AM 03/08/2023   10:04 AM 03/08/2023    7:52 AM 01/31/2023    9:04 AM  Fall Risk   Falls in the past year? 1 1 1 1 1   Number falls in past yr: 1 1 1 1 1   Injury with Fall? 1 1 0 0 0   Risk for fall due to :   History of fall(s)    Follow up Falls evaluation completed;Falls prevention discussed  Falls prevention discussed;Falls evaluation completed      MEDICARE RISK AT HOME:  Medicare Risk at Home Any stairs in or around the home?: Yes If so, are there any without handrails?: No Home free of loose throw rugs in walkways, pet beds, electrical cords, etc?: Yes Adequate lighting in your home to reduce risk of falls?: Yes Life alert?: No Use of a cane, walker or w/c?: No Grab bars in the bathroom?: Yes Shower chair or bench in shower?: No Elevated toilet seat or a handicapped toilet?: No  TIMED UP AND GO:  Was the test performed?  No  Cognitive Function: 6CIT completed        03/13/2024    9:45 AM 03/08/2023   10:06 AM 03/06/2022    8:37 AM 02/28/2021    8:23 AM  6CIT Screen  What Year? 0 points 0 points 0 points 0 points  What month? 0 points 0 points 0 points 0 points  What time? 0 points 0 points 0 points 0 points  Count back from 20 0 points 4 points 0 points 0 points  Months in reverse 0 points 0 points 2 points 4 points  Repeat phrase 0 points 4 points 0 points 0 points  Total Score 0 points 8 points 2 points 4 points    Immunizations Immunization History  Administered Date(s) Administered   Fluad Quad(high Dose 65+) 06/15/2020, 07/12/2022   INFLUENZA, HIGH DOSE SEASONAL PF 03/19/2023   Influenza,inj,Quad PF,6+ Mos 03/17/2014, 04/12/2016, 05/06/2017, 06/02/2018, 06/10/2019   Influenza-Unspecified 04/08/2014, 05/11/2015, 06/08/2021, 03/19/2023   PFIZER(Purple Top)SARS-COV-2 Vaccination 10/27/2019, 11/17/2019, 06/07/2020   PNEUMOCOCCAL CONJUGATE-20 07/25/2023   Pfizer Covid-19 Vaccine Bivalent Booster 60yrs & up 06/08/2021   Pfizer(Comirnaty)Fall Seasonal Vaccine 12 years and older 03/19/2023, 09/23/2023   Pneumococcal Conjugate-13 12/15/2019   Pneumococcal Polysaccharide-23 03/09/2014, 04/22/2014, 03/06/2022   Tdap 07/09/2008, 04/08/2014   Zoster  Recombinant(Shingrix ) 07/12/2022, 10/30/2022, 03/19/2023    Screening Tests Health Maintenance  Topic Date Due   OPHTHALMOLOGY EXAM  01/14/2024   Influenza Vaccine  02/07/2024   DTaP/Tdap/Td (3 - Td or Tdap) 04/08/2024   Diabetic kidney evaluation - eGFR measurement  07/10/2024   Diabetic kidney evaluation - Urine ACR  07/16/2024   FOOT EXAM  07/16/2024   HEMOGLOBIN A1C  07/18/2024   Medicare Annual Wellness (AWV)  03/13/2025   Fecal DNA (Cologuard)  02/06/2026   Pneumococcal Vaccine: 50+ Years  Completed   COVID-19 Vaccine  Completed   Hepatitis C Screening  Completed   Zoster Vaccines- Shingrix   Completed   HPV VACCINES  Aged Out   Meningococcal B Vaccine  Aged Out   Colonoscopy  Discontinued    Health Maintenance  Health Maintenance Due  Topic Date Due   OPHTHALMOLOGY EXAM  01/14/2024   Influenza Vaccine  02/07/2024   Health Maintenance Items Addressed: UP TO DATE W/ SHOTS; UP TO DATE W/ COLOGUARD  Additional Screening:  Vision Screening: Recommended annual ophthalmology exams for early detection of glaucoma and other disorders of  the eye. Would you like a referral to an eye doctor? No    Dental Screening: Recommended annual dental exams for proper oral hygiene  Community Resource Referral / Chronic Care Management: CRR required this visit?  No   CCM required this visit?  No   Plan:    I have personally reviewed and noted the following in the patient's chart:   Medical and social history Use of alcohol, tobacco or illicit drugs  Current medications and supplements including opioid prescriptions. Patient is not currently taking opioid prescriptions. Functional ability and status Nutritional status Physical activity Advanced directives List of other physicians Hospitalizations, surgeries, and ER visits in previous 12 months Vitals Screenings to include cognitive, depression, and falls Referrals and appointments  In addition, I have reviewed and  discussed with patient certain preventive protocols, quality metrics, and best practice recommendations. A written personalized care plan for preventive services as well as general preventive health recommendations were provided to patient.   Jhonnie GORMAN Das, LPN   [id number]   After Visit Summary: (MyChart) Due to this being a telephonic visit, the after visit summary with patients personalized plan was offered to patient via MyChart   Notes: Nothing significant to report at this time.

## 2024-03-13 NOTE — Patient Instructions (Addendum)
 Richard Day,  Thank you for taking the time for your Medicare Wellness Visit. I appreciate your continued commitment to your health goals. Please review the care plan we discussed, and feel free to reach out if I can assist you further.  Medicare recommends these wellness visits once per year to help you and your care team stay ahead of potential health issues. These visits are designed to focus on prevention, allowing your provider to concentrate on managing your acute and chronic conditions during your regular appointments.  Please note that Annual Wellness Visits do not include a physical exam. Some assessments may be limited, especially if the visit was conducted virtually. If needed, we may recommend a separate in-person follow-up with your provider.  Ongoing Care Seeing your primary care provider every 3 to 6 months helps us  monitor your health and provide consistent, personalized care.   Referrals If a referral was made during today's visit and you haven't received any updates within two weeks, please contact the referred provider directly to check on the status.  Recommended Screenings:  Health Maintenance  Topic Date Due   Eye exam for diabetics  01/14/2024   Flu Shot  02/07/2024   DTaP/Tdap/Td vaccine (3 - Td or Tdap) 04/08/2024   Yearly kidney function blood test for diabetes  07/10/2024   Yearly kidney health urinalysis for diabetes  07/16/2024   Complete foot exam   07/16/2024   Hemoglobin A1C  07/18/2024   Medicare Annual Wellness Visit  03/13/2025   Cologuard (Stool DNA test)  02/06/2026   Pneumococcal Vaccine for age over 56  Completed   COVID-19 Vaccine  Completed   Hepatitis C Screening  Completed   Zoster (Shingles) Vaccine  Completed   HPV Vaccine  Aged Out   Meningitis B Vaccine  Aged Out   Colon Cancer Screening  Discontinued     Advance Care Planning is important because it: Ensures you receive medical care that aligns with your values, goals, and  preferences. Provides guidance to your family and loved ones, reducing the emotional burden of decision-making during critical moments.  Vision: Annual vision screenings are recommended for early detection of glaucoma, cataracts, and diabetic retinopathy. These exams can also reveal signs of chronic conditions such as diabetes and high blood pressure.  Dental: Annual dental screenings help detect early signs of oral cancer, gum disease, and other conditions linked to overall health, including heart disease and diabetes.  Please see the attached documents for additional preventive care recommendations. AWV 03/26/25 @  10:10 AM BY PHONE

## 2024-03-16 ENCOUNTER — Encounter: Payer: Self-pay | Admitting: Family Medicine

## 2024-03-16 DIAGNOSIS — N401 Enlarged prostate with lower urinary tract symptoms: Secondary | ICD-10-CM

## 2024-03-16 DIAGNOSIS — N5201 Erectile dysfunction due to arterial insufficiency: Secondary | ICD-10-CM

## 2024-03-16 MED ORDER — SILDENAFIL CITRATE 20 MG PO TABS: ORAL_TABLET | ORAL | 1 refills | Status: DC

## 2024-03-16 MED ORDER — ALLOPURINOL 300 MG PO TABS: 300.0000 mg | ORAL_TABLET | Freq: Every day | ORAL | 3 refills | Status: AC

## 2024-04-01 ENCOUNTER — Telehealth: Payer: Self-pay

## 2024-04-01 NOTE — Telephone Encounter (Signed)
 Completed refill form ([employer]) for Ozempic  and faxed provider's office for review and signature.

## 2024-04-07 ENCOUNTER — Encounter: Payer: Self-pay | Admitting: Pharmacist

## 2024-04-07 ENCOUNTER — Other Ambulatory Visit: Payer: Self-pay | Admitting: Pharmacist

## 2024-04-07 DIAGNOSIS — Z7985 Long-term (current) use of injectable non-insulin antidiabetic drugs: Secondary | ICD-10-CM

## 2024-04-07 DIAGNOSIS — E1149 Type 2 diabetes mellitus with other diabetic neurological complication: Secondary | ICD-10-CM

## 2024-04-07 NOTE — Progress Notes (Signed)
   04/07/2024  Patient ID: Richard JAYSON Docker Sr., male   DOB: 10-15-1955, 68 y.o.   MRN: [id number]  Patient enrolled in Ozempic  patient assistance program from [employer] through 07/08/2024  [employer] has announced that they will no longer offer Ozempic  to Medicare beneficiaries through their Patient Assistance Program in 2026.   Outreach to patient today and provide this update.   Based on reported income, patient does not meet criteria for Extra Help subsidy  Counsel patient that Medicare's Annual Enrollment Period for 2026 starts 04/22/2024. Encourage patient to review deductibles formulary coverage and copay amounts (including for Ozempic ) between plans. Also share with patient the contact information for the [facility] Information Program [facility]) that can help with reviewing these Medicare plans As requested will share this information with patient via MyChart message  Sharyle Sia, PharmD, JAQUELINE, CPP Clinical Pharmacist [facility] [phone]

## 2024-04-07 NOTE — Patient Instructions (Signed)
 [employer], the maker of Ozempic  and Rybelsus, has announced that they will no longer offer these medications to Medicare beneficiaries through their Patient Assistance Program in 2026.    This means that if you currently receive Ozempic  or Rybelsus at no cost through the program, starting in January 2026 you'll need to use your insurance to continue on these medicines.      Choosing a Medicare Plan   With Medicare's Annual Enrollment Period starting on October 15th, we encourage you to review formulary coverage and copay amounts for Ozempic  or Rybelsus, as costs can vary widely between plans. Starting on Wednesday, October 1st you can compare your Medicare plan options by going to the Plan Finder tool at [employer] ( TeleconferenceOnDemand.fr ).    There are Medicare Specialists through the [facility] Insurance Information Program ([place]) that can help you shop for Medicare plans.    [facility] toll free number: [phone] (Monday - Friday 8 am - 5 pm)   There are representatives located in each county:    [facility] [address]     [place]  [phone] Call for an appointment with [facility]     Maximum Out-of-Pocket and Prescription Payment Plan   In 2026, the maximum yearly out-of-pocket cost for medications is $2,100 for all Medicare beneficiaries. This means you will not have to pay more than $2,100 in total for all prescription medications filled on your Medicare plan in 2026. You may also choose to enroll in a Medicare Prescription Payment Plan through your chosen 2026 Medicare plan. This lets you spread out your prescription drug costs over the year instead of laying a large amount all at once at the pharmacy.    Sharyle Sia, PharmD, JAQUELINE, CPP Clinical Pharmacist [facility] [phone]

## 2024-04-20 ENCOUNTER — Other Ambulatory Visit: Payer: Self-pay | Admitting: Pharmacist

## 2024-04-20 DIAGNOSIS — Z7985 Long-term (current) use of injectable non-insulin antidiabetic drugs: Secondary | ICD-10-CM

## 2024-04-20 DIAGNOSIS — E1149 Type 2 diabetes mellitus with other diabetic neurological complication: Secondary | ICD-10-CM

## 2024-04-20 NOTE — Progress Notes (Signed)
 04/20/2024 Name: Richard Day Richard Sr. MRN: [id number] DOB: [birth year]  Chief Complaint  Patient presents with   Medication Assistance   Medication Management    Richard GRAUMANN Sr. is a 68 y.o. year old male who presented for a telephone visit.   They were referred to the pharmacist by their PCP for assistance in managing medication access.      Subjective:   Care Team: Primary Care Provider: Edman Marsa PARAS, DO ; Next Scheduled Visit: 07/23/2024 Urology: Richard Glendia BROCKS, MD Cardiology: Richard Evalene PARAS, MD    Medication Access/Adherence  Current Pharmacy:  [facility] [id number] - [place], [place] - [address] [place] [address] Phone: [phone] Fax: [phone]   Patient reports affordability concerns with their medications: No  Patient reports access/transportation concerns to their pharmacy: No  Patient reports adherence concerns with their medications:  No     Reports that he is scheduled for an appointment with [facility] Information Program [facility] Richard Day) for help with reviewing Medicare plans at the end of the month   Diabetes:   Current medications: Ozempic  1 mg weekly on Thursdays Medications tried in the past: metformin     Current glucose readings: recalls recent fasting readings ranging 90-140   Denies symptoms of hypoglycemia   Reports having regular well-balanced meals, while controlling carbohydrate portion sizes   Current physical activity: reports staying active throughout the day   Statin: rosuvastatin  20 mg daily   Current medication access support: Enrolled in patient assistance for Ozempic  patient assistance program through [employer] through 07/08/2024 - [employer] has announced that they will no longer offer Ozempic  to Medicare beneficiaries through their Patient Assistance Program in 2026    Hypertension:  Current medications: benazepril  40 mg daily  Recalls last checked home  blood pressure last week; recalls reading ~120/80  Patient denies hypotensive s/sx including dizziness, lightheadedness.   Current physical activity: reports staying active throughout the day    Objective:  Lab Results  Component Value Date   HGBA1C 6.2 (A) 01/16/2024    Lab Results  Component Value Date   CREATININE 1.22 07/11/2023   BUN 22 07/11/2023   NA 136 07/11/2023   K 4.9 07/11/2023   CL 101 07/11/2023   CO2 27 07/11/2023    Lab Results  Component Value Date   CHOL 152 07/11/2023   HDL 51 07/11/2023   LDLCALC 81 07/11/2023   TRIG 103 07/11/2023   CHOLHDL 3.0 07/11/2023   BP Readings from Last 3 Encounters:  01/16/24 138/70  12/13/23 138/80  09/10/23 136/68   Pulse Readings from Last 3 Encounters:  01/16/24 70  12/13/23 75  09/10/23 67     Medications Reviewed Today     Reviewed by Richard Day, RPH-CPP (Pharmacist) on 04/20/24 at [phone]  Med List Status: <None>   Medication Order Taking? Sig Documenting Provider Last Dose Status Informant  albuterol  (VENTOLIN  HFA) 108 (90 Base) MCG/ACT inhaler [id number]  Inhale 1-2 puffs into the lungs every 6 (six) hours as needed for wheezing or shortness of breath. Gollan, Timothy J, MD  Active   allopurinol  (ZYLOPRIM ) 300 MG tablet [id number]  Take 1 tablet (300 mg total) by mouth daily. Edman Marsa PARAS, DO  Active   aspirin  EC 81 MG tablet [id number]  Take 1 tablet (81 mg total) by mouth daily. Swallow whole. Gollan, Timothy J, MD  Active   Azelastine  HCl 137 MCG/SPRAY SOLN [id number]  SPRAY 1  SPRAY IN EACH NOSTRIL TWICE DAILY Edman Marsa PARAS, DO  Active   benazepril  (LOTENSIN ) 40 MG tablet [id number] Yes Take 1 tablet (40 mg total) by mouth daily. Edman Marsa PARAS, DO  Active   Blood Glucose Calibration (OT ULTRA/FASTTK CNTRL SOLN) SOLN [id number]  Use as instructed to check blood sugars twice a day. [name], Marsa PARAS, DO  Active   Blood Glucose Monitoring Suppl (ONE TOUCH ULTRA 2)  w/Device KIT [id number]  Use as instructed to check blood sugars twice a day. Edman Marsa PARAS, DO  Active   gabapentin  (NEURONTIN ) 100 MG capsule [id number] Yes TAKE ONE CAPSULE BY MOUTH THREE TIMES A DAY [name], Alexander PARAS, DO  Active   ketoconazole  (NIZORAL ) 2 % shampoo [id number]  Apply 1 application topically 2 (two) times a week. As needed for scalp dermatitis [name], Marsa PARAS, DO  Active   Lancets Misc. (ONE TOUCH SURESOFT) MISC [id number]  Use as instructed to check blood sugars twice a day. Edman Marsa PARAS, DO  Active   Magnesium Gluconate 500 (27 MG) MG TABS [id number]  Take 1 tablet by mouth every evening.  Richard Day., MD  Active            Med Note LULA, [facility] M   Thu Jan 31, 2023  8:25 AM)    melatonin 5 MG TABS [id number]  Take 1 tablet by mouth at bedtime.  [provider]  Active   MULTIPLE VITAMIN PO [id number]  Take by mouth. Richard Day., MD  Active            Med Note LULA, [facility] M   Thu Jan 31, 2023  8:25 AM)    Richard Day test strip [id number]  Use as instructed to check blood sugars twice a day. Edman Marsa PARAS, DO  Active   OZEMPIC , 1 MG/DOSE, 4 MG/3ML SOPN [id number] Yes Inject 1 mg into the skin once a week. Edman Marsa PARAS, DO  Active   rosuvastatin  (CRESTOR ) 20 MG tablet [id number] Yes Take 1 tablet (20 mg total) by mouth at bedtime. Gollan, Timothy J, MD  Active   saw palmetto  160 MG capsule [id number]  Take 1 capsule (160 mg total) by mouth 2 (two) times daily. Edman Marsa PARAS, DO  Active   sertraline  (ZOLOFT ) 50 MG tablet [id number]  TAKE 1 TABLET BY MOUTH DAILY Edman Marsa PARAS, DO  Active   sildenafil  (REVATIO ) 20 MG tablet [id number]  TAKE 3 TO 5 TABLETS BY MOUTH DAILY AS NEEDED Edman Marsa PARAS, DO  Active     Discontinued 04/20/24 0837   traZODone  (DESYREL ) 50 MG tablet [id number]  Take 2 tablets (100 mg total) by mouth at bedtime. Edman Marsa PARAS, DO   Active   vitamin B-12 (CYANOCOBALAMIN ) 1000 MCG tablet [id number]  Take by mouth. [provider]  Active   vitamin C (ASCORBIC ACID) 250 MG tablet [id number]  Take 250 mg by mouth daily. [provider]  Active   zinc gluconate 50 MG tablet [id number]  Take 50 mg by mouth daily. [provider]  Active               Assessment/Plan:   Patient scheduled for an appointment with [facility] Information Program [facility]) for help with reviewing Medicare plans at the end of the month  Diabetes: - Currently controlled - Discuss importance of having regular well-balanced meals, while controlling carbohydrate portion sizes - Encourage patient to continue to monitor home blood  sugar, keep log of results and have this record to review at medical appointments Patient to contact office or clinical pharmacist sooner if needed for readings outside of established parameters or new symptoms    Hypertension: - Currently controlled - Recommend to monitor home blood pressure, keep log of results and have this record to review at upcoming medical appointments. Patient to contact provider office sooner if needed for readings outside of established parameters or symptoms      Follow Up Plan:   Patient denies further medication questions or concerns today Provide patient with contact information for clinic pharmacist to contact if needed in future for medication questions/concerns    Sharyle Sia, PharmD, JAQUELINE, CPP Clinical Pharmacist [facility] [phone]

## 2024-04-20 NOTE — Patient Instructions (Signed)
 Goals Addressed             This Visit's Progress    Pharmacy Goals       Our goal A1c is less than 7%. This corresponds with fasting sugars less than 130 and 2 hour after meal sugars less than 180. Please continue keep a log of results when you check your blood sugar at home.  Feel free to call me with any questions or concerns.   Sharyle Sia, PharmD, [facility] Clinical Pharmacist [facility] [phone]

## 2024-06-12 ENCOUNTER — Other Ambulatory Visit: Payer: Self-pay

## 2024-06-15 ENCOUNTER — Other Ambulatory Visit

## 2024-06-19 ENCOUNTER — Ambulatory Visit: Payer: Self-pay | Admitting: Urology

## 2024-07-16 ENCOUNTER — Other Ambulatory Visit: Payer: Self-pay | Admitting: Family Medicine

## 2024-07-16 ENCOUNTER — Other Ambulatory Visit

## 2024-07-16 DIAGNOSIS — N401 Enlarged prostate with lower urinary tract symptoms: Secondary | ICD-10-CM

## 2024-07-16 DIAGNOSIS — N5201 Erectile dysfunction due to arterial insufficiency: Secondary | ICD-10-CM

## 2024-07-16 DIAGNOSIS — F339 Major depressive disorder, recurrent, unspecified: Secondary | ICD-10-CM

## 2024-07-17 NOTE — Telephone Encounter (Signed)
 Requested Prescriptions  Pending Prescriptions Disp Refills   sildenafil  (REVATIO ) 20 MG tablet [Pharmacy Med Name: SILDENAFIL  20 MG TABLET] 90 tablet 1    Sig: TAKE 3 TO 5 TABLETS BY MOUTH DAILY AS NEEDED     Urology: Erectile Dysfunction Agents Failed - 07/17/2024  1:15 PM      Failed - AST in normal range and within 360 days    AST  Date Value Ref Range Status  07/11/2023 40 (H) 10 - 35 U/L Final         Failed - ALT in normal range and within 360 days    ALT  Date Value Ref Range Status  07/11/2023 32 9 - 46 U/L Final         Passed - Last BP in normal range    BP Readings from Last 1 Encounters:  01/16/24 138/70         Passed - Valid encounter within last 12 months    Recent Outpatient Visits           6 months ago DM (diabetes mellitus), type 2 with neurological complications [facility])   [facility] [place], Marsa PARAS, DO               traZODone  (DESYREL ) 50 MG tablet [Pharmacy Med Name: traZODone  50 MG TABLET] 135 tablet     Sig: TAKE 1.5 TABLETS BY MOUTH AT BEDTIME     Psychiatry: Antidepressants - Serotonin Modulator Passed - 07/17/2024  1:15 PM      Passed - Completed PHQ-2 or PHQ-9 in the last 360 days      Passed - Valid encounter within last 6 months    Recent Outpatient Visits           6 months ago DM (diabetes mellitus), type 2 with neurological complications [facility])   [facility] [place], Marsa PARAS, DO               sertraline  (ZOLOFT ) 50 MG tablet [Pharmacy Med Name: SERTRALINE  HCL 50 MG TABLET] 90 tablet 0    Sig: TAKE 1 TABLET BY MOUTH DAILY     Psychiatry:  Antidepressants - SSRI - sertraline  Failed - 07/17/2024  1:15 PM      Failed - AST in normal range and within 360 days    AST  Date Value Ref Range Status  07/11/2023 40 (H) 10 - 35 U/L Final         Failed - ALT in normal range and within 360 days    ALT  Date Value Ref Range Status  07/11/2023 32 9 - 46 U/L Final          Passed - Completed PHQ-2 or PHQ-9 in the last 360 days      Passed - Valid encounter within last 6 months    Recent Outpatient Visits           6 months ago DM (diabetes mellitus), type 2 with neurological complications [facility])   [place] [facility] [place], Marsa PARAS, [place]

## 2024-07-22 ENCOUNTER — Other Ambulatory Visit: Payer: Self-pay | Admitting: Family Medicine

## 2024-07-22 DIAGNOSIS — I10 Essential (primary) hypertension: Secondary | ICD-10-CM

## 2024-07-23 ENCOUNTER — Encounter: Admitting: Family Medicine

## 2024-07-23 NOTE — Telephone Encounter (Signed)
 Requested Prescriptions  Pending Prescriptions Disp Refills   benazepril  (LOTENSIN ) 40 MG tablet [Pharmacy Med Name: BENAZEPRIL  HCL 40 MG TABLET] 90 tablet 0    Sig: TAKE 1 TABLET BY MOUTH DAILY     Cardiovascular:  ACE Inhibitors Failed - 07/23/2024  2:22 PM      Failed - Cr in normal range and within 180 days    Creat  Date Value Ref Range Status  07/11/2023 1.22 0.70 - 1.35 mg/dL Final   Creatinine, Urine  Date Value Ref Range Status  07/17/2023 145 20 - 320 mg/dL Final         Failed - K in normal range and within 180 days    Potassium  Date Value Ref Range Status  07/11/2023 4.9 3.5 - 5.3 mmol/L Final         Passed - Patient is not pregnant      Passed - Last BP in normal range    BP Readings from Last 1 Encounters:  01/16/24 138/70         Passed - Valid encounter within last 6 months    Recent Outpatient Visits           6 months ago DM (diabetes mellitus), type 2 with neurological complications [facility])   [facility] [place], Marsa PARAS, DO

## 2024-08-12 ENCOUNTER — Other Ambulatory Visit

## 2024-08-12 DIAGNOSIS — E1149 Type 2 diabetes mellitus with other diabetic neurological complication: Secondary | ICD-10-CM

## 2024-08-12 DIAGNOSIS — E1169 Type 2 diabetes mellitus with other specified complication: Secondary | ICD-10-CM

## 2024-08-12 DIAGNOSIS — D61818 Other pancytopenia: Secondary | ICD-10-CM

## 2024-08-12 DIAGNOSIS — Z0000 Encounter for general adult medical examination without abnormal findings: Secondary | ICD-10-CM

## 2024-08-12 DIAGNOSIS — I10 Essential (primary) hypertension: Secondary | ICD-10-CM

## 2024-08-12 LAB — CBC WITH DIFFERENTIAL/PLATELET
Absolute Lymphocytes: 735 {cells}/uL — ABNORMAL LOW (ref 850–3900)
Absolute Monocytes: 426 {cells}/uL (ref 200–950)
Basophils Absolute: 39 {cells}/uL (ref 0–200)
Basophils Relative: 0.9 %
Eosinophils Absolute: 181 {cells}/uL (ref 15–500)
Eosinophils Relative: 4.2 %
HCT: 41.3 % (ref 39.4–51.1)
Hemoglobin: 13.7 g/dL (ref 13.2–17.1)
MCH: 33.5 pg — ABNORMAL HIGH (ref 27.0–33.0)
MCHC: 33.2 g/dL (ref 31.6–35.4)
MCV: 101 fL (ref 81.4–101.7)
MPV: 10.8 fL (ref 7.5–12.5)
Monocytes Relative: 9.9 %
Neutro Abs: 2920 {cells}/uL (ref 1500–7800)
Neutrophils Relative %: 67.9 %
Platelets: 128 10*3/uL — ABNORMAL LOW (ref 140–400)
RBC: 4.09 Million/uL — ABNORMAL LOW (ref 4.20–5.80)
RDW: 13.3 % (ref 11.0–15.0)
Total Lymphocyte: 17.1 %
WBC: 4.3 10*3/uL (ref 3.8–10.8)

## 2024-08-12 LAB — MICROALBUMIN / CREATININE URINE RATIO
Creatinine, Urine: 83 mg/dL (ref 20–320)
Microalb Creat Ratio: 253 mg/g{creat} — ABNORMAL HIGH
Microalb, Ur: 21 mg/dL

## 2024-08-12 LAB — COMPREHENSIVE METABOLIC PANEL WITH GFR
AG Ratio: 1.2 (calc) (ref 1.0–2.5)
ALT: 46 U/L (ref 9–46)
AST: 74 U/L — ABNORMAL HIGH (ref 10–35)
Albumin: 4.2 g/dL (ref 3.6–5.1)
Alkaline phosphatase (APISO): 121 U/L (ref 35–144)
BUN: 14 mg/dL (ref 7–25)
CO2: 29 mmol/L (ref 20–32)
Calcium: 9.1 mg/dL (ref 8.6–10.3)
Chloride: 100 mmol/L (ref 98–110)
Creat: 1.1 mg/dL (ref 0.70–1.35)
Globulin: 3.4 g/dL (ref 1.9–3.7)
Glucose, Bld: 229 mg/dL — ABNORMAL HIGH (ref 65–99)
Potassium: 5.1 mmol/L (ref 3.5–5.3)
Sodium: 136 mmol/L (ref 135–146)
Total Bilirubin: 0.8 mg/dL (ref 0.2–1.2)
Total Protein: 7.6 g/dL (ref 6.1–8.1)
eGFR: 73 mL/min/{1.73_m2}

## 2024-08-12 LAB — LIPID PANEL
Cholesterol: 160 mg/dL
HDL: 58 mg/dL
LDL Cholesterol (Calc): 83 mg/dL
Non-HDL Cholesterol (Calc): 102 mg/dL
Total CHOL/HDL Ratio: 2.8 (calc)
Triglycerides: 94 mg/dL

## 2024-08-12 LAB — HEMOGLOBIN A1C
Hgb A1c MFr Bld: 6.9 % — ABNORMAL HIGH
Mean Plasma Glucose: 151 mg/dL
eAG (mmol/L): 8.4 mmol/L

## 2024-08-12 LAB — TSH: TSH: 3.6 m[IU]/L (ref 0.40–4.50)

## 2024-08-19 ENCOUNTER — Encounter: Admitting: Family Medicine

## 2025-03-26 ENCOUNTER — Ambulatory Visit
